# Patient Record
Sex: Female | Born: 1945
Health system: Southern US, Community
[De-identification: ages and names within clinical notes are randomized; demographics above are authoritative.]

## PROBLEM LIST (undated history)

## (undated) DIAGNOSIS — E7800 Pure hypercholesterolemia, unspecified: Secondary | ICD-10-CM

## (undated) DIAGNOSIS — R12 Heartburn: Secondary | ICD-10-CM

## (undated) DIAGNOSIS — K5901 Slow transit constipation: Secondary | ICD-10-CM

## (undated) DIAGNOSIS — F329 Major depressive disorder, single episode, unspecified: Secondary | ICD-10-CM

## (undated) DIAGNOSIS — L719 Rosacea, unspecified: Secondary | ICD-10-CM

## (undated) DIAGNOSIS — J301 Allergic rhinitis due to pollen: Secondary | ICD-10-CM

## (undated) DIAGNOSIS — E785 Hyperlipidemia, unspecified: Secondary | ICD-10-CM

## (undated) DIAGNOSIS — N3281 Overactive bladder: Secondary | ICD-10-CM

## (undated) DIAGNOSIS — R739 Hyperglycemia, unspecified: Secondary | ICD-10-CM

## (undated) DIAGNOSIS — R7303 Prediabetes: Secondary | ICD-10-CM

## (undated) DIAGNOSIS — L570 Actinic keratosis: Secondary | ICD-10-CM

## (undated) DIAGNOSIS — N390 Urinary tract infection, site not specified: Secondary | ICD-10-CM

## (undated) DIAGNOSIS — Z8616 Personal history of COVID-19: Secondary | ICD-10-CM

## (undated) DIAGNOSIS — K7581 Nonalcoholic steatohepatitis (NASH): Secondary | ICD-10-CM

## (undated) DIAGNOSIS — E538 Deficiency of other specified B group vitamins: Secondary | ICD-10-CM

## (undated) DIAGNOSIS — Z860100 Personal history of colon polyps, unspecified: Secondary | ICD-10-CM

## (undated) DIAGNOSIS — E559 Vitamin D deficiency, unspecified: Secondary | ICD-10-CM

## (undated) DIAGNOSIS — I4510 Unspecified right bundle-branch block: Secondary | ICD-10-CM

## (undated) DIAGNOSIS — K921 Melena: Secondary | ICD-10-CM

## (undated) DIAGNOSIS — F32A Depression, unspecified: Secondary | ICD-10-CM

## (undated) DIAGNOSIS — F411 Generalized anxiety disorder: Secondary | ICD-10-CM

## (undated) DIAGNOSIS — I679 Cerebrovascular disease, unspecified: Secondary | ICD-10-CM

## (undated) DIAGNOSIS — K219 Gastro-esophageal reflux disease without esophagitis: Secondary | ICD-10-CM

## (undated) DIAGNOSIS — M5431 Sciatica, right side: Secondary | ICD-10-CM

## (undated) DIAGNOSIS — F419 Anxiety disorder, unspecified: Secondary | ICD-10-CM

## (undated) HISTORY — DX: Personal history of colonic polyps: Z86.010

## (undated) HISTORY — DX: Personal history of COVID-19: Z86.16

## (undated) HISTORY — DX: Pure hypercholesterolemia, unspecified: E78.00

## (undated) HISTORY — DX: Personal history of colon polyps, unspecified: Z86.0100

## (undated) HISTORY — DX: Slow transit constipation: K59.01

## (undated) HISTORY — DX: Actinic keratosis: L57.0

## (undated) HISTORY — DX: Prediabetes: R73.03

## (undated) HISTORY — DX: Vitamin D deficiency, unspecified: E55.9

## (undated) HISTORY — DX: Gastro-esophageal reflux disease without esophagitis: K21.9

## (undated) HISTORY — DX: Rosacea, unspecified: L71.9

## (undated) HISTORY — PX: HYSTEROTOMY: SHX1776

## (undated) HISTORY — DX: Major depressive disorder, single episode, unspecified: F32.9

## (undated) HISTORY — DX: Nonalcoholic steatohepatitis (NASH): K75.81

## (undated) HISTORY — DX: Deficiency of other specified B group vitamins: E53.8

## (undated) HISTORY — DX: Overactive bladder: N32.81

## (undated) HISTORY — DX: Cerebrovascular disease, unspecified: I67.9

## (undated) HISTORY — DX: Hyperglycemia, unspecified: R73.9

## (undated) HISTORY — DX: Generalized anxiety disorder: F41.1

## (undated) HISTORY — DX: Allergic rhinitis due to pollen: J30.1

## (undated) HISTORY — DX: Heartburn: R12

## (undated) HISTORY — DX: Melena: K92.1

## (undated) HISTORY — DX: Sciatica, right side: M54.31

## (undated) HISTORY — DX: Hyperlipidemia, unspecified: E78.5

## (undated) HISTORY — DX: Unspecified right bundle-branch block: I45.10

---

## 2004-09-02 ENCOUNTER — Ambulatory Visit (HOSPITAL_COMMUNITY): Admission: RE | Admit: 2004-09-02 | Discharge: 2004-09-02 | Payer: Self-pay | Admitting: Family Medicine

## 2005-05-20 ENCOUNTER — Emergency Department (HOSPITAL_COMMUNITY): Admission: EM | Admit: 2005-05-20 | Discharge: 2005-05-20 | Payer: Self-pay | Admitting: Emergency Medicine

## 2005-05-21 ENCOUNTER — Encounter: Admission: RE | Admit: 2005-05-21 | Discharge: 2005-05-21 | Payer: Self-pay | Admitting: Emergency Medicine

## 2005-05-24 ENCOUNTER — Encounter: Admission: RE | Admit: 2005-05-24 | Discharge: 2005-05-24 | Payer: Self-pay | Admitting: Emergency Medicine

## 2005-05-27 ENCOUNTER — Ambulatory Visit (HOSPITAL_COMMUNITY): Admission: RE | Admit: 2005-05-27 | Discharge: 2005-05-27 | Payer: Self-pay | Admitting: Gastroenterology

## 2005-09-01 ENCOUNTER — Encounter: Admission: RE | Admit: 2005-09-01 | Discharge: 2005-09-01 | Payer: Self-pay | Admitting: Orthopedic Surgery

## 2005-12-20 ENCOUNTER — Ambulatory Visit (HOSPITAL_COMMUNITY): Admission: RE | Admit: 2005-12-20 | Discharge: 2005-12-20 | Payer: Self-pay | Admitting: Family Medicine

## 2006-01-05 ENCOUNTER — Encounter: Admission: RE | Admit: 2006-01-05 | Discharge: 2006-01-05 | Payer: Self-pay | Admitting: Family Medicine

## 2006-12-23 ENCOUNTER — Ambulatory Visit (HOSPITAL_COMMUNITY): Admission: RE | Admit: 2006-12-23 | Discharge: 2006-12-23 | Payer: Self-pay | Admitting: Family Medicine

## 2007-03-07 ENCOUNTER — Encounter: Admission: RE | Admit: 2007-03-07 | Discharge: 2007-03-07 | Payer: Self-pay | Admitting: Family Medicine

## 2008-01-12 ENCOUNTER — Ambulatory Visit (HOSPITAL_COMMUNITY): Admission: RE | Admit: 2008-01-12 | Discharge: 2008-01-12 | Payer: Self-pay

## 2009-01-15 ENCOUNTER — Ambulatory Visit (HOSPITAL_COMMUNITY): Admission: RE | Admit: 2009-01-15 | Discharge: 2009-01-15 | Payer: Self-pay | Admitting: Family Medicine

## 2010-01-19 ENCOUNTER — Ambulatory Visit (HOSPITAL_COMMUNITY): Admission: RE | Admit: 2010-01-19 | Discharge: 2010-01-19 | Payer: Self-pay | Admitting: Family Medicine

## 2010-09-30 ENCOUNTER — Other Ambulatory Visit: Payer: Self-pay | Admitting: Family Medicine

## 2010-09-30 DIAGNOSIS — Z1231 Encounter for screening mammogram for malignant neoplasm of breast: Secondary | ICD-10-CM

## 2010-11-04 ENCOUNTER — Other Ambulatory Visit: Payer: Self-pay | Admitting: Family Medicine

## 2010-11-06 ENCOUNTER — Ambulatory Visit
Admission: RE | Admit: 2010-11-06 | Discharge: 2010-11-06 | Disposition: A | Discharge status: Home / Self Care | Payer: PRIVATE HEALTH INSURANCE | Source: Ambulatory Visit | Attending: Family Medicine | Admitting: Family Medicine

## 2010-11-13 NOTE — Op Note (Signed)
[name], Diana Smith              ACCOUNT NO.:  [id number]   MEDICAL RECORD NO.:  [id number]          PATIENT TYPE:  AMB   LOCATION:  ENDO                         FACILITY:  [facility]   PHYSICIAN:  John C. Amedeo Plenty, M.D.    DATE OF BIRTH:  Jan 01, 1946   DATE OF PROCEDURE:  05/27/2005  DATE OF DISCHARGE:                                 OPERATIVE REPORT   PROCEDURE:  Endoscopic retrograde cholangiopancreatography with  sphincterotomy.   INDICATIONS FOR PROCEDURE:  A several day history of epigastric or abdominal  pain with CT scan showing a distal common bile duct stone near the ampulla.  She did have elevated liver function tests on presentation to the emergency  room on November 22.   DESCRIPTION OF PROCEDURE:  The patient was placed in the prone position then  placed on the pulse monitor with continuous low-flow oxygen delivered by  nasal cannula. She was sedated with 100 mcg IV fentanyl and 7 mg IV Versed.  The Olympus video side-viewing endoscope was advanced blindly into the  oropharynx, esophagus and stomach. The stomach looked grossly unremarkable,  the pylorus was traversed and Papilla of Vater located on the medial  duodenal wall. It had a normal appearance and was cannulated with the Alan Ripper sphincterotome with the aid of a guidewire. No pancreatogram was  obtained. A cholangiogram was obtained which showed a smooth modestly  dilated common bile duct with initial suggestion of a floating filling  defect that could not readily be kept in view while the dye was injected.  The dye did flow into the gallbladder and there were no obvious stones seen.  A generous sphincterotomy was made and a 12 mm balloon catheter was advanced  up into the common hepatic duct, inflated and dragged down. There appeared  to be a filling defect in front of the advancing balloon but no stone was  seen to be delivered and there was some air seen to come through the ampulla  endoscopically before the  balloon came through. Once this was done,  significant air was introduced into the biliary tree at that point. It could  not be readily cleared for the remainder of the procedure. More dye was  injected and two occlusion cholangiograms were obtained which showed no  obvious further filling defects. The balloon was swept through the duct 2 or  3 more times with a lot of air bubbles delivered but no stones seen. The  scope was then withdrawn and the patient returned to the recovery room in  stable condition. She tolerated the procedure well and there were no  immediate complications.   IMPRESSION:  1.  Mildly dilated common bile duct with initial suggestion of a filling      defect which could not be confirmed to be a stone status post empiric      sphincterotomy and balloon sweep.   PLAN:  Will recheck liver function test drawn prior to this procedure and  follow over time as well as follow the patient clinically for improvement.           ______________________________  John C. Amedeo Plenty, M.D.    JCH/MEDQ  D:  05/27/2005  T:  05/27/2005  Job:  [id number]   cc:   Zella Richer. Burnett Harry, M.D.  Fax: [phone]

## 2011-01-25 ENCOUNTER — Ambulatory Visit (HOSPITAL_COMMUNITY)
Admission: RE | Admit: 2011-01-25 | Discharge: 2011-01-25 | Disposition: A | Discharge status: Home / Self Care | Payer: PRIVATE HEALTH INSURANCE | Source: Ambulatory Visit | Attending: Family Medicine | Admitting: Family Medicine

## 2011-01-25 ENCOUNTER — Other Ambulatory Visit (HOSPITAL_COMMUNITY): Payer: Self-pay | Admitting: Family Medicine

## 2011-01-25 ENCOUNTER — Ambulatory Visit: Payer: Self-pay

## 2011-01-25 DIAGNOSIS — Z1231 Encounter for screening mammogram for malignant neoplasm of breast: Secondary | ICD-10-CM | POA: Insufficient documentation

## 2011-01-28 ENCOUNTER — Other Ambulatory Visit (HOSPITAL_COMMUNITY): Payer: Self-pay | Admitting: Gastroenterology

## 2011-01-28 DIAGNOSIS — R6881 Early satiety: Secondary | ICD-10-CM

## 2011-02-12 ENCOUNTER — Encounter (HOSPITAL_COMMUNITY)
Admission: RE | Admit: 2011-02-12 | Discharge: 2011-02-12 | Disposition: A | Discharge status: Home / Self Care | Payer: PRIVATE HEALTH INSURANCE | Source: Ambulatory Visit | Attending: Gastroenterology | Admitting: Gastroenterology

## 2011-02-12 DIAGNOSIS — R110 Nausea: Secondary | ICD-10-CM | POA: Insufficient documentation

## 2011-02-12 DIAGNOSIS — R109 Unspecified abdominal pain: Secondary | ICD-10-CM | POA: Insufficient documentation

## 2011-02-12 DIAGNOSIS — R6881 Early satiety: Secondary | ICD-10-CM | POA: Insufficient documentation

## 2011-02-12 MED ORDER — TECHNETIUM TC 99M SULFUR COLLOID
2.0000 | Freq: Once | INTRAVENOUS | Status: AC | PRN
  Administered 2011-02-12: 2 via INTRAVENOUS

## 2011-08-02 ENCOUNTER — Other Ambulatory Visit: Payer: Self-pay | Admitting: Family Medicine

## 2011-08-02 ENCOUNTER — Ambulatory Visit
Admission: RE | Admit: 2011-08-02 | Discharge: 2011-08-02 | Disposition: A | Discharge status: Home / Self Care | Payer: Medicare Other | Source: Ambulatory Visit | Attending: Family Medicine | Admitting: Family Medicine

## 2011-08-02 DIAGNOSIS — S0510XA Contusion of eyeball and orbital tissues, unspecified eye, initial encounter: Secondary | ICD-10-CM

## 2011-10-07 ENCOUNTER — Other Ambulatory Visit: Payer: Self-pay | Admitting: Orthopedic Surgery

## 2011-10-07 NOTE — Discharge Instructions (Signed)

## 2011-10-08 ENCOUNTER — Ambulatory Visit (HOSPITAL_BASED_OUTPATIENT_CLINIC_OR_DEPARTMENT_OTHER)
Admission: RE | Admit: 2011-10-08 | Discharge: 2011-10-08 | Disposition: A | Discharge status: Home / Self Care | Payer: Medicare Other | Source: Ambulatory Visit | Attending: Orthopedic Surgery | Admitting: Orthopedic Surgery

## 2011-10-08 ENCOUNTER — Encounter (HOSPITAL_BASED_OUTPATIENT_CLINIC_OR_DEPARTMENT_OTHER)
Admission: RE | Disposition: A | Discharge status: Home / Self Care | Payer: Self-pay | Source: Ambulatory Visit | Attending: Orthopedic Surgery

## 2011-10-08 DIAGNOSIS — B079 Viral wart, unspecified: Secondary | ICD-10-CM | POA: Insufficient documentation

## 2011-10-08 HISTORY — PX: MASS EXCISION: SHX2000

## 2011-10-08 SURGERY — MINOR EXCISION OF MASS
Anesthesia: LOCAL | Site: Hand | Laterality: Left | Wound class: Clean

## 2011-10-08 MED ORDER — CHLORHEXIDINE GLUCONATE 4 % EX LIQD: 60.0000 mL | Freq: Once | CUTANEOUS | Status: DC

## 2011-10-08 MED ORDER — LIDOCAINE HCL 2 % IJ SOLN
INTRAMUSCULAR | Status: DC | PRN
  Administered 2011-10-08: 2 mL

## 2011-10-08 SURGICAL SUPPLY — 36 items
BANDAGE ADHESIVE 1X3 (GAUZE/BANDAGES/DRESSINGS) IMPLANT
BLADE SURG 15 STRL LF DISP TIS (BLADE) ×1 IMPLANT
BLADE SURG 15 STRL SS (BLADE) ×2
BNDG CMPR 9X4 STRL LF SNTH (GAUZE/BANDAGES/DRESSINGS) ×1
BNDG CMPR MD 5X2 ELC HKLP STRL (GAUZE/BANDAGES/DRESSINGS)
BNDG COHESIVE 1X5 TAN STRL LF (GAUZE/BANDAGES/DRESSINGS) IMPLANT
BNDG ELASTIC 2 VLCR STRL LF (GAUZE/BANDAGES/DRESSINGS) IMPLANT
BNDG ESMARK 4X9 LF (GAUZE/BANDAGES/DRESSINGS) ×1 IMPLANT
BRUSH SCRUB EZ PLAIN DRY (MISCELLANEOUS) ×2 IMPLANT
CLOTH BEACON ORANGE TIMEOUT ST (SAFETY) ×2 IMPLANT
CORDS BIPOLAR (ELECTRODE) ×1 IMPLANT
COVER MAYO STAND STRL (DRAPES) ×2 IMPLANT
CUFF TOURNIQUET SINGLE 18IN (TOURNIQUET CUFF) IMPLANT
DECANTER SPIKE VIAL GLASS SM (MISCELLANEOUS) ×1 IMPLANT
DRAIN PENROSE 1/2X12 LTX STRL (WOUND CARE) IMPLANT
DRAPE SURG 17X23 STRL (DRAPES) ×2 IMPLANT
GAUZE SPONGE 4X4 12PLY STRL LF (GAUZE/BANDAGES/DRESSINGS) ×4 IMPLANT
GAUZE XEROFORM 1X8 LF (GAUZE/BANDAGES/DRESSINGS) ×1 IMPLANT
GLOVE BIO SURGEON STRL SZ 6.5 (GLOVE) ×2 IMPLANT
GLOVE BIOGEL M STRL SZ7.5 (GLOVE) ×2 IMPLANT
GLOVE ORTHO TXT STRL SZ7.5 (GLOVE) ×2 IMPLANT
GOWN PREVENTION PLUS XLARGE (GOWN DISPOSABLE) ×4 IMPLANT
NEEDLE 27GAX1X1/2 (NEEDLE) ×1 IMPLANT
PACK BASIN DAY SURGERY FS (CUSTOM PROCEDURE TRAY) ×2 IMPLANT
PADDING CAST ABS 4INX4YD NS (CAST SUPPLIES) ×1
PADDING CAST ABS COTTON 4X4 ST (CAST SUPPLIES) ×1 IMPLANT
SPONGE GAUZE 4X4 12PLY (GAUZE/BANDAGES/DRESSINGS) ×2 IMPLANT
STOCKINETTE 4X48 STRL (DRAPES) ×2 IMPLANT
SUT ETHILON 5 0 P 3 18 (SUTURE) ×1
SUT NYLON ETHILON 5-0 P-3 1X18 (SUTURE) ×1 IMPLANT
SYR 3ML 23GX1 SAFETY (SYRINGE) IMPLANT
SYR CONTROL 10ML LL (SYRINGE) ×1 IMPLANT
TOWEL OR 17X24 6PK STRL BLUE (TOWEL DISPOSABLE) ×4 IMPLANT
TRAY DSU PREP LF (CUSTOM PROCEDURE TRAY) ×2 IMPLANT
UNDERPAD 30X30 INCONTINENT (UNDERPADS AND DIAPERS) ×2 IMPLANT
WATER STERILE IRR 1000ML POUR (IV SOLUTION) ×2 IMPLANT

## 2011-10-08 NOTE — Brief Op Note (Signed)
10/08/2011  12:02 PM  PATIENT:  Diana Smith  66 y.o. female  PRE-OPERATIVE DIAGNOSIS:  mass dorsal left long metacarpal phalangeal joint  POST-OPERATIVE DIAGNOSIS:  rule out keratoacanthoma  PROCEDURE: WIDE MARGINAL EXCISION OF CUTANEOUS MASS RIGHT DORSAL HAND  SURGEON: Cammie Sickle., MD   PHYSICIAN ASSISTANT:   ASSISTANTS: Kathyrn Sheriff.A-C    ANESTHESIA:   local  EBL:     BLOOD ADMINISTERED:none  DRAINS: none   LOCAL MEDICATIONS USED:  XYLOCAINE   SPECIMEN:  Excision  DISPOSITION OF SPECIMEN:  PATHOLOGY  COUNTS:  YES  TOURNIQUET:   Total Tourniquet Time Documented: Forearm (Left) - 4 minutes  DICTATION: .Other Dictation: Dictation Number [id number]  PLAN OF CARE: Discharge to home after PACU  PATIENT DISPOSITION:  PACU - hemodynamically stable.

## 2011-10-08 NOTE — H&P (Signed)
  Patient is a 66 year old right-hand-dominant female who presented to our office on 10/06/2011 for evaluation and treatment of an enlarging mass on the dorsal aspect of her left long finger MCP joint. She stated that she first noted this in early January of 2013. It started as what appeared to be a small pimple and then eventually open. She states that she had occasional of bleeding from it. If she hit it against something at her home. She was seen by her primary care physician and referred to our office for further evaluation and treatment. Review of her past medical history reveals allergies to Dynacin and Lorabid. Review of systems reveals that she wears contact lenses. Current medications include Paxil 30 mg per day and BuSpar 35 mg tablets per day. Past surgical history reveals that surgery, E. September 1995, hysterectomy in April of 1999. He's had no adverse reactions to anesthesia. Family history reveals no history of heart disease. There is history of hypertension, and arthritis. Social history reveals patient does not smoke or consume alcohol. Based beverages. She is married and lives with her husband in [place], [place]. Physical examination reveals a 1 cm mass, dorsum of her right long finger MCP joint. Does not appear to be infected. She has full range of motion of her fingers and wrists.Neurovascular exam is intact.  Impression: Possible filiform verrucous vulgaris vs. Possible early skin cancer.  Plan:  To OR for excisional biopsy of mass dorsum left long finger MCP joint. The procedure, risks and post-op course were discussed with the patient at length and she was in agreement with the plan.  Ronnae Kaser Dasnoit PA-C  H&P documentation: 10/08/2011  -History and Physical Reviewed  -Patient has been re-examined  -No change in the plan of care  Cammie Sickle, MD

## 2011-10-08 NOTE — Op Note (Signed)
OP NOTE DICTATED [id number]

## 2011-10-11 ENCOUNTER — Encounter (HOSPITAL_BASED_OUTPATIENT_CLINIC_OR_DEPARTMENT_OTHER): Payer: Self-pay | Admitting: Orthopedic Surgery

## 2011-10-11 NOTE — Op Note (Signed)
NAME:  Diana Smith, Diana Smith                    ACCOUNT NO.:  MEDICAL RECORD NO.:  [id number]  LOCATION:                                 FACILITY:  PHYSICIAN:  Youlanda Mighty. Arilla Hice, M.D.      DATE OF BIRTH:  DATE OF PROCEDURE:  10/08/2011 DATE OF DISCHARGE:                              OPERATIVE REPORT   PREOPERATIVE DIAGNOSIS:  Enlarging cutaneous mass, left hand over the dorsal aspect of long finger metacarpophalangeal joint.  POSTOPERATIVE DIAGNOSIS:  Enlarging cutaneous mass, left hand over the dorsal aspect of long finger metacarpophalangeal joint.  OPERATION:  Wide marginal resection of cutaneous mass with primary skin closure.  OPERATING SURGEON:  Youlanda Mighty. Aspasia Rude, MD  ASSISTANT:  Marily Lente Dasnoit, PA-C  ANESTHESIA:  2% lidocaine field block, this was performed as a minor operating room procedure.  INDICATIONS:  Diana Smith is a 66 year old woman self referred for evaluation of a cutaneous mass that has been growing rapidly during the past 2 months, just 1 cm in diameter, hyperkeratotic, exophytic mass that had been bleeding.  She presented for evaluation.  This had the appearance of a possible keratoacanthoma or squamous cell skin cancer. Therefore, we recommended wide marginal resection for diagnosis and hopeful resolution. After informed consent, she was brought to the operating room at this time.  PROCEDURE:  Diana Smith was brought to room #1 of the [facility] and placed in supine position on the operating table.  Following informed consent and Betadine prep, 2% lidocaine was infiltrated in the path of the intended incision and margins.  After 5 minutes, excellent anesthesia was achieved.  The left hand and arm were then prepped with Betadine soap solution and sterilely draped.  A pneumatic tourniquet was applied to the proximal forearm.  Following routine surgical time-out, the hand was exsanguinated as was the forearm followed by inflation of the  arterial tourniquet to 220 mmHg.  A wide marginal resection was planned with 5-mm margins.  The skin margins were taken sharply and the mass and margins were excised en bloc.  This was placed in formalin for pathologic evaluation.  Skin margins were undermined followed by layered closure with subcutaneous 4-0 Vicryl and intradermal 3-0 Prolene with Steri-Strips.  The voluminous gauze dressing was applied with Ace wrap.  For aftercare, Ms. Kass was provided prescription for Vicodin 5 mg 1 p.o. q.4-6 hours p.r.n. pain, 20 tabs without refill.  We will see her back in followup in our office in a week for follow up or sooner p.r.n. problems.     Youlanda Mighty Jonus Coble, M.D.     RVS/MEDQ  D:  10/08/2011  T:  10/08/2011  Job:  [id number]

## 2012-01-07 ENCOUNTER — Other Ambulatory Visit (HOSPITAL_COMMUNITY): Payer: Self-pay | Admitting: Family Medicine

## 2012-01-07 DIAGNOSIS — Z1231 Encounter for screening mammogram for malignant neoplasm of breast: Secondary | ICD-10-CM

## 2012-02-01 ENCOUNTER — Ambulatory Visit (HOSPITAL_COMMUNITY)
Admission: RE | Admit: 2012-02-01 | Discharge: 2012-02-01 | Disposition: A | Discharge status: Home / Self Care | Payer: Medicare Other | Source: Ambulatory Visit | Attending: Family Medicine | Admitting: Family Medicine

## 2012-02-01 DIAGNOSIS — Z1231 Encounter for screening mammogram for malignant neoplasm of breast: Secondary | ICD-10-CM | POA: Insufficient documentation

## 2013-01-10 ENCOUNTER — Other Ambulatory Visit: Payer: Self-pay | Admitting: Gastroenterology

## 2013-01-10 DIAGNOSIS — R109 Unspecified abdominal pain: Secondary | ICD-10-CM

## 2013-01-11 ENCOUNTER — Ambulatory Visit
Admission: RE | Admit: 2013-01-11 | Discharge: 2013-01-11 | Disposition: A | Discharge status: Home / Self Care | Payer: Medicare Other | Source: Ambulatory Visit | Attending: Gastroenterology | Admitting: Gastroenterology

## 2013-01-11 DIAGNOSIS — R109 Unspecified abdominal pain: Secondary | ICD-10-CM

## 2013-02-06 ENCOUNTER — Other Ambulatory Visit (HOSPITAL_COMMUNITY): Payer: Self-pay | Admitting: Family Medicine

## 2013-02-06 DIAGNOSIS — Z1231 Encounter for screening mammogram for malignant neoplasm of breast: Secondary | ICD-10-CM

## 2013-02-09 ENCOUNTER — Ambulatory Visit (HOSPITAL_COMMUNITY)
Admission: RE | Admit: 2013-02-09 | Discharge: 2013-02-09 | Disposition: A | Discharge status: Home / Self Care | Payer: Medicare Other | Source: Ambulatory Visit | Attending: Family Medicine | Admitting: Family Medicine

## 2013-02-09 DIAGNOSIS — Z1231 Encounter for screening mammogram for malignant neoplasm of breast: Secondary | ICD-10-CM | POA: Insufficient documentation

## 2014-03-28 ENCOUNTER — Other Ambulatory Visit: Payer: Self-pay | Admitting: Dermatology

## 2014-04-15 ENCOUNTER — Other Ambulatory Visit (HOSPITAL_COMMUNITY): Payer: Self-pay | Admitting: Family Medicine

## 2014-04-15 DIAGNOSIS — Z1231 Encounter for screening mammogram for malignant neoplasm of breast: Secondary | ICD-10-CM

## 2014-05-09 ENCOUNTER — Ambulatory Visit (HOSPITAL_COMMUNITY)
Admission: RE | Admit: 2014-05-09 | Discharge: 2014-05-09 | Disposition: A | Discharge status: Home / Self Care | Payer: Medicare HMO | Source: Ambulatory Visit | Attending: Family Medicine | Admitting: Family Medicine

## 2014-05-09 DIAGNOSIS — Z1231 Encounter for screening mammogram for malignant neoplasm of breast: Secondary | ICD-10-CM | POA: Insufficient documentation

## 2014-10-24 ENCOUNTER — Ambulatory Visit
Admission: RE | Admit: 2014-10-24 | Discharge: 2014-10-24 | Disposition: A | Discharge status: Home / Self Care | Payer: Commercial Managed Care - HMO | Source: Ambulatory Visit | Attending: Family Medicine | Admitting: Family Medicine

## 2014-10-24 ENCOUNTER — Other Ambulatory Visit: Payer: Self-pay | Admitting: Family Medicine

## 2014-10-24 DIAGNOSIS — S6990XA Unspecified injury of unspecified wrist, hand and finger(s), initial encounter: Secondary | ICD-10-CM

## 2014-10-24 DIAGNOSIS — R109 Unspecified abdominal pain: Secondary | ICD-10-CM | POA: Diagnosis not present

## 2014-10-24 DIAGNOSIS — Z87828 Personal history of other (healed) physical injury and trauma: Secondary | ICD-10-CM | POA: Diagnosis not present

## 2014-10-24 DIAGNOSIS — R197 Diarrhea, unspecified: Secondary | ICD-10-CM | POA: Diagnosis not present

## 2014-10-24 DIAGNOSIS — M19031 Primary osteoarthritis, right wrist: Secondary | ICD-10-CM | POA: Diagnosis not present

## 2014-10-24 DIAGNOSIS — M25531 Pain in right wrist: Secondary | ICD-10-CM | POA: Diagnosis not present

## 2014-12-23 DIAGNOSIS — M5489 Other dorsalgia: Secondary | ICD-10-CM | POA: Diagnosis not present

## 2014-12-23 DIAGNOSIS — R3915 Urgency of urination: Secondary | ICD-10-CM | POA: Diagnosis not present

## 2014-12-23 DIAGNOSIS — R350 Frequency of micturition: Secondary | ICD-10-CM | POA: Diagnosis not present

## 2014-12-31 DIAGNOSIS — W06XXXA Fall from bed, initial encounter: Secondary | ICD-10-CM | POA: Diagnosis not present

## 2014-12-31 DIAGNOSIS — S0083XA Contusion of other part of head, initial encounter: Secondary | ICD-10-CM | POA: Diagnosis not present

## 2015-01-01 ENCOUNTER — Ambulatory Visit
Admission: RE | Admit: 2015-01-01 | Discharge: 2015-01-01 | Disposition: A | Discharge status: Home / Self Care | Payer: Commercial Managed Care - HMO | Source: Ambulatory Visit | Attending: Family Medicine | Admitting: Family Medicine

## 2015-01-01 ENCOUNTER — Other Ambulatory Visit: Payer: Self-pay | Admitting: Family Medicine

## 2015-01-01 DIAGNOSIS — S0083XA Contusion of other part of head, initial encounter: Secondary | ICD-10-CM

## 2015-01-01 DIAGNOSIS — M1711 Unilateral primary osteoarthritis, right knee: Secondary | ICD-10-CM | POA: Diagnosis not present

## 2015-01-01 DIAGNOSIS — M79644 Pain in right finger(s): Secondary | ICD-10-CM | POA: Diagnosis not present

## 2015-07-23 ENCOUNTER — Other Ambulatory Visit: Payer: Self-pay

## 2015-07-23 DIAGNOSIS — Z1231 Encounter for screening mammogram for malignant neoplasm of breast: Secondary | ICD-10-CM

## 2015-08-08 ENCOUNTER — Ambulatory Visit: Payer: Commercial Managed Care - HMO

## 2015-08-20 ENCOUNTER — Ambulatory Visit
Admission: RE | Admit: 2015-08-20 | Discharge: 2015-08-20 | Disposition: A | Discharge status: Home / Self Care | Payer: PPO | Source: Ambulatory Visit

## 2015-08-20 DIAGNOSIS — Z1231 Encounter for screening mammogram for malignant neoplasm of breast: Secondary | ICD-10-CM

## 2015-11-20 DIAGNOSIS — F0390 Unspecified dementia without behavioral disturbance: Secondary | ICD-10-CM | POA: Diagnosis not present

## 2015-11-20 DIAGNOSIS — Z79899 Other long term (current) drug therapy: Secondary | ICD-10-CM | POA: Diagnosis not present

## 2015-11-20 DIAGNOSIS — E7800 Pure hypercholesterolemia, unspecified: Secondary | ICD-10-CM | POA: Diagnosis not present

## 2015-11-20 DIAGNOSIS — E559 Vitamin D deficiency, unspecified: Secondary | ICD-10-CM | POA: Diagnosis not present

## 2015-11-20 DIAGNOSIS — K219 Gastro-esophageal reflux disease without esophagitis: Secondary | ICD-10-CM | POA: Diagnosis not present

## 2015-11-20 DIAGNOSIS — F324 Major depressive disorder, single episode, in partial remission: Secondary | ICD-10-CM | POA: Diagnosis not present

## 2015-12-03 DIAGNOSIS — E538 Deficiency of other specified B group vitamins: Secondary | ICD-10-CM | POA: Diagnosis not present

## 2015-12-16 DIAGNOSIS — E538 Deficiency of other specified B group vitamins: Secondary | ICD-10-CM | POA: Diagnosis not present

## 2016-01-20 DIAGNOSIS — E538 Deficiency of other specified B group vitamins: Secondary | ICD-10-CM | POA: Diagnosis not present

## 2016-01-23 DIAGNOSIS — M25571 Pain in right ankle and joints of right foot: Secondary | ICD-10-CM | POA: Diagnosis not present

## 2016-03-02 DIAGNOSIS — E559 Vitamin D deficiency, unspecified: Secondary | ICD-10-CM | POA: Diagnosis not present

## 2016-05-24 DIAGNOSIS — L821 Other seborrheic keratosis: Secondary | ICD-10-CM | POA: Diagnosis not present

## 2016-05-24 DIAGNOSIS — L814 Other melanin hyperpigmentation: Secondary | ICD-10-CM | POA: Diagnosis not present

## 2016-05-24 DIAGNOSIS — D225 Melanocytic nevi of trunk: Secondary | ICD-10-CM | POA: Diagnosis not present

## 2016-05-24 DIAGNOSIS — Z85828 Personal history of other malignant neoplasm of skin: Secondary | ICD-10-CM | POA: Diagnosis not present

## 2016-05-27 DIAGNOSIS — D126 Benign neoplasm of colon, unspecified: Secondary | ICD-10-CM | POA: Diagnosis not present

## 2016-05-27 DIAGNOSIS — Z86010 Personal history of colonic polyps: Secondary | ICD-10-CM | POA: Diagnosis not present

## 2016-05-27 DIAGNOSIS — D122 Benign neoplasm of ascending colon: Secondary | ICD-10-CM | POA: Diagnosis not present

## 2016-06-01 DIAGNOSIS — D126 Benign neoplasm of colon, unspecified: Secondary | ICD-10-CM | POA: Diagnosis not present

## 2016-07-20 DIAGNOSIS — M545 Low back pain: Secondary | ICD-10-CM | POA: Diagnosis not present

## 2016-07-20 DIAGNOSIS — R6889 Other general symptoms and signs: Secondary | ICD-10-CM | POA: Diagnosis not present

## 2016-12-09 DIAGNOSIS — J301 Allergic rhinitis due to pollen: Secondary | ICD-10-CM | POA: Diagnosis not present

## 2016-12-09 DIAGNOSIS — L659 Nonscarring hair loss, unspecified: Secondary | ICD-10-CM | POA: Diagnosis not present

## 2016-12-09 DIAGNOSIS — F419 Anxiety disorder, unspecified: Secondary | ICD-10-CM | POA: Diagnosis not present

## 2017-03-03 DIAGNOSIS — B850 Pediculosis due to Pediculus humanus capitis: Secondary | ICD-10-CM | POA: Diagnosis not present

## 2017-03-03 DIAGNOSIS — Z23 Encounter for immunization: Secondary | ICD-10-CM | POA: Diagnosis not present

## 2017-04-04 ENCOUNTER — Other Ambulatory Visit: Payer: Self-pay | Admitting: Family Medicine

## 2017-04-04 DIAGNOSIS — Z1239 Encounter for other screening for malignant neoplasm of breast: Secondary | ICD-10-CM

## 2017-04-21 ENCOUNTER — Ambulatory Visit
Admission: RE | Admit: 2017-04-21 | Discharge: 2017-04-21 | Disposition: A | Discharge status: Home / Self Care | Payer: PPO | Source: Ambulatory Visit | Attending: Family Medicine | Admitting: Family Medicine

## 2017-04-21 DIAGNOSIS — Z1239 Encounter for other screening for malignant neoplasm of breast: Secondary | ICD-10-CM

## 2017-04-21 DIAGNOSIS — Z1231 Encounter for screening mammogram for malignant neoplasm of breast: Secondary | ICD-10-CM | POA: Diagnosis not present

## 2017-05-24 DIAGNOSIS — D692 Other nonthrombocytopenic purpura: Secondary | ICD-10-CM | POA: Diagnosis not present

## 2017-05-24 DIAGNOSIS — D229 Melanocytic nevi, unspecified: Secondary | ICD-10-CM | POA: Diagnosis not present

## 2017-05-24 DIAGNOSIS — L821 Other seborrheic keratosis: Secondary | ICD-10-CM | POA: Diagnosis not present

## 2017-05-24 DIAGNOSIS — L814 Other melanin hyperpigmentation: Secondary | ICD-10-CM | POA: Diagnosis not present

## 2017-07-04 DIAGNOSIS — F324 Major depressive disorder, single episode, in partial remission: Secondary | ICD-10-CM | POA: Diagnosis not present

## 2017-07-04 DIAGNOSIS — K219 Gastro-esophageal reflux disease without esophagitis: Secondary | ICD-10-CM | POA: Diagnosis not present

## 2017-09-01 DIAGNOSIS — H524 Presbyopia: Secondary | ICD-10-CM | POA: Diagnosis not present

## 2017-09-01 DIAGNOSIS — Z0100 Encounter for examination of eyes and vision without abnormal findings: Secondary | ICD-10-CM | POA: Diagnosis not present

## 2017-10-07 DIAGNOSIS — W06XXXA Fall from bed, initial encounter: Secondary | ICD-10-CM | POA: Diagnosis not present

## 2017-10-07 DIAGNOSIS — S0011XA Contusion of right eyelid and periocular area, initial encounter: Secondary | ICD-10-CM | POA: Diagnosis not present

## 2017-10-07 DIAGNOSIS — J309 Allergic rhinitis, unspecified: Secondary | ICD-10-CM | POA: Diagnosis not present

## 2017-10-07 DIAGNOSIS — S0081XA Abrasion of other part of head, initial encounter: Secondary | ICD-10-CM | POA: Diagnosis not present

## 2017-11-08 DIAGNOSIS — G479 Sleep disorder, unspecified: Secondary | ICD-10-CM | POA: Diagnosis not present

## 2017-11-08 DIAGNOSIS — I4510 Unspecified right bundle-branch block: Secondary | ICD-10-CM | POA: Diagnosis not present

## 2017-11-08 DIAGNOSIS — E559 Vitamin D deficiency, unspecified: Secondary | ICD-10-CM | POA: Diagnosis not present

## 2017-11-08 DIAGNOSIS — Z0000 Encounter for general adult medical examination without abnormal findings: Secondary | ICD-10-CM | POA: Diagnosis not present

## 2017-11-08 DIAGNOSIS — Z23 Encounter for immunization: Secondary | ICD-10-CM | POA: Diagnosis not present

## 2017-11-08 DIAGNOSIS — E7800 Pure hypercholesterolemia, unspecified: Secondary | ICD-10-CM | POA: Diagnosis not present

## 2017-11-08 DIAGNOSIS — Z1159 Encounter for screening for other viral diseases: Secondary | ICD-10-CM | POA: Diagnosis not present

## 2017-11-08 DIAGNOSIS — E538 Deficiency of other specified B group vitamins: Secondary | ICD-10-CM | POA: Diagnosis not present

## 2017-11-08 DIAGNOSIS — Z79899 Other long term (current) drug therapy: Secondary | ICD-10-CM | POA: Diagnosis not present

## 2017-11-15 DIAGNOSIS — S93504A Unspecified sprain of right lesser toe(s), initial encounter: Secondary | ICD-10-CM | POA: Diagnosis not present

## 2017-11-15 DIAGNOSIS — J069 Acute upper respiratory infection, unspecified: Secondary | ICD-10-CM | POA: Diagnosis not present

## 2017-11-25 ENCOUNTER — Encounter: Payer: Self-pay | Admitting: Family Medicine

## 2017-11-25 DIAGNOSIS — R7303 Prediabetes: Secondary | ICD-10-CM | POA: Diagnosis not present

## 2017-11-25 DIAGNOSIS — E538 Deficiency of other specified B group vitamins: Secondary | ICD-10-CM | POA: Diagnosis not present

## 2018-01-09 DIAGNOSIS — E2839 Other primary ovarian failure: Secondary | ICD-10-CM | POA: Diagnosis not present

## 2018-01-09 DIAGNOSIS — M8588 Other specified disorders of bone density and structure, other site: Secondary | ICD-10-CM | POA: Diagnosis not present

## 2018-01-31 ENCOUNTER — Encounter: Payer: PPO | Attending: Family Medicine | Admitting: Nutrition

## 2018-01-31 VITALS — Ht 65.5 in | Wt 174.0 lb

## 2018-01-31 DIAGNOSIS — R739 Hyperglycemia, unspecified: Secondary | ICD-10-CM

## 2018-01-31 NOTE — Progress Notes (Signed)
  Medical Nutrition Therapy:  Appt start time: 0930 end time:  1030.   Assessment:  Primary concerns today: .Prediabetes.  PMH: HTN,Depression, GERD and low VIt D and possible low B12?  She notes her A1C was elevated but I don't have her labs. BMI  Is 28.5. She would like to weigh around 150 lbs. LIves with her husband. Wants to lose weight. Eats 2-3 meals per day. Usually skips breakfast and eats lunch and larger dinner and then snacks in late afternoon and at night. Not physically active. Cooks some meals at home and eats out for convenience. Current diet exceeds her needs and contributes to weight gain and elevated blood sugars.  Willing to make changes with her diet and exercise to improve her health.   Preferred Learning Style:   No preference indicated   Learning Readiness:  Ready  Change in progress   MEDICATIONS:   DIETARY INTAKE:  24-hr recall:  B ( AM):  Sausage and egg biscuit 1;  Or skips Snk ( AM):  L ( PM): misc.  skips Snk ( PM):  D ( PM): spaghetti,  Salad, Water or unsweet tea Snk ( PM):  Cereal or fruit Beverages: crystal light,   Usual physical activity: ADL  Estimated energy needs: 1200  calories 135 g carbohydrates 90 g protein 33 g fat  Progress Towards Goal(s):  In progress.   Nutritional Diagnosis:  NB-1.1 Food and nutrition-related knowledge deficit As related to Prediabetes and overweight.  As evidenced by Elevated blood sugar and BMI of 28.    Intervention:  Nutrition and PreDiabetes education provided on My Plate, CHO counting, meal planning, portion sizes, timing of meals, avoiding snacks between meals unless having a low blood sugar, target ranges for A1C and blood sugars, signs/symptoms and treatment of hyper/hypoglycemia, monitoring blood sugars, taking medications as prescribed, benefits of exercising 30 minutes per day and prevention of complications of DM.  Goals 1. Follow MY Plate 2. Eat three meals per day 3. Don't skip meals. 4.  Walk 30 minutes a day 5. Cut down on fast foods Drink only water and cut out crystal light. Teaching Method Utilized:  Visual Auditory Hands on  Handouts given during visit include:  The Plate Method  Meal Plan Card   Barriers to learning/adherence to lifestyle change: none  Demonstrated degree of understanding via:  Teach Back   Monitoring/Evaluation:  Dietary intake, exercise, meal planning , and body weight in 1 month(s).

## 2018-01-31 NOTE — Patient Instructions (Signed)
Goals 1. Follow MY Plate 2. Eat three meals per day 3. Don't skip meals. 4. Walk 30 minutes a day 5. Cut down on fast foods Drink only water and cut out crystal light.  Marland Kitchen

## 2018-02-01 ENCOUNTER — Encounter: Payer: Self-pay | Admitting: Nutrition

## 2018-02-22 ENCOUNTER — Encounter: Payer: Self-pay | Admitting: Nutrition

## 2018-02-22 DIAGNOSIS — E559 Vitamin D deficiency, unspecified: Secondary | ICD-10-CM | POA: Diagnosis not present

## 2018-02-22 DIAGNOSIS — E538 Deficiency of other specified B group vitamins: Secondary | ICD-10-CM | POA: Diagnosis not present

## 2018-04-28 DIAGNOSIS — Z23 Encounter for immunization: Secondary | ICD-10-CM | POA: Diagnosis not present

## 2018-05-03 ENCOUNTER — Ambulatory Visit: Payer: PPO | Admitting: Nutrition

## 2018-06-18 DIAGNOSIS — J0100 Acute maxillary sinusitis, unspecified: Secondary | ICD-10-CM | POA: Diagnosis not present

## 2018-06-18 DIAGNOSIS — J069 Acute upper respiratory infection, unspecified: Secondary | ICD-10-CM | POA: Diagnosis not present

## 2018-07-01 DIAGNOSIS — J0100 Acute maxillary sinusitis, unspecified: Secondary | ICD-10-CM | POA: Diagnosis not present

## 2018-07-01 DIAGNOSIS — B349 Viral infection, unspecified: Secondary | ICD-10-CM | POA: Diagnosis not present

## 2018-07-06 DIAGNOSIS — L821 Other seborrheic keratosis: Secondary | ICD-10-CM | POA: Diagnosis not present

## 2018-07-06 DIAGNOSIS — L578 Other skin changes due to chronic exposure to nonionizing radiation: Secondary | ICD-10-CM | POA: Diagnosis not present

## 2018-07-06 DIAGNOSIS — D225 Melanocytic nevi of trunk: Secondary | ICD-10-CM | POA: Diagnosis not present

## 2018-07-06 DIAGNOSIS — L218 Other seborrheic dermatitis: Secondary | ICD-10-CM | POA: Diagnosis not present

## 2018-07-06 DIAGNOSIS — Z85828 Personal history of other malignant neoplasm of skin: Secondary | ICD-10-CM | POA: Diagnosis not present

## 2018-07-06 DIAGNOSIS — L84 Corns and callosities: Secondary | ICD-10-CM | POA: Diagnosis not present

## 2018-08-08 DIAGNOSIS — H6121 Impacted cerumen, right ear: Secondary | ICD-10-CM | POA: Diagnosis not present

## 2018-08-08 DIAGNOSIS — H938X9 Other specified disorders of ear, unspecified ear: Secondary | ICD-10-CM | POA: Diagnosis not present

## 2018-08-08 DIAGNOSIS — R0981 Nasal congestion: Secondary | ICD-10-CM | POA: Diagnosis not present

## 2018-08-10 DIAGNOSIS — F324 Major depressive disorder, single episode, in partial remission: Secondary | ICD-10-CM | POA: Diagnosis not present

## 2018-08-10 DIAGNOSIS — K219 Gastro-esophageal reflux disease without esophagitis: Secondary | ICD-10-CM | POA: Diagnosis not present

## 2018-08-10 DIAGNOSIS — Z23 Encounter for immunization: Secondary | ICD-10-CM | POA: Diagnosis not present

## 2018-11-22 DIAGNOSIS — H5210 Myopia, unspecified eye: Secondary | ICD-10-CM | POA: Diagnosis not present

## 2018-12-19 DIAGNOSIS — Z0000 Encounter for general adult medical examination without abnormal findings: Secondary | ICD-10-CM | POA: Diagnosis not present

## 2018-12-19 DIAGNOSIS — Z23 Encounter for immunization: Secondary | ICD-10-CM | POA: Diagnosis not present

## 2018-12-19 DIAGNOSIS — E7800 Pure hypercholesterolemia, unspecified: Secondary | ICD-10-CM | POA: Diagnosis not present

## 2018-12-19 DIAGNOSIS — R7303 Prediabetes: Secondary | ICD-10-CM | POA: Diagnosis not present

## 2018-12-19 DIAGNOSIS — E538 Deficiency of other specified B group vitamins: Secondary | ICD-10-CM | POA: Diagnosis not present

## 2018-12-19 DIAGNOSIS — Z79899 Other long term (current) drug therapy: Secondary | ICD-10-CM | POA: Diagnosis not present

## 2018-12-19 DIAGNOSIS — E559 Vitamin D deficiency, unspecified: Secondary | ICD-10-CM | POA: Diagnosis not present

## 2018-12-19 DIAGNOSIS — F411 Generalized anxiety disorder: Secondary | ICD-10-CM | POA: Diagnosis not present

## 2019-01-01 DIAGNOSIS — R55 Syncope and collapse: Secondary | ICD-10-CM | POA: Diagnosis not present

## 2019-01-01 DIAGNOSIS — F324 Major depressive disorder, single episode, in partial remission: Secondary | ICD-10-CM | POA: Diagnosis not present

## 2019-01-01 DIAGNOSIS — R7303 Prediabetes: Secondary | ICD-10-CM | POA: Diagnosis not present

## 2019-01-23 ENCOUNTER — Other Ambulatory Visit: Payer: Self-pay | Admitting: Family Medicine

## 2019-01-23 DIAGNOSIS — Z1231 Encounter for screening mammogram for malignant neoplasm of breast: Secondary | ICD-10-CM

## 2019-02-07 DIAGNOSIS — R319 Hematuria, unspecified: Secondary | ICD-10-CM | POA: Diagnosis not present

## 2019-02-07 DIAGNOSIS — N390 Urinary tract infection, site not specified: Secondary | ICD-10-CM | POA: Diagnosis not present

## 2019-02-20 DIAGNOSIS — R300 Dysuria: Secondary | ICD-10-CM | POA: Diagnosis not present

## 2019-02-20 DIAGNOSIS — N390 Urinary tract infection, site not specified: Secondary | ICD-10-CM | POA: Diagnosis not present

## 2019-02-26 DIAGNOSIS — R682 Dry mouth, unspecified: Secondary | ICD-10-CM | POA: Diagnosis not present

## 2019-02-26 DIAGNOSIS — N3000 Acute cystitis without hematuria: Secondary | ICD-10-CM | POA: Diagnosis not present

## 2019-02-26 DIAGNOSIS — Z23 Encounter for immunization: Secondary | ICD-10-CM | POA: Diagnosis not present

## 2019-03-09 ENCOUNTER — Ambulatory Visit: Payer: PPO

## 2019-03-23 DIAGNOSIS — E538 Deficiency of other specified B group vitamins: Secondary | ICD-10-CM | POA: Diagnosis not present

## 2019-03-23 DIAGNOSIS — E7800 Pure hypercholesterolemia, unspecified: Secondary | ICD-10-CM | POA: Diagnosis not present

## 2019-03-23 DIAGNOSIS — N390 Urinary tract infection, site not specified: Secondary | ICD-10-CM | POA: Diagnosis not present

## 2019-04-23 ENCOUNTER — Ambulatory Visit
Admission: RE | Admit: 2019-04-23 | Discharge: 2019-04-23 | Disposition: A | Discharge status: Home / Self Care | Payer: Medicare HMO | Source: Ambulatory Visit | Attending: Family Medicine | Admitting: Family Medicine

## 2019-04-23 ENCOUNTER — Other Ambulatory Visit: Payer: Self-pay

## 2019-04-23 DIAGNOSIS — Z1231 Encounter for screening mammogram for malignant neoplasm of breast: Secondary | ICD-10-CM | POA: Diagnosis not present

## 2019-04-24 ENCOUNTER — Other Ambulatory Visit: Payer: Self-pay | Admitting: Family Medicine

## 2019-04-24 DIAGNOSIS — R928 Other abnormal and inconclusive findings on diagnostic imaging of breast: Secondary | ICD-10-CM

## 2019-04-27 ENCOUNTER — Ambulatory Visit
Admission: RE | Admit: 2019-04-27 | Discharge: 2019-04-27 | Disposition: A | Discharge status: Home / Self Care | Payer: Medicare HMO | Source: Ambulatory Visit | Attending: Family Medicine | Admitting: Family Medicine

## 2019-04-27 ENCOUNTER — Other Ambulatory Visit: Payer: Self-pay

## 2019-04-27 ENCOUNTER — Ambulatory Visit
Admission: RE | Admit: 2019-04-27 | Discharge: 2019-04-27 | Disposition: A | Discharge status: Home / Self Care | Payer: PPO | Source: Ambulatory Visit | Attending: Family Medicine | Admitting: Family Medicine

## 2019-04-27 DIAGNOSIS — R928 Other abnormal and inconclusive findings on diagnostic imaging of breast: Secondary | ICD-10-CM

## 2019-04-27 DIAGNOSIS — N6489 Other specified disorders of breast: Secondary | ICD-10-CM | POA: Diagnosis not present

## 2019-06-14 DIAGNOSIS — R350 Frequency of micturition: Secondary | ICD-10-CM | POA: Diagnosis not present

## 2019-06-14 DIAGNOSIS — N390 Urinary tract infection, site not specified: Secondary | ICD-10-CM | POA: Diagnosis not present

## 2019-06-28 ENCOUNTER — Other Ambulatory Visit: Payer: Self-pay

## 2019-06-28 ENCOUNTER — Ambulatory Visit
Admission: EM | Admit: 2019-06-28 | Discharge: 2019-06-28 | Disposition: A | Discharge status: Home / Self Care | Payer: Medicare HMO | Attending: Emergency Medicine | Admitting: Emergency Medicine

## 2019-06-28 DIAGNOSIS — Z20828 Contact with and (suspected) exposure to other viral communicable diseases: Secondary | ICD-10-CM

## 2019-06-28 DIAGNOSIS — Z20822 Contact with and (suspected) exposure to covid-19: Secondary | ICD-10-CM

## 2019-06-28 DIAGNOSIS — R05 Cough: Secondary | ICD-10-CM | POA: Diagnosis not present

## 2019-06-28 MED ORDER — BENZONATATE 100 MG PO CAPS: 100.0000 mg | ORAL_CAPSULE | Freq: Three times a day (TID) | ORAL | 0 refills | Status: DC

## 2019-06-28 NOTE — Discharge Instructions (Signed)

## 2019-06-28 NOTE — ED Triage Notes (Signed)
Pt had positive exposure on the 24th , pat has had cough with fatigue with pain in back from coughing

## 2019-06-28 NOTE — ED Provider Notes (Signed)
Diana Smith   [id number] 06/28/19 Arrival Time: 1007   CC: COVID symptoms  SUBJECTIVE: History from: patient.  Diana Smith is a 73 y.o. female who presents with body aches, dry cough, and fatigue x 3 days.  Admits to positive COVID exposure on 06/21/2019.  Denies recent travel.  Has tried OTC cough drops with minimal relief.   Denies fever, sinus pain, rhinorrhea, sore throat, SOB, wheezing, chest pain, chest pressure, nausea, changes in bowel or bladder habits.    ROS: As per HPI.  All other pertinent ROS negative.     History reviewed. No pertinent past medical history. Past Surgical History:  Procedure Laterality Date  . MASS EXCISION  10/08/2011   Procedure: MINOR EXCISION OF MASS;  Surgeon: Cammie Sickle., MD;  Location: [facility];  Service: Orthopedics;  Laterality: Left;  excisional biopsy dorsum left long finger MCP joint   Allergies  Allergen Reactions  . Dynacin [Minocycline Hcl]   . Lorabid [Loracarbef]    No current facility-administered medications on file prior to encounter.   Current Outpatient Medications on File Prior to Encounter  Medication Sig Dispense Refill  . Biotin 1000 MCG CHEW Chew by mouth.    . busPIRone (BUSPAR) 5 MG tablet Take 5 mg by mouth 3 (three) times daily.    . calcium carbonate (OSCAL) 1500 (600 Ca) MG TABS tablet Take by mouth 2 (two) times daily with a meal.    . folic acid (FOLVITE) 414 MCG tablet Take 400 mcg by mouth daily.    . Multiple Vitamins-Minerals (PRESERVISION AREDS PO) Take by mouth.    Marland Kitchen omeprazole (PRILOSEC) 20 MG capsule Take 20 mg by mouth daily.    Marland Kitchen PARoxetine (PAXIL) 30 MG tablet Take 30 mg by mouth every morning.    . vitamin B-12 (CYANOCOBALAMIN) 500 MCG tablet Take 500 mcg by mouth daily.     Social History   Socioeconomic History  . Marital status: Married    Spouse name: Not on file  . Number of children: Not on file  . Years of education: Not on file  . Highest  education level: Not on file  Occupational History  . Not on file  Tobacco Use  . Smoking status: Never Smoker  . Smokeless tobacco: Never Used  Substance and Sexual Activity  . Alcohol use: Not on file  . Drug use: Not on file  . Sexual activity: Not on file  Other Topics Concern  . Not on file  Social History Narrative  . Not on file   Social Determinants of Health   Financial Resource Strain:   . Difficulty of Paying Living Expenses: Not on file  Food Insecurity:   . Worried About Charity fundraiser in the Last Year: Not on file  . Ran Out of Food in the Last Year: Not on file  Transportation Needs:   . Lack of Transportation (Medical): Not on file  . Lack of Transportation (Non-Medical): Not on file  Physical Activity:   . Days of Exercise per Week: Not on file  . Minutes of Exercise per Session: Not on file  Stress:   . Feeling of Stress : Not on file  Social Connections:   . Frequency of Communication with Friends and Family: Not on file  . Frequency of Social Gatherings with Friends and Family: Not on file  . Attends Religious Services: Not on file  . Active Member of Clubs or Organizations: Not on file  .  Attends Archivist Meetings: Not on file  . Marital Status: Not on file  Intimate Partner Violence:   . Fear of Current or Ex-Partner: Not on file  . Emotionally Abused: Not on file  . Physically Abused: Not on file  . Sexually Abused: Not on file   History reviewed. No pertinent family history.  OBJECTIVE:  Vitals:   06/28/19 1032  BP: 122/76  Pulse: 65  Resp: 18  Temp: 98.5 F (36.9 C)  SpO2: 96%     General appearance: alert; appears mildly fatigued, but nontoxic; speaking in full sentences and tolerating own secretions HEENT: NCAT; Ears: EACs clear, TMs pearly gray; Eyes: PERRL.  EOM grossly intact. Nose: nares patent without rhinorrhea, Throat: oropharynx clear, tonsils non erythematous or enlarged, uvula midline  Neck: supple  without LAD Lungs: unlabored respirations, symmetrical air entry; cough: mild; no respiratory distress; CTAB Heart: regular rate and rhythm.   Skin: warm and dry Psychological: alert and cooperative; normal mood and affect  ASSESSMENT & PLAN:  1. Suspected COVID-19 virus infection   2. Exposure to COVID-19 virus     Meds ordered this encounter  Medications  . benzonatate (TESSALON) 100 MG capsule    Sig: Take 1 capsule (100 mg total) by mouth every 8 (eight) hours.    Dispense:  21 capsule    Refill:  0    Order Specific Question:   Supervising Provider    Answer:   Raylene Everts [[id number]]   COVID testing ordered.  It will take between 5-7 days for test results.  Someone will contact you regarding abnormal results.    In the meantime: You should remain isolated in your home for 10 days from symptom onset AND greater than 72 hours after symptoms resolution (absence of fever without the use of fever-reducing medication and improvement in respiratory symptoms), whichever is longer Get plenty of rest and push fluids Tessalon Perles prescribed for cough Use OTC zyrtec for nasal congestion, runny nose, and/or sore throat Use OTC flonase for nasal congestion and runny nose Use medications daily for symptom relief Use OTC medications like ibuprofen or tylenol as needed fever or pain Call or go to the ED if you have any new or worsening symptoms such as fever, worsening cough, shortness of breath, chest tightness, chest pain, turning blue, changes in mental status, etc...   Reviewed expectations re: course of current medical issues. Questions answered. Outlined signs and symptoms indicating need for more acute intervention. Patient verbalized understanding. After Visit Summary given.         Lestine Box, PA-C 06/28/19 1112

## 2019-06-29 LAB — NOVEL CORONAVIRUS, NAA: SARS-CoV-2, NAA: DETECTED — AB

## 2019-07-02 ENCOUNTER — Telehealth (HOSPITAL_COMMUNITY): Payer: Self-pay | Admitting: Emergency Medicine

## 2019-07-02 NOTE — Telephone Encounter (Signed)
Your test for COVID-19 was positive, meaning that you were infected with the novel coronavirus and could give the germ to others.  Please continue isolation at home for at least 10 days since the start of your symptoms. If you do not have symptoms, please isolate at home for 10 days from the day you were tested. Once you complete your 10 day quarantine, you may return to normal activities as long as you've not had a fever for over 24 hours(without taking fever reducing medicine) and your symptoms are improving. Please continue good preventive care measures, including:  frequent hand-washing, avoid touching your face, cover coughs/sneezes, stay out of crowds and keep a 6 foot distance from others.  Go to the nearest hospital emergency room if fever/cough/breathlessness are severe or illness seems like a threat to life.  Patient contacted by phone and made aware of    results. Pt verbalized understanding and had all questions answered.  Quarantine ends Jan 8th.

## 2019-07-04 DIAGNOSIS — F411 Generalized anxiety disorder: Secondary | ICD-10-CM | POA: Diagnosis not present

## 2019-07-04 DIAGNOSIS — U071 COVID-19: Secondary | ICD-10-CM | POA: Diagnosis not present

## 2019-07-04 DIAGNOSIS — F331 Major depressive disorder, recurrent, moderate: Secondary | ICD-10-CM | POA: Diagnosis not present

## 2019-10-23 DIAGNOSIS — L820 Inflamed seborrheic keratosis: Secondary | ICD-10-CM | POA: Diagnosis not present

## 2019-10-23 DIAGNOSIS — D229 Melanocytic nevi, unspecified: Secondary | ICD-10-CM | POA: Diagnosis not present

## 2019-10-23 DIAGNOSIS — L814 Other melanin hyperpigmentation: Secondary | ICD-10-CM | POA: Diagnosis not present

## 2019-10-23 DIAGNOSIS — L218 Other seborrheic dermatitis: Secondary | ICD-10-CM | POA: Diagnosis not present

## 2019-10-23 DIAGNOSIS — L821 Other seborrheic keratosis: Secondary | ICD-10-CM | POA: Diagnosis not present

## 2019-10-23 DIAGNOSIS — L905 Scar conditions and fibrosis of skin: Secondary | ICD-10-CM | POA: Diagnosis not present

## 2019-10-23 DIAGNOSIS — Z85828 Personal history of other malignant neoplasm of skin: Secondary | ICD-10-CM | POA: Diagnosis not present

## 2019-12-24 DIAGNOSIS — R69 Illness, unspecified: Secondary | ICD-10-CM | POA: Diagnosis not present

## 2020-01-16 DIAGNOSIS — I4510 Unspecified right bundle-branch block: Secondary | ICD-10-CM | POA: Diagnosis not present

## 2020-01-16 DIAGNOSIS — E785 Hyperlipidemia, unspecified: Secondary | ICD-10-CM | POA: Diagnosis not present

## 2020-01-16 DIAGNOSIS — K7581 Nonalcoholic steatohepatitis (NASH): Secondary | ICD-10-CM | POA: Diagnosis not present

## 2020-01-16 DIAGNOSIS — N3281 Overactive bladder: Secondary | ICD-10-CM | POA: Diagnosis not present

## 2020-01-16 DIAGNOSIS — Z79899 Other long term (current) drug therapy: Secondary | ICD-10-CM | POA: Diagnosis not present

## 2020-01-16 DIAGNOSIS — E538 Deficiency of other specified B group vitamins: Secondary | ICD-10-CM | POA: Diagnosis not present

## 2020-01-16 DIAGNOSIS — Z0000 Encounter for general adult medical examination without abnormal findings: Secondary | ICD-10-CM | POA: Diagnosis not present

## 2020-01-16 DIAGNOSIS — R7303 Prediabetes: Secondary | ICD-10-CM | POA: Diagnosis not present

## 2020-01-16 DIAGNOSIS — K219 Gastro-esophageal reflux disease without esophagitis: Secondary | ICD-10-CM | POA: Diagnosis not present

## 2020-01-16 DIAGNOSIS — Z86010 Personal history of colonic polyps: Secondary | ICD-10-CM | POA: Diagnosis not present

## 2020-01-16 DIAGNOSIS — R739 Hyperglycemia, unspecified: Secondary | ICD-10-CM | POA: Diagnosis not present

## 2020-01-16 DIAGNOSIS — E559 Vitamin D deficiency, unspecified: Secondary | ICD-10-CM | POA: Diagnosis not present

## 2020-04-24 DIAGNOSIS — Z86010 Personal history of colonic polyps: Secondary | ICD-10-CM | POA: Diagnosis not present

## 2020-04-24 DIAGNOSIS — K921 Melena: Secondary | ICD-10-CM | POA: Diagnosis not present

## 2020-04-24 DIAGNOSIS — K5901 Slow transit constipation: Secondary | ICD-10-CM | POA: Diagnosis not present

## 2020-04-24 DIAGNOSIS — K649 Unspecified hemorrhoids: Secondary | ICD-10-CM | POA: Diagnosis not present

## 2020-06-09 DIAGNOSIS — H5213 Myopia, bilateral: Secondary | ICD-10-CM | POA: Diagnosis not present

## 2020-06-12 ENCOUNTER — Other Ambulatory Visit: Payer: Self-pay

## 2020-06-12 ENCOUNTER — Ambulatory Visit
Admission: EM | Admit: 2020-06-12 | Discharge: 2020-06-12 | Disposition: A | Discharge status: Home / Self Care | Payer: Medicare HMO | Attending: Internal Medicine | Admitting: Internal Medicine

## 2020-06-12 DIAGNOSIS — R3915 Urgency of urination: Secondary | ICD-10-CM

## 2020-06-12 LAB — POCT URINALYSIS DIP (MANUAL ENTRY)
Bilirubin, UA: NEGATIVE
Glucose, UA: NEGATIVE mg/dL
Ketones, POC UA: NEGATIVE mg/dL
Nitrite, UA: NEGATIVE
Protein Ur, POC: NEGATIVE mg/dL
Spec Grav, UA: 1.015 (ref 1.010–1.025)
Urobilinogen, UA: 0.2 E.U./dL
pH, UA: 5.5 (ref 5.0–8.0)

## 2020-06-12 MED ORDER — PHENAZOPYRIDINE HCL 200 MG PO TABS: 200.0000 mg | ORAL_TABLET | Freq: Three times a day (TID) | ORAL | 0 refills | Status: DC

## 2020-06-12 NOTE — Discharge Instructions (Signed)
Increase oral fluid intake We will call you with recommendations if the urine culture is abnormal If you develop fever and/or chills -please return to the urgent care for further evaluation.

## 2020-06-12 NOTE — ED Provider Notes (Signed)
[facility]    CSN: [id number] Arrival date & time: 06/12/20  1046      History   Chief Complaint Chief Complaint  Patient presents with  . Back Pain    HPI Diana Smith is a 74 y.o. female comes to urgent care with complaints of lower back discomfort.  This has been ongoing for the past several days.  She has been doing a lot of activities preparing for Christmas.  Yesterday pain got somewhat worse.  No fever or chills.  No trauma to the back.  Patient has urgency of micturition but denies any frequency.  She is not clear about dysuria.  No nausea or vomiting.   HPI  No past medical history on file.  There are no problems to display for this patient.   Past Surgical History:  Procedure Laterality Date  . MASS EXCISION  10/08/2011   Procedure: MINOR EXCISION OF MASS;  Surgeon: Cammie Sickle., MD;  Location: [facility];  Service: Orthopedics;  Laterality: Left;  excisional biopsy dorsum left long finger MCP joint    OB History   No obstetric history on file.      Home Medications    Prior to Admission medications   Medication Sig Start Date End Date Taking? Authorizing Provider  benzonatate (TESSALON) 100 MG capsule Take 1 capsule (100 mg total) by mouth every 8 (eight) hours. 06/28/19   Wurst, [place], PA-C  Biotin 1000 MCG CHEW Chew by mouth.    [provider]  busPIRone (BUSPAR) 5 MG tablet Take 5 mg by mouth 3 (three) times daily.    [provider]  calcium carbonate (OSCAL) 1500 (600 Ca) MG TABS tablet Take by mouth 2 (two) times daily with a meal.    [provider]  folic acid (FOLVITE) 147 MCG tablet Take 400 mcg by mouth daily.    [provider]  Multiple Vitamins-Minerals (PRESERVISION AREDS PO) Take by mouth.    [provider]  omeprazole (PRILOSEC) 20 MG capsule Take 20 mg by mouth daily.    [provider]  PARoxetine (PAXIL) 30 MG tablet Take 30 mg by mouth  every morning.    [provider]  phenazopyridine (PYRIDIUM) 200 MG tablet Take 1 tablet (200 mg total) by mouth 3 (three) times daily. 06/12/20   LampteyMyrene Galas, MD  vitamin B-12 (CYANOCOBALAMIN) 500 MCG tablet Take 500 mcg by mouth daily.    [provider]    Family History No family history on file.  Social History Social History   Tobacco Use  . Smoking status: Never Smoker  . Smokeless tobacco: Never Used     Allergies   Dynacin [minocycline hcl] and Lorabid [loracarbef]   Review of Systems Review of Systems  Gastrointestinal: Negative for nausea and vomiting.  Genitourinary: Positive for frequency and urgency. Negative for dysuria, flank pain and vaginal discharge.     Physical Exam Triage Vital Signs ED Triage Vitals  Enc Vitals Group     BP 06/12/20 1051 123/80     Pulse Rate 06/12/20 1051 83     Resp 06/12/20 1051 16     Temp 06/12/20 1051 97.8 F (36.6 C)     Temp Source 06/12/20 1051 Tympanic     SpO2 06/12/20 1051 90 %     Weight --      Height --      Head Circumference --      Peak Flow --  Pain Score 06/12/20 1056 3     Pain Loc --      Pain Edu? --      Excl. in [place]? --    No data found.  Updated Vital Signs BP 123/80 (BP Location: Right Arm)   Pulse 83   Temp 97.8 F (36.6 C) (Tympanic)   Resp 16   SpO2 90%   Visual Acuity Right Eye Distance:   Left Eye Distance:   Bilateral Distance:    Right Eye Near:   Left Eye Near:    Bilateral Near:     Physical Exam Vitals and nursing note reviewed.  Constitutional:      General: She is not in acute distress.    Appearance: She is not ill-appearing.  Cardiovascular:     Rate and Rhythm: Normal rate and regular rhythm.  Pulmonary:     Effort: Pulmonary effort is normal.     Breath sounds: Normal breath sounds.  Abdominal:     General: Bowel sounds are normal.     Palpations: Abdomen is soft.  Neurological:     Mental Status: She is alert.      UC  Treatments / Results  Labs (all labs ordered are listed, but only abnormal results are displayed) Labs Reviewed  POCT URINALYSIS DIP (MANUAL ENTRY) - Abnormal; Notable for the following components:      Result Value   Clarity, UA cloudy (*)    Blood, UA trace-intact (*)    Leukocytes, UA Moderate (2+) (*)    All other components within normal limits  URINE CULTURE    EKG   Radiology No results found.  Procedures Procedures (including critical care time)  Medications Ordered in UC Medications - No data to display  Initial Impression / Assessment and Plan / UC Course  I have reviewed the triage vital signs and the nursing notes.  Pertinent labs & imaging results that were available during my care of the patient were reviewed by me and considered in my medical decision making (see chart for details).     1: Urgency of micturition: Point-of-care urinalysis is significant for leukocyte esterase and hemoglobin. Urine culture sent Pyridium as needed for bladder pain We will call patient with culture results if abnormal. Return precautions given. Final Clinical Impressions(s) / UC Diagnoses   Final diagnoses:  Urgency of urination     Discharge Instructions     Increase oral fluid intake We will call you with recommendations if the urine culture is abnormal If you develop fever and/or chills -please return to the urgent care for further evaluation.   ED Prescriptions    Medication Sig Dispense Auth. Provider   phenazopyridine (PYRIDIUM) 200 MG tablet Take 1 tablet (200 mg total) by mouth 3 (three) times daily. 6 tablet Deantae Shackleton, Myrene Galas, MD     PDMP not reviewed this encounter.   Chase Picket, MD 06/12/20 1152

## 2020-06-12 NOTE — ED Triage Notes (Signed)
Pt presents with lower back discomfort that began yesterday, states  This is usually how she feels with uti

## 2020-06-14 ENCOUNTER — Telehealth: Payer: Self-pay | Admitting: Emergency Medicine

## 2020-06-14 LAB — URINE CULTURE: Culture: >=100000 — AB

## 2020-06-14 MED ORDER — NITROFURANTOIN MONOHYD MACRO 100 MG PO CAPS: 100.0000 mg | ORAL_CAPSULE | Freq: Two times a day (BID) | ORAL | 0 refills | Status: DC

## 2020-06-23 DIAGNOSIS — Z0100 Encounter for examination of eyes and vision without abnormal findings: Secondary | ICD-10-CM | POA: Diagnosis not present

## 2020-07-04 ENCOUNTER — Encounter: Payer: Self-pay | Admitting: Emergency Medicine

## 2020-07-04 ENCOUNTER — Ambulatory Visit
Admission: EM | Admit: 2020-07-04 | Discharge: 2020-07-04 | Disposition: A | Discharge status: Home / Self Care | Payer: Medicare HMO | Attending: Family Medicine | Admitting: Family Medicine

## 2020-07-04 ENCOUNTER — Other Ambulatory Visit: Payer: Self-pay

## 2020-07-04 DIAGNOSIS — R109 Unspecified abdominal pain: Secondary | ICD-10-CM | POA: Diagnosis not present

## 2020-07-04 DIAGNOSIS — R350 Frequency of micturition: Secondary | ICD-10-CM | POA: Insufficient documentation

## 2020-07-04 DIAGNOSIS — N760 Acute vaginitis: Secondary | ICD-10-CM | POA: Diagnosis not present

## 2020-07-04 HISTORY — DX: Depression, unspecified: F32.A

## 2020-07-04 HISTORY — DX: Anxiety disorder, unspecified: F41.9

## 2020-07-04 LAB — POCT URINALYSIS DIP (MANUAL ENTRY)
Bilirubin, UA: NEGATIVE
Blood, UA: NEGATIVE
Glucose, UA: NEGATIVE mg/dL
Ketones, POC UA: NEGATIVE mg/dL
Nitrite, UA: NEGATIVE
Protein Ur, POC: NEGATIVE mg/dL
Spec Grav, UA: 1.015 (ref 1.010–1.025)
Urobilinogen, UA: 0.2 E.U./dL
pH, UA: 7 (ref 5.0–8.0)

## 2020-07-04 MED ORDER — FLUCONAZOLE 150 MG PO TABS: ORAL_TABLET | ORAL | 0 refills | Status: DC

## 2020-07-04 NOTE — ED Provider Notes (Signed)
[facility]   CC: UTI  SUBJECTIVE:  Diana Smith is a 75 y.o. female who complains of urinary frequency, urgency and dysuria for the past 2 days. Patient denies a precipitating event, recent sexual encounter, excessive caffeine intake. Localizes the pain to the right flank.  Pain is intermittent and describes it as sharp. Has not tried OTC medications without relief. Treated with antibiotic for a UTI recently and now having vaginal discomfort. Symptoms are made worse with urination. Admits to similar symptoms in the past. Denies fever, chills, nausea, vomiting, abdominal pain, abnormal vaginal discharge or bleeding, hematuria.    LMP: No LMP recorded. Patient has had a hysterectomy.  ROS: As in HPI.  All other pertinent ROS negative.     Past Medical History:  Diagnosis Date  . Anxiety   . Depression    Past Surgical History:  Procedure Laterality Date  . MASS EXCISION  10/08/2011   Procedure: MINOR EXCISION OF MASS;  Surgeon: Cammie Sickle., MD;  Location: [place];  Service: Orthopedics;  Laterality: Left;  excisional biopsy dorsum left long finger MCP joint   Allergies  Allergen Reactions  . Dynacin [Minocycline Hcl]   . Lorabid [Loracarbef]    No current facility-administered medications on file prior to encounter.   Current Outpatient Medications on File Prior to Encounter  Medication Sig Dispense Refill  . benzonatate (TESSALON) 100 MG capsule Take 1 capsule (100 mg total) by mouth every 8 (eight) hours. 21 capsule 0  . Biotin 1000 MCG CHEW Chew by mouth.    . busPIRone (BUSPAR) 5 MG tablet Take 5 mg by mouth 3 (three) times daily.    . calcium carbonate (OSCAL) 1500 (600 Ca) MG TABS tablet Take by mouth 2 (two) times daily with a meal.    . folic acid (FOLVITE) 431 MCG tablet Take 400 mcg by mouth daily.    . Multiple Vitamins-Minerals (PRESERVISION AREDS PO) Take by mouth.    . nitrofurantoin, macrocrystal-monohydrate, (MACROBID) 100  MG capsule Take 1 capsule (100 mg total) by mouth 2 (two) times daily. 10 capsule 0  . omeprazole (PRILOSEC) 20 MG capsule Take 20 mg by mouth daily.    Marland Kitchen PARoxetine (PAXIL) 30 MG tablet Take 30 mg by mouth every morning.    . phenazopyridine (PYRIDIUM) 200 MG tablet Take 1 tablet (200 mg total) by mouth 3 (three) times daily. 6 tablet 0  . vitamin B-12 (CYANOCOBALAMIN) 500 MCG tablet Take 500 mcg by mouth daily.     Social History   Socioeconomic History  . Marital status: Married    Spouse name: Not on file  . Number of children: Not on file  . Years of education: Not on file  . Highest education level: Not on file  Occupational History  . Not on file  Tobacco Use  . Smoking status: Never Smoker  . Smokeless tobacco: Never Used  Substance and Sexual Activity  . Alcohol use: Not on file  . Drug use: Not on file  . Sexual activity: Not on file  Other Topics Concern  . Not on file  Social History Narrative  . Not on file   Social Determinants of Health   Financial Resource Strain: Not on file  Food Insecurity: Not on file  Transportation Needs: Not on file  Physical Activity: Not on file  Stress: Not on file  Social Connections: Not on file  Intimate Partner Violence: Not on file   No family history on  file.  OBJECTIVE:  Vitals:   07/04/20 1514  BP: 129/79  Pulse: 82  Resp: 16  Temp: 98.8 F (37.1 C)  TempSrc: Oral  SpO2: 96%   General appearance: AOx3 in no acute distress HEENT: NCAT. Oropharynx clear.  Lungs: clear to auscultation bilaterally without adventitious breath sounds Heart: regular rate and rhythm. Radial pulses 2+ symmetrical bilaterally Abdomen: soft; non-distended; no tenderness; bowel sounds present; no guarding or rebound tenderness Back: no CVA tenderness Extremities: no edema; symmetrical with no gross deformities Skin: warm and dry Neurologic: Ambulates from chair to exam table without difficulty Psychological: alert and cooperative;  normal mood and affect  Labs Reviewed  POCT URINALYSIS DIP (MANUAL ENTRY) - Abnormal; Notable for the following components:      Result Value   Leukocytes, UA Trace (*)    All other components within normal limits  URINE CULTURE    ASSESSMENT & PLAN:  1. Urinary frequency   2. Right flank pain   3. Vaginitis and vulvovaginitis     Meds ordered this encounter  Medications  . fluconazole (DIFLUCAN) 150 MG tablet    Sig: Take one tablet at the onset of symptoms. If symptoms are still present 3 days later, take the second tablet.    Dispense:  2 tablet    Refill:  0    Order Specific Question:   Supervising Provider    Answer:   Chase Picket [id number]   I have sent in fluconazole in case of yeast. Take one tablet at the onset of symptoms. If symptoms are still present in 3 days, take the second tablet.  Urine culture sent  We will call you with abnormal results that need further treatment Push fluids and get plenty of rest Follow up with PCP if symptoms persists Return here or go to ER if you have any new or worsening symptoms such as fever, worsening abdominal pain, nausea/vomiting, flank pain  Outlined signs and symptoms indicating need for more acute intervention Patient verbalized understanding After Visit Summary given     Faustino Congress, NP 07/04/20 1546

## 2020-07-04 NOTE — ED Triage Notes (Signed)
Pain to RT back area.  Denies any burning with urination or frequency.  Only passes a small amount of urine when attempting to urinate.

## 2020-07-04 NOTE — Discharge Instructions (Signed)
I have sent in fluconazole in case of yeast. Take one tablet at the onset of symptoms. If symptoms are still present in 3 days, take the second tablet.   You may have a urinary tract infection.   We are going to culture your urine and will call you as soon as we have the results.   Drink plenty of water, 8-10 glasses per day.   You may take AZO over the counter for painful urination.  Follow up with your primary care provider as needed.   Go to the Emergency Department if you experience severe pain, shortness of breath, high fever, or other concerns.

## 2020-07-06 LAB — URINE CULTURE

## 2020-07-09 ENCOUNTER — Other Ambulatory Visit: Payer: Self-pay

## 2020-07-09 ENCOUNTER — Encounter: Payer: Self-pay | Admitting: Emergency Medicine

## 2020-07-09 ENCOUNTER — Ambulatory Visit
Admission: EM | Admit: 2020-07-09 | Discharge: 2020-07-09 | Disposition: A | Discharge status: Home / Self Care | Payer: Medicare HMO | Attending: Family Medicine | Admitting: Family Medicine

## 2020-07-09 DIAGNOSIS — R350 Frequency of micturition: Secondary | ICD-10-CM | POA: Insufficient documentation

## 2020-07-09 DIAGNOSIS — R300 Dysuria: Secondary | ICD-10-CM | POA: Insufficient documentation

## 2020-07-09 DIAGNOSIS — N3001 Acute cystitis with hematuria: Secondary | ICD-10-CM | POA: Insufficient documentation

## 2020-07-09 DIAGNOSIS — R3915 Urgency of urination: Secondary | ICD-10-CM | POA: Diagnosis not present

## 2020-07-09 HISTORY — DX: Urinary tract infection, site not specified: N39.0

## 2020-07-09 LAB — POCT URINALYSIS DIP (MANUAL ENTRY)
Bilirubin, UA: NEGATIVE
Glucose, UA: NEGATIVE mg/dL
Ketones, POC UA: NEGATIVE mg/dL
Nitrite, UA: NEGATIVE
Protein Ur, POC: NEGATIVE mg/dL
Spec Grav, UA: <=1.005 — AB (ref 1.010–1.025)
Urobilinogen, UA: 0.2 E.U./dL
pH, UA: 6.5 (ref 5.0–8.0)

## 2020-07-09 MED ORDER — CEPHALEXIN 500 MG PO CAPS: 500.0000 mg | ORAL_CAPSULE | Freq: Two times a day (BID) | ORAL | 0 refills | Status: AC

## 2020-07-09 NOTE — Discharge Instructions (Addendum)
You may have a urinary tract infection.   I have sent in Keflex for you to take twice a day for 7 days  We are going to culture your urine and will call you as soon as we have the results.   Drink plenty of water, 8-10 glasses per day.   You may take AZO over the counter for painful urination.  Follow up with your primary care provider as needed.   Go to the Emergency Department if you experience severe pain, shortness of breath, high fever, or other concerns.

## 2020-07-09 NOTE — ED Triage Notes (Signed)
Pt presents today with continued dysuria. Reports pressure in bladder with urination. She has completed last course of antibiotic. Dana

## 2020-07-09 NOTE — ED Provider Notes (Signed)
[facility]   CC: UTI  SUBJECTIVE:  Diana Smith is a 75 y.o. female who complains of continued urinary frequency, urinary urgency and dysuria for about the last week. Was seen in this office last week and treated for possible yeast. Reports that her symptoms have persisted. Patient denies a precipitating event, recent sexual encounter, excessive caffeine intake. Localizes the pain to the lower abdomen. Pain is intermittent and describes it as sharp. Symptoms are made worse with urination. Admits to similar symptoms in the past.  Denies fever, chills, nausea, vomiting, flank pain, abnormal vaginal discharge or bleeding, hematuria.    LMP: No LMP recorded. Patient has had a hysterectomy.  ROS: As in HPI.  All other pertinent ROS negative.     Past Medical History:  Diagnosis Date  . Anxiety   . Depression   . Frequent urinary tract infections    Past Surgical History:  Procedure Laterality Date  . MASS EXCISION  10/08/2011   Procedure: MINOR EXCISION OF MASS;  Surgeon: Wyn Forster., MD;  Location: [facility];  Service: Orthopedics;  Laterality: Left;  excisional biopsy dorsum left long finger MCP joint   Allergies  Allergen Reactions  . Dynacin [Minocycline Hcl]   . Lorabid [Loracarbef]    No current facility-administered medications on file prior to encounter.   Current Outpatient Medications on File Prior to Encounter  Medication Sig Dispense Refill  . benzonatate (TESSALON) 100 MG capsule Take 1 capsule (100 mg total) by mouth every 8 (eight) hours. 21 capsule 0  . Biotin 1000 MCG CHEW Chew by mouth.    . busPIRone (BUSPAR) 5 MG tablet Take 5 mg by mouth 3 (three) times daily.    . calcium carbonate (OSCAL) 1500 (600 Ca) MG TABS tablet Take by mouth 2 (two) times daily with a meal.    . fluconazole (DIFLUCAN) 150 MG tablet Take one tablet at the onset of symptoms. If symptoms are still present 3 days later, take the second tablet. 2 tablet 0   . folic acid (FOLVITE) 400 MCG tablet Take 400 mcg by mouth daily.    . Multiple Vitamins-Minerals (PRESERVISION AREDS PO) Take by mouth.    Marland Kitchen omeprazole (PRILOSEC) 20 MG capsule Take 20 mg by mouth daily.    Marland Kitchen PARoxetine (PAXIL) 30 MG tablet Take 30 mg by mouth every morning.    . vitamin B-12 (CYANOCOBALAMIN) 500 MCG tablet Take 500 mcg by mouth daily.     Social History   Socioeconomic History  . Marital status: Married    Spouse name: Not on file  . Number of children: Not on file  . Years of education: Not on file  . Highest education level: Not on file  Occupational History  . Not on file  Tobacco Use  . Smoking status: Never Smoker  . Smokeless tobacco: Never Used  Substance and Sexual Activity  . Alcohol use: Not on file  . Drug use: Not on file  . Sexual activity: Not on file  Other Topics Concern  . Not on file  Social History Narrative  . Not on file   Social Determinants of Health   Financial Resource Strain: Not on file  Food Insecurity: Not on file  Transportation Needs: Not on file  Physical Activity: Not on file  Stress: Not on file  Social Connections: Not on file  Intimate Partner Violence: Not on file   History reviewed. No pertinent family history.  OBJECTIVE:  Vitals:  07/09/20 1444  BP: 129/86  Pulse: 79  Resp: 20  Temp: 97.9 F (36.6 C)  TempSrc: Oral  SpO2: 98%   General appearance: AOx3 in no acute distress HEENT: NCAT. Oropharynx clear.  Lungs: clear to auscultation bilaterally without adventitious breath sounds Heart: regular rate and rhythm. Radial pulses 2+ symmetrical bilaterally Abdomen: soft; non-distended; suprapubic tenderness; bowel sounds present; no guarding or rebound tenderness Back: no CVA tenderness Extremities: no edema; symmetrical with no gross deformities Skin: warm and dry Neurologic: Ambulates from chair to exam table without difficulty Psychological: alert and cooperative; normal mood and affect  Labs  Reviewed  URINE CULTURE - Abnormal; Notable for the following components:      Result Value   Culture   (*)    Value: 10,000 COLONIES/mL MULTIPLE SPECIES PRESENT, SUGGEST RECOLLECTION   All other components within normal limits  POCT URINALYSIS DIP (MANUAL ENTRY) - Abnormal; Notable for the following components:   Spec Grav, UA <=1.005 (*)    Blood, UA large (*)    Leukocytes, UA Large (3+) (*)    All other components within normal limits    ASSESSMENT & PLAN:  1. Acute cystitis with hematuria   2. Dysuria   3. Urinary frequency   4. Urinary urgency     Meds ordered this encounter  Medications  . cephALEXin (KEFLEX) 500 MG capsule    Sig: Take 1 capsule (500 mg total) by mouth 2 (two) times daily for 7 days.    Dispense:  14 capsule    Refill:  0    Order Specific Question:   Supervising Provider    Answer:   Merrilee Jansky [[id number]]   UA concerning for infection today Prescribed Keflex BID x 7 days Urine culture sent  We will call you with abnormal results that need further treatment Push fluids and get plenty of rest Take antibiotic as directed and to completion Take pyridium as prescribed and as needed for symptomatic relief Follow up with PCP if symptoms persists Return here or go to ER if you have any new or worsening symptoms such as fever, worsening abdominal pain, nausea/vomiting, flank pain  Outlined signs and symptoms indicating need for more acute intervention Patient verbalized understanding After Visit Summary given     Moshe Cipro, NP 07/11/20 1320

## 2020-07-11 LAB — URINE CULTURE: Culture: 10000 — AB

## 2020-07-15 DIAGNOSIS — M25561 Pain in right knee: Secondary | ICD-10-CM | POA: Diagnosis not present

## 2020-10-30 DIAGNOSIS — R7303 Prediabetes: Secondary | ICD-10-CM | POA: Diagnosis not present

## 2020-10-30 DIAGNOSIS — R413 Other amnesia: Secondary | ICD-10-CM | POA: Diagnosis not present

## 2020-10-30 DIAGNOSIS — E538 Deficiency of other specified B group vitamins: Secondary | ICD-10-CM | POA: Diagnosis not present

## 2020-10-30 DIAGNOSIS — Z79899 Other long term (current) drug therapy: Secondary | ICD-10-CM | POA: Diagnosis not present

## 2020-11-11 ENCOUNTER — Encounter: Payer: Self-pay | Admitting: Neurology

## 2020-11-13 ENCOUNTER — Other Ambulatory Visit: Payer: Self-pay

## 2020-11-13 ENCOUNTER — Encounter: Payer: Self-pay | Admitting: Emergency Medicine

## 2020-11-13 ENCOUNTER — Ambulatory Visit
Admission: EM | Admit: 2020-11-13 | Discharge: 2020-11-13 | Disposition: A | Discharge status: Home / Self Care | Payer: Medicare HMO | Attending: Emergency Medicine | Admitting: Emergency Medicine

## 2020-11-13 DIAGNOSIS — R109 Unspecified abdominal pain: Secondary | ICD-10-CM | POA: Insufficient documentation

## 2020-11-13 LAB — POCT URINALYSIS DIP (MANUAL ENTRY)
Bilirubin, UA: NEGATIVE
Glucose, UA: NEGATIVE mg/dL
Ketones, POC UA: NEGATIVE mg/dL
Nitrite, UA: NEGATIVE
Protein Ur, POC: NEGATIVE mg/dL
Spec Grav, UA: 1.02 (ref 1.010–1.025)
Urobilinogen, UA: 0.2 E.U./dL
pH, UA: 6 (ref 5.0–8.0)

## 2020-11-13 MED ORDER — SULFAMETHOXAZOLE-TRIMETHOPRIM 800-160 MG PO TABS: 1.0000 | ORAL_TABLET | Freq: Two times a day (BID) | ORAL | 0 refills | Status: AC

## 2020-11-13 NOTE — ED Triage Notes (Signed)
Right flank pain x 2 days.  Hurts with movement.

## 2020-11-13 NOTE — Discharge Instructions (Signed)
Urine concerning for UTI Urine culture sent.  We will call you with the results.   Push fluids and get plenty of rest.   Take antibiotic as directed and to completion Follow up with PCP if symptoms persists Return here or go to ER if you have any new or worsening symptoms such as fever, worsening abdominal pain, nausea/vomiting, flank pain, etc..Marland Kitchen

## 2020-11-13 NOTE — ED Provider Notes (Signed)
[facility]   CC: RT flank pain  SUBJECTIVE:  Diana Smith is a 75 y.o. female who complains of RT flank pain x few days that is intermittent and dull.  Patient denies a precipitating event, recent sexual encounter, excessive caffeine intake.  Has tried OTC medications without relief.  Symptoms are made worse with movement.  Admits to similar symptoms in the past.  Denies fever, chills, nausea, vomiting, abdominal pain, dysuria, urinary frequency/ urgency, hematuria.    LMP: No LMP recorded. Patient has had a hysterectomy.  ROS: As in HPI.  All other pertinent ROS negative.     Past Medical History:  Diagnosis Date  . Anxiety   . Depression   . Frequent urinary tract infections    Past Surgical History:  Procedure Laterality Date  . MASS EXCISION  10/08/2011   Procedure: MINOR EXCISION OF MASS;  Surgeon: Cammie Sickle., MD;  Location: [facility];  Service: Orthopedics;  Laterality: Left;  excisional biopsy dorsum left long finger MCP joint   Allergies  Allergen Reactions  . Dynacin [Minocycline Hcl]   . Lorabid [Loracarbef]    No current facility-administered medications on file prior to encounter.   Current Outpatient Medications on File Prior to Encounter  Medication Sig Dispense Refill  . Biotin 1000 MCG CHEW Chew by mouth.    . busPIRone (BUSPAR) 5 MG tablet Take 5 mg by mouth 3 (three) times daily.    . calcium carbonate (OSCAL) 1500 (600 Ca) MG TABS tablet Take by mouth 2 (two) times daily with a meal.    . folic acid (FOLVITE) 017 MCG tablet Take 400 mcg by mouth daily.    . Multiple Vitamins-Minerals (PRESERVISION AREDS PO) Take by mouth.    Marland Kitchen omeprazole (PRILOSEC) 20 MG capsule Take 20 mg by mouth daily.    Marland Kitchen PARoxetine (PAXIL) 30 MG tablet Take 30 mg by mouth every morning.    . vitamin B-12 (CYANOCOBALAMIN) 500 MCG tablet Take 500 mcg by mouth daily.     Social History   Socioeconomic History  . Marital status: Married     Spouse name: Not on file  . Number of children: Not on file  . Years of education: Not on file  . Highest education level: Not on file  Occupational History  . Not on file  Tobacco Use  . Smoking status: Never Smoker  . Smokeless tobacco: Never Used  Substance and Sexual Activity  . Alcohol use: Not on file  . Drug use: Never  . Sexual activity: Not on file  Other Topics Concern  . Not on file  Social History Narrative  . Not on file   Social Determinants of Health   Financial Resource Strain: Not on file  Food Insecurity: Not on file  Transportation Needs: Not on file  Physical Activity: Not on file  Stress: Not on file  Social Connections: Not on file  Intimate Partner Violence: Not on file   History reviewed. No pertinent family history.  OBJECTIVE:  Vitals:   11/13/20 1422  BP: (!) 109/58  Pulse: 73  Resp: 18  Temp: 98.1 F (36.7 C)  TempSrc: Oral  SpO2: 95%   General appearance: AOx3 in no acute distress HEENT: NCAT.  Oropharynx clear.  Lungs: clear to auscultation bilaterally without adventitious breath sounds Heart: regular rate and rhythm.   Abdomen: soft; non-distended; no tenderness; bowel sounds present; no guarding Back: no CVA tenderness Extremities: no edema; symmetrical with no gross  deformities Skin: warm and dry Neurologic: Ambulates from chair to exam table without difficulty Psychological: alert and cooperative; normal mood and affect  Labs Reviewed  POCT URINALYSIS DIP (MANUAL ENTRY) - Abnormal; Notable for the following components:      Result Value   Blood, UA trace-intact (*)    Leukocytes, UA Small (1+) (*)    All other components within normal limits  URINE CULTURE    ASSESSMENT & PLAN:  1. Flank pain     Meds ordered this encounter  Medications  . sulfamethoxazole-trimethoprim (BACTRIM DS) 800-160 MG tablet    Sig: Take 1 tablet by mouth 2 (two) times daily for 7 days.    Dispense:  14 tablet    Refill:  0    Order  Specific Question:   Supervising Provider    Answer:   Raylene Everts [[id number]]   Urine concerning for UTI Urine culture sent.  We will call you with the results.   Push fluids and get plenty of rest.   Take antibiotic as directed and to completion Follow up with PCP if symptoms persists Return here or go to ER if you have any new or worsening symptoms such as fever, worsening abdominal pain, nausea/vomiting, flank pain, etc...  Outlined signs and symptoms indicating need for more acute intervention. Patient verbalized understanding. After Visit Summary given.     Lestine Box, PA-C 11/13/20 1549

## 2020-11-15 LAB — URINE CULTURE

## 2020-12-20 ENCOUNTER — Other Ambulatory Visit: Payer: Self-pay

## 2020-12-20 ENCOUNTER — Ambulatory Visit
Admission: EM | Admit: 2020-12-20 | Discharge: 2020-12-20 | Disposition: A | Discharge status: Home / Self Care | Payer: Medicare HMO | Attending: Family Medicine | Admitting: Family Medicine

## 2020-12-20 ENCOUNTER — Ambulatory Visit (INDEPENDENT_AMBULATORY_CARE_PROVIDER_SITE_OTHER): Payer: Medicare HMO

## 2020-12-20 DIAGNOSIS — M25511 Pain in right shoulder: Secondary | ICD-10-CM

## 2020-12-20 DIAGNOSIS — M546 Pain in thoracic spine: Secondary | ICD-10-CM | POA: Diagnosis present

## 2020-12-20 DIAGNOSIS — Z87440 Personal history of urinary (tract) infections: Secondary | ICD-10-CM | POA: Insufficient documentation

## 2020-12-20 LAB — POCT URINALYSIS DIP (MANUAL ENTRY)
Bilirubin, UA: NEGATIVE
Glucose, UA: NEGATIVE mg/dL
Ketones, POC UA: NEGATIVE mg/dL
Nitrite, UA: NEGATIVE
Protein Ur, POC: NEGATIVE mg/dL
Spec Grav, UA: 1.015 (ref 1.010–1.025)
Urobilinogen, UA: 0.2 E.U./dL
pH, UA: 6.5 (ref 5.0–8.0)

## 2020-12-20 MED ORDER — TIZANIDINE HCL 4 MG PO TABS: 4.0000 mg | ORAL_TABLET | Freq: Four times a day (QID) | ORAL | 0 refills | Status: DC | PRN

## 2020-12-20 NOTE — Discharge Instructions (Addendum)
Your xray is negative for fracture, pneumonia, and misalignment  I am thinking that this is muscular.  I have sent in tizanidine for you to take one tablet every 6 hours as needed for muscle spasms  May take tylenol/ibuprofen as needed for pain  Follow up with this office or with primary care if symptoms are persisting.  Follow up in the ER for high fever, trouble swallowing, trouble breathing, other concerning symptoms.

## 2020-12-20 NOTE — ED Triage Notes (Signed)
Pt presents with c/o back pain and is concerned for kidney infection

## 2020-12-20 NOTE — ED Triage Notes (Signed)
Pt states that she has had a cough and upper back pain

## 2020-12-20 NOTE — ED Provider Notes (Signed)
[facility]   [id number] 12/20/20 Arrival Time: 1042  [id number] PAIN  SUBJECTIVE: History from: patient. Diana Smith is a 75 y.o. female complains of thoracic pain that began about a week ago. Denies a precipitating event or specific injury.  Localizes the pain to the right thoracic back. Also has hx multiple kidney infections. Describes the pain as constant and achy in character. Has tried OTC medications without relief. Symptoms are made worse with activity.  Denies similar symptoms in the past. Denies fever, chills, erythema, ecchymosis, effusion, weakness, numbness and tingling, saddle paresthesias, loss of bowel or bladder function.      ROS: As per HPI.  All other pertinent ROS negative.     Past Medical History:  Diagnosis Date   Anxiety    Depression    Frequent urinary tract infections    Past Surgical History:  Procedure Laterality Date   MASS EXCISION  10/08/2011   Procedure: MINOR EXCISION OF MASS;  Surgeon: Cammie Sickle., MD;  Location: [place] [facility];  Service: Orthopedics;  Laterality: Left;  excisional biopsy dorsum left long finger MCP joint   Allergies  Allergen Reactions   Dynacin [Minocycline Hcl]    Lorabid [Loracarbef]    No current facility-administered medications on file prior to encounter.   Current Outpatient Medications on File Prior to Encounter  Medication Sig Dispense Refill   Biotin 1000 MCG CHEW Chew by mouth.     busPIRone (BUSPAR) 5 MG tablet Take 5 mg by mouth 3 (three) times daily.     calcium carbonate (OSCAL) 1500 (600 Ca) MG TABS tablet Take by mouth 2 (two) times daily with a meal.     folic acid (FOLVITE) 417 MCG tablet Take 400 mcg by mouth daily.     Multiple Vitamins-Minerals (PRESERVISION AREDS PO) Take by mouth.     omeprazole (PRILOSEC) 20 MG capsule Take 20 mg by mouth daily.     PARoxetine (PAXIL) 30 MG tablet Take 30 mg by mouth every morning.     vitamin B-12 (CYANOCOBALAMIN) 500 MCG tablet Take  500 mcg by mouth daily.     Social History   Socioeconomic History   Marital status: Married    Spouse name: Not on file   Number of children: Not on file   Years of education: Not on file   Highest education level: Not on file  Occupational History   Not on file  Tobacco Use   Smoking status: Never   Smokeless tobacco: Never  Substance and Sexual Activity   Alcohol use: Not on file   Drug use: Never   Sexual activity: Not on file  Other Topics Concern   Not on file  Social History Narrative   Not on file   Social Determinants of Health   Financial Resource Strain: Not on file  Food Insecurity: Not on file  Transportation Needs: Not on file  Physical Activity: Not on file  Stress: Not on file  Social Connections: Not on file  Intimate Partner Violence: Not on file   History reviewed. No pertinent family history.  OBJECTIVE:  Vitals:   12/20/20 1152  BP: 133/72  Pulse: 63  Resp: 20  Temp: 98.1 F (36.7 C)  TempSrc: Oral  SpO2: 93%    General appearance: ALERT; in no acute distress.  Head: NCAT Lungs: Normal respiratory effort CV: pulses 2+ bilaterally. Cap refill < 2 seconds Musculoskeletal:  Inspection: Skin warm, dry, clear and intact No erythema, effusion noted Palpation:  R thoracic back non tender to palpation ROM: FROM active and passive Skin: warm and dry Neurologic: Ambulates without difficulty; Sensation intact about the upper/ lower extremities Psychological: alert and cooperative; normal mood and affect  DIAGNOSTIC STUDIES:  No results found.   ASSESSMENT & PLAN:  1. Acute right-sided thoracic back pain      Meds ordered this encounter  Medications   tiZANidine (ZANAFLEX) 4 MG tablet    Sig: Take 1 tablet (4 mg total) by mouth every 6 (six) hours as needed for muscle spasms.    Dispense:  30 tablet    Refill:  0    Order Specific Question:   Supervising Provider    Answer:   Chase Picket [id number]   UA with trace blood and  leuks Will culture and inform of positive results that require treatment Xray today negative Continue conservative management of rest, ice, and gentle stretches Take ibuprofen/tylenol as needed for pain relief (may cause abdominal discomfort, ulcers, and GI bleeds avoid taking with other NSAIDs) Take tizanidine at nighttime for symptomatic relief. Avoid driving or operating heavy machinery while using medication. Follow up with PCP if symptoms persist Return or go to the ER if you have any new or worsening symptoms (fever, chills, chest pain, abdominal pain, changes in bowel or bladder habits, pain radiating into lower legs)  Reviewed expectations re: course of current medical issues. Questions answered. Outlined signs and symptoms indicating need for more acute intervention. Patient verbalized understanding. After Visit Summary given.        Faustino Congress, NP 12/20/20 1309

## 2020-12-22 LAB — URINE CULTURE

## 2021-01-08 ENCOUNTER — Other Ambulatory Visit: Payer: Self-pay

## 2021-01-08 ENCOUNTER — Encounter: Payer: Self-pay | Admitting: Neurology

## 2021-01-08 ENCOUNTER — Ambulatory Visit: Payer: Medicare HMO | Admitting: Neurology

## 2021-01-08 VITALS — BP 135/77 | HR 65 | Ht 65.0 in | Wt 167.2 lb

## 2021-01-08 DIAGNOSIS — F419 Anxiety disorder, unspecified: Secondary | ICD-10-CM

## 2021-01-08 DIAGNOSIS — R413 Other amnesia: Secondary | ICD-10-CM

## 2021-01-08 DIAGNOSIS — F32A Depression, unspecified: Secondary | ICD-10-CM | POA: Diagnosis not present

## 2021-01-08 DIAGNOSIS — R69 Illness, unspecified: Secondary | ICD-10-CM | POA: Diagnosis not present

## 2021-01-08 NOTE — Progress Notes (Signed)
NEUROLOGY CONSULTATION NOTE  Diana Smith MRN: [id number] DOB: [birth year]  Referring provider: Dr. Jonathon Jordan Primary care provider: Dr. Jonathon Jordan  Reason for consult:  memory loss  Dear Dr Stephanie Acre:  Thank you for your kind referral of Diana Smith for consultation of the above symptoms. Although her history is well known to you, please allow me to reiterate it for the purpose of our medical record. The patient was accompanied to the clinic by her husband who also provides collateral information. Records and images were personally reviewed where available.   HISTORY OF PRESENT ILLNESS: This is a 75 year old left-handed woman with a history of anxiety,depression, presenting for evaluation of memory loss. She is not sure of her age and states she is 24. She gets emotional speaking about her memory. Her husband started noticing changes around 2 years ago, "she was just so upset I thought she was bipolar." She would get very upset then calm down. She was started on Paxil which helped, if she does not take it she gets really upset. He notices she repeats the same question 4-5 times. She denies getting lost driving but has to concentrate where she is going, realizing she had to turn previously. She used to manage finances without difficulties, her husband took over 4-5 years ago because she could not do Anim[employer]. She rarely misses her medications. She is tearful and notes waking up feeling depressed. She takes her medication and starts doing things around the house and starts feeling better. There are several family issues, they have taken care of their 2 grandchildren since 2014. Her son does not have a good relationship with them which bothers her. She gets choked up when he relates that her 29 year old mother calls her daily that the mother feels depressed, "she worries herself to death about her mother," who only wants [name] to stay with her. This has been ongoing for the past 2  years. She gets nervous worrying about her mother and grandchildren. They are on a weird schedule with their grandchildren, she gets 8 hours of sleep but does not feel rested. Her husband notes she talks in her sleep, sometimes screams out, opens her eyes and looks at him, then a few minutes later talks again "like in a zone." These only occur at night, around three times a week. She acts out her dreams. No hallucinations. She states she worries about everything. She always had "a taste of anxiety when younger," stating it just seems so complicated and she is so sorry it is going on.   She denies any headaches, dizziness, vision changes, focal numbness/tingling/weakness, bowel/bladder dysfunction, anosmia, or tremors. She has right shoulder pain. She denies any falls but is more careful walking up steps. Her father had memory issues, her maternal grandmother "was a little crazy." No history of significant head injuries or alcohol use.    PAST MEDICAL HISTORY: Past Medical History:  Diagnosis Date   Anxiety    Depression    Frequent urinary tract infections    Heartburn     PAST SURGICAL HISTORY: Past Surgical History:  Procedure Laterality Date   CHOLECYSTECTOMY     HYSTEROTOMY     MASS EXCISION  10/08/2011   Procedure: MINOR EXCISION OF MASS;  Surgeon: Cammie Sickle., MD;  Location: [facility];  Service: Orthopedics;  Laterality: Left;  excisional biopsy dorsum left long finger MCP joint    MEDICATIONS: Current Outpatient Medications on File Prior  to Visit  Medication Sig Dispense Refill   ALPRAZolam (XANAX) 0.25 MG tablet Take 0.25 mg by mouth 2 (two) times daily as needed.     Apoaequorin (PREVAGEN) 10 MG CAPS Take 50 mcg by mouth.     Biotin 1000 MCG CHEW Chew by mouth.     busPIRone (BUSPAR) 5 MG tablet Take 5 mg by mouth 3 (three) times daily.     calcium carbonate (OSCAL) 1500 (600 Ca) MG TABS tablet Take by mouth 2 (two) times daily with a meal.     folic  acid (FOLVITE) 161 MCG tablet Take 400 mcg by mouth daily.     Multiple Vitamins-Minerals (PRESERVISION AREDS PO) Take by mouth.     omeprazole (PRILOSEC) 20 MG capsule Take 20 mg by mouth daily.     PARoxetine (PAXIL) 30 MG tablet Take 30 mg by mouth every morning.     tiZANidine (ZANAFLEX) 4 MG tablet Take 1 tablet (4 mg total) by mouth every 6 (six) hours as needed for muscle spasms. (Patient not taking: Reported on 01/08/2021) 30 tablet 0   vitamin B-12 (CYANOCOBALAMIN) 500 MCG tablet Take 500 mcg by mouth daily.     No current facility-administered medications on file prior to visit.    ALLERGIES: Allergies  Allergen Reactions   Dynacin [Minocycline Hcl]    Lorabid [Loracarbef]     FAMILY HISTORY: Family History  Problem Relation Age of Onset   Dementia Father     SOCIAL HISTORY: Social History   Socioeconomic History   Marital status: Married    Spouse name: Not on file   Number of children: Not on file   Years of education: Not on file   Highest education level: Not on file  Occupational History   Not on file  Tobacco Use   Smoking status: Never   Smokeless tobacco: Never  Substance and Sexual Activity   Alcohol use: Never   Drug use: Never   Sexual activity: Not on file  Other Topics Concern   Not on file  Social History Narrative   Not on file   Social Determinants of Health   Financial Resource Strain: Not on file  Food Insecurity: Not on file  Transportation Needs: Not on file  Physical Activity: Not on file  Stress: Not on file  Social Connections: Not on file  Intimate Partner Violence: Not on file     PHYSICAL EXAM: Vitals:   01/08/21 0848  BP: (!) 164/101  Pulse: 65  SpO2: 95%   General: No acute distress, tearful and anxious Head:  Normocephalic/atraumatic Skin/Extremities: No rash, no edema Neurological Exam: Mental status: alert and oriented to person, place, and time, no dysarthria or aphasia, Fund of knowledge is appropriate.   Remote memory intact.  Attention and concentration are reduced.  Able to name objects and repeat phrases.  Rondo score 17/30 Montreal Cognitive Assessment  01/08/2021  Visuospatial/ Executive (0/5) 4  Naming (0/3) 2  Attention: Read list of digits (0/2) 1  Attention: Read list of letters (0/1) 0  Attention: Serial 7 subtraction starting at 100 (0/3) 1  Language: Repeat phrase (0/2) 0  Language : Fluency (0/1) 0  Abstraction (0/2) 2  Delayed Recall (0/5) 0  Orientation (0/6) 6  Total 16  Adjusted Score (based on education) 17    Cranial nerves: CN I: not tested CN II: pupils equal, round and reactive to light, visual fields intact CN III, IV, VI:  full range of motion, no nystagmus, no ptosis  CN V: facial sensation intact CN VII: upper and lower face symmetric CN VIII: hearing intact to conversation CN XI: sternocleidomastoid and trapezius muscles intact CN XII: tongue midline Bulk & Tone: normal, no fasciculations. Motor: 5/5 throughout with no pronator drift. Sensation: intact to light touch, cold, pin, vibration sense.  No extinction to double simultaneous stimulation.  Romberg test negative Deep Tendon Reflexes: +2 throughout, no ankle clonus Plantar responses: downgoing bilaterally Cerebellar: no incoordination on finger to nose testing Gait: narrow-based and steady, able to tandem walk adequately. Tremor: none   IMPRESSION: This is a 75 year old left-handed woman with a history of anxiety,depression, presenting for evaluation of memory loss. Her neurological exam is normal, MOCA score today 17/30 however she denies any significant difficulties with complex tasks. She is tearful today, reporting a lot of anxiety and depression with her family stressors. We discussed different causes of memory loss, including how anxiety and depression can affect cognition, which is the likely cause in her case. MRI brain without contrast will be ordered to assess for underlying structural  abnormality. She will be scheduled for Neurocognitive testing. Strongly encouraged to see a psychiatrist and therapist. Follow-up after tests, call for any changes.    Thank you for allowing me to participate in the care of this patient. Please do not hesitate to call for any questions or concerns.   Ellouise Newer, M.D.  CC: Dr. Stephanie Acre

## 2021-01-08 NOTE — Patient Instructions (Addendum)
Good to meet you.  Schedule MRI brain without contrast  2. It is very important to treat the depression and anxiety, these conditions can significantly affect our health, including memory  3. Schedule Neurocognitive testing  4. Follow-up after tests, call for any changes   RECOMMENDATIONS FOR ALL PATIENTS WITH MEMORY PROBLEMS: 1. Continue to exercise (Recommend 30 minutes of walking everyday, or 3 hours every week) 2. Increase social interactions - continue going to [place] and enjoy social gatherings with friends and family 3. Eat healthy, avoid fried foods and eat more fruits and vegetables 4. Maintain adequate blood pressure, blood sugar, and blood cholesterol level. Reducing the risk of stroke and cardiovascular disease also helps promoting better memory. 5. Avoid stressful situations. Live a simple life and avoid aggravations. Organize your time and prepare for the next day in anticipation. 6. Sleep well, avoid any interruptions of sleep and avoid any distractions in the bedroom that may interfere with adequate sleep quality 7. Avoid sugar, avoid sweets as there is a strong link between excessive sugar intake, diabetes, and cognitive impairment The Mediterranean diet has been shown to help patients reduce the risk of progressive memory disorders and reduces cardiovascular risk. This includes eating fish, eat fruits and green leafy vegetables, nuts like almonds and hazelnuts, walnuts, and also use olive oil. Avoid fast foods and fried foods as much as possible. Avoid sweets and sugar as sugar use has been linked to worsening of memory function.  There is always a concern of gradual progression of memory problems. If this is the case, then we may need to adjust level of care according to patient needs. Support, both to the patient and caregiver, should then be put into place.   We have sent a referral to [facility] for your MRI and they will call you directly to schedule your  appointment. They are located at [place]. If you need to contact them directly please call [phone].

## 2021-01-18 ENCOUNTER — Ambulatory Visit
Admission: RE | Admit: 2021-01-18 | Discharge: 2021-01-18 | Disposition: A | Discharge status: Home / Self Care | Payer: Medicare HMO | Source: Ambulatory Visit | Attending: Neurology | Admitting: Neurology

## 2021-01-18 DIAGNOSIS — G930 Cerebral cysts: Secondary | ICD-10-CM | POA: Diagnosis not present

## 2021-01-18 DIAGNOSIS — G319 Degenerative disease of nervous system, unspecified: Secondary | ICD-10-CM | POA: Diagnosis not present

## 2021-01-18 DIAGNOSIS — I6782 Cerebral ischemia: Secondary | ICD-10-CM | POA: Diagnosis not present

## 2021-01-18 DIAGNOSIS — R413 Other amnesia: Secondary | ICD-10-CM | POA: Diagnosis not present

## 2021-01-23 DIAGNOSIS — M1711 Unilateral primary osteoarthritis, right knee: Secondary | ICD-10-CM | POA: Diagnosis not present

## 2021-01-23 DIAGNOSIS — M25561 Pain in right knee: Secondary | ICD-10-CM | POA: Diagnosis not present

## 2021-01-25 DIAGNOSIS — M1711 Unilateral primary osteoarthritis, right knee: Secondary | ICD-10-CM | POA: Insufficient documentation

## 2021-01-25 HISTORY — DX: Unilateral primary osteoarthritis, right knee: M17.11

## 2021-01-27 DIAGNOSIS — K7581 Nonalcoholic steatohepatitis (NASH): Secondary | ICD-10-CM | POA: Diagnosis not present

## 2021-01-27 DIAGNOSIS — L719 Rosacea, unspecified: Secondary | ICD-10-CM | POA: Diagnosis not present

## 2021-01-27 DIAGNOSIS — K219 Gastro-esophageal reflux disease without esophagitis: Secondary | ICD-10-CM | POA: Diagnosis not present

## 2021-01-27 DIAGNOSIS — E538 Deficiency of other specified B group vitamins: Secondary | ICD-10-CM | POA: Diagnosis not present

## 2021-01-27 DIAGNOSIS — Z0000 Encounter for general adult medical examination without abnormal findings: Secondary | ICD-10-CM | POA: Diagnosis not present

## 2021-01-27 DIAGNOSIS — E559 Vitamin D deficiency, unspecified: Secondary | ICD-10-CM | POA: Diagnosis not present

## 2021-01-27 DIAGNOSIS — R69 Illness, unspecified: Secondary | ICD-10-CM | POA: Diagnosis not present

## 2021-01-27 DIAGNOSIS — E7800 Pure hypercholesterolemia, unspecified: Secondary | ICD-10-CM | POA: Diagnosis not present

## 2021-01-27 DIAGNOSIS — I4510 Unspecified right bundle-branch block: Secondary | ICD-10-CM | POA: Diagnosis not present

## 2021-01-27 DIAGNOSIS — L570 Actinic keratosis: Secondary | ICD-10-CM | POA: Diagnosis not present

## 2021-02-03 NOTE — Progress Notes (Signed)
Pt advised of Mri, voiced understanding of results.

## 2021-02-19 ENCOUNTER — Emergency Department (HOSPITAL_COMMUNITY)
Admission: EM | Admit: 2021-02-19 | Discharge: 2021-02-19 | Disposition: A | Discharge status: Home / Self Care | Payer: Medicare HMO | Attending: Emergency Medicine | Admitting: Emergency Medicine

## 2021-02-19 ENCOUNTER — Encounter (HOSPITAL_COMMUNITY): Payer: Self-pay | Admitting: *Deleted

## 2021-02-19 ENCOUNTER — Ambulatory Visit: Admission: EM | Admit: 2021-02-19 | Discharge: 2021-02-19 | Payer: Medicare HMO

## 2021-02-19 ENCOUNTER — Encounter: Payer: Self-pay | Admitting: Emergency Medicine

## 2021-02-19 ENCOUNTER — Other Ambulatory Visit: Payer: Self-pay

## 2021-02-19 DIAGNOSIS — I959 Hypotension, unspecified: Secondary | ICD-10-CM | POA: Diagnosis not present

## 2021-02-19 DIAGNOSIS — R42 Dizziness and giddiness: Secondary | ICD-10-CM | POA: Diagnosis not present

## 2021-02-19 LAB — URINALYSIS, ROUTINE W REFLEX MICROSCOPIC
Bilirubin Urine: NEGATIVE
Glucose, UA: NEGATIVE mg/dL
Hgb urine dipstick: NEGATIVE
Ketones, ur: NEGATIVE mg/dL
Nitrite: NEGATIVE
Protein, ur: NEGATIVE mg/dL
Specific Gravity, Urine: 1.004 — ABNORMAL LOW (ref 1.005–1.030)
pH: 7 (ref 5.0–8.0)

## 2021-02-19 LAB — CBC WITH DIFFERENTIAL/PLATELET
Abs Immature Granulocytes: 0.02 10*3/uL (ref 0.00–0.07)
Basophils Absolute: 0 10*3/uL (ref 0.0–0.1)
Basophils Relative: 0 %
Eosinophils Absolute: 0.1 10*3/uL (ref 0.0–0.5)
Eosinophils Relative: 2 %
HCT: 38 % (ref 36.0–46.0)
Hemoglobin: 12.5 g/dL (ref 12.0–15.0)
Immature Granulocytes: 0 %
Lymphocytes Relative: 38 %
Lymphs Abs: 2.4 10*3/uL (ref 0.7–4.0)
MCH: 32.8 pg (ref 26.0–34.0)
MCHC: 32.9 g/dL (ref 30.0–36.0)
MCV: 99.7 fL (ref 80.0–100.0)
Monocytes Absolute: 0.5 10*3/uL (ref 0.1–1.0)
Monocytes Relative: 8 %
Neutro Abs: 3.3 10*3/uL (ref 1.7–7.7)
Neutrophils Relative %: 52 %
Platelets: 247 10*3/uL (ref 150–400)
RBC: 3.81 MIL/uL — ABNORMAL LOW (ref 3.87–5.11)
RDW: 13.1 % (ref 11.5–15.5)
WBC: 6.4 10*3/uL (ref 4.0–10.5)
nRBC: 0 % (ref 0.0–0.2)

## 2021-02-19 LAB — BASIC METABOLIC PANEL
Anion gap: <3 — ABNORMAL LOW (ref 5–15)
BUN: 11 mg/dL (ref 8–23)
CO2: 23 mmol/L (ref 22–32)
Calcium: 7.4 mg/dL — ABNORMAL LOW (ref 8.9–10.3)
Chloride: 113 mmol/L — ABNORMAL HIGH (ref 98–111)
Creatinine, Ser: 0.67 mg/dL (ref 0.44–1.00)
GFR, Estimated: >60 mL/min (ref >60)
Glucose, Bld: 69 mg/dL — ABNORMAL LOW (ref 70–99)
Potassium: 3.2 mmol/L — ABNORMAL LOW (ref 3.5–5.1)
Sodium: 137 mmol/L (ref 135–145)

## 2021-02-19 MED ORDER — LACTATED RINGERS IV BOLUS
1000.0000 mL | Freq: Once | INTRAVENOUS | Status: AC
  Administered 2021-02-19: 1000 mL via INTRAVENOUS

## 2021-02-19 NOTE — ED Notes (Signed)
Patient is being discharged from the Urgent Care and sent to the Emergency Department via private vehicle.  Patient refused EMS transport . Per PA, patient is in need of higher level of care due to lower blood pressure and elevated sugar. Patient is aware and verbalizes understanding of plan of care.  Vitals:   02/19/21 1919  BP: (!) 87/57  Pulse: 70  Resp: 16  Temp: 98.4 F (36.9 C)  SpO2: 97%

## 2021-02-19 NOTE — ED Notes (Signed)
Report given to Forestine Na ED RN.

## 2021-02-19 NOTE — ED Notes (Signed)
Pt to tx room

## 2021-02-19 NOTE — ED Notes (Signed)
Pt ambulated to restroom with stand-by assist. Pt tolerated well. No complaints of dizziness

## 2021-02-19 NOTE — ED Triage Notes (Signed)
Pt with first episode of dizziness after lunch, pt not able to state time.
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
## 2021-02-19 NOTE — ED Triage Notes (Signed)
Pt sent here due to BP low.  Elevated blood sugar. No hx of diabetes. Pt with c/o dizziness. Took a nap that pt usually does not do.  Pt states her head "feels weird"

## 2021-02-19 NOTE — ED Triage Notes (Signed)
C/o 2 dizzy spells and not be able to collect her thoughts.  States she has not been eating well.  States she felt like she was going to pass out today and she took a long nap today and states this is not how she usually is.  Blood sugar 215.

## 2021-02-25 DIAGNOSIS — R413 Other amnesia: Secondary | ICD-10-CM | POA: Diagnosis not present

## 2021-02-25 DIAGNOSIS — E876 Hypokalemia: Secondary | ICD-10-CM | POA: Diagnosis not present

## 2021-02-25 DIAGNOSIS — I679 Cerebrovascular disease, unspecified: Secondary | ICD-10-CM | POA: Diagnosis not present

## 2021-05-25 ENCOUNTER — Encounter: Payer: Self-pay | Admitting: Psychology

## 2021-05-25 ENCOUNTER — Ambulatory Visit (INDEPENDENT_AMBULATORY_CARE_PROVIDER_SITE_OTHER): Payer: Medicare HMO | Admitting: Psychology

## 2021-05-25 ENCOUNTER — Other Ambulatory Visit: Payer: Self-pay

## 2021-05-25 ENCOUNTER — Ambulatory Visit: Payer: Medicare HMO | Admitting: Psychology

## 2021-05-25 DIAGNOSIS — I679 Cerebrovascular disease, unspecified: Secondary | ICD-10-CM | POA: Insufficient documentation

## 2021-05-25 DIAGNOSIS — L719 Rosacea, unspecified: Secondary | ICD-10-CM | POA: Insufficient documentation

## 2021-05-25 DIAGNOSIS — M5431 Sciatica, right side: Secondary | ICD-10-CM | POA: Insufficient documentation

## 2021-05-25 DIAGNOSIS — E785 Hyperlipidemia, unspecified: Secondary | ICD-10-CM | POA: Insufficient documentation

## 2021-05-25 DIAGNOSIS — E538 Deficiency of other specified B group vitamins: Secondary | ICD-10-CM | POA: Insufficient documentation

## 2021-05-25 DIAGNOSIS — G3184 Mild cognitive impairment, so stated: Secondary | ICD-10-CM

## 2021-05-25 DIAGNOSIS — L570 Actinic keratosis: Secondary | ICD-10-CM | POA: Insufficient documentation

## 2021-05-25 DIAGNOSIS — R4189 Other symptoms and signs involving cognitive functions and awareness: Secondary | ICD-10-CM

## 2021-05-25 DIAGNOSIS — Z860100 Personal history of colon polyps, unspecified: Secondary | ICD-10-CM | POA: Insufficient documentation

## 2021-05-25 DIAGNOSIS — E7800 Pure hypercholesterolemia, unspecified: Secondary | ICD-10-CM | POA: Insufficient documentation

## 2021-05-25 DIAGNOSIS — K7581 Nonalcoholic steatohepatitis (NASH): Secondary | ICD-10-CM | POA: Insufficient documentation

## 2021-05-25 DIAGNOSIS — I4510 Unspecified right bundle-branch block: Secondary | ICD-10-CM | POA: Insufficient documentation

## 2021-05-25 DIAGNOSIS — J301 Allergic rhinitis due to pollen: Secondary | ICD-10-CM | POA: Insufficient documentation

## 2021-05-25 DIAGNOSIS — K219 Gastro-esophageal reflux disease without esophagitis: Secondary | ICD-10-CM | POA: Insufficient documentation

## 2021-05-25 DIAGNOSIS — F411 Generalized anxiety disorder: Secondary | ICD-10-CM | POA: Insufficient documentation

## 2021-05-25 DIAGNOSIS — K921 Melena: Secondary | ICD-10-CM | POA: Insufficient documentation

## 2021-05-25 DIAGNOSIS — E559 Vitamin D deficiency, unspecified: Secondary | ICD-10-CM | POA: Insufficient documentation

## 2021-05-25 DIAGNOSIS — Z8616 Personal history of COVID-19: Secondary | ICD-10-CM | POA: Insufficient documentation

## 2021-05-25 DIAGNOSIS — N3281 Overactive bladder: Secondary | ICD-10-CM | POA: Insufficient documentation

## 2021-05-25 DIAGNOSIS — K5901 Slow transit constipation: Secondary | ICD-10-CM | POA: Insufficient documentation

## 2021-05-25 DIAGNOSIS — R7303 Prediabetes: Secondary | ICD-10-CM | POA: Insufficient documentation

## 2021-05-25 DIAGNOSIS — R739 Hyperglycemia, unspecified: Secondary | ICD-10-CM | POA: Insufficient documentation

## 2021-05-25 DIAGNOSIS — F329 Major depressive disorder, single episode, unspecified: Secondary | ICD-10-CM | POA: Insufficient documentation

## 2021-05-25 HISTORY — DX: Mild cognitive impairment of uncertain or unknown etiology: G31.84

## 2021-05-25 NOTE — Progress Notes (Signed)
NEUROPSYCHOLOGICAL EVALUATION [place]. [place] Department of Neurology  Date of Evaluation: May 25, 2021  Reason for Referral:   Diana Smith is a 75 y.o. left-handed Caucasian female referred by Ellouise Newer, M.D., to characterize her current cognitive functioning and assist with diagnostic clarity and treatment planning in the context of subjective cognitive decline and potential psychiatric comorbidities.   Assessment and Plan:   Clinical Impression(s): Diana Smith' pattern of performance is suggestive of primary impairments surrounding executive functioning, as well as both encoding (i.e., learning) and retrieval aspects of verbal memory. Further performance variability was exhibited across verbal fluency, with semantic fluency exhibiting greater impairment relative to phonemic fluency. Performance was generally consistent with premorbid intellectual estimations across domains of processing speed, attention/concentration, receptive language, confrontation naming, visuospatial abilities, and visual memory. Diana Smith reported that her husband had taken over financial management and bill paying responsibilities. However, she continues to be independent with medication management and her lack of driving seems more acute and related to an unspecified recent confusional "spell" rather than consistent and longstanding cognitive impairment. As such, I believe that she best meets criteria for a Mild Neurocognitive Disorder ("mild cognitive impairment") at the present time. However, she is likely towards the severe end of this spectrum and certainly at risk to transition to a dementia designation over the next several years.   The etiology for cognitive decline is unclear. Despite verbal memory impairment, she responded reasonably well when provided with cues. This does not suggest strong evidence for a memory storage deficit at the present time. Rather, there is  significant difficulty learning and later accessing learned information upon demand. The lack of a clearly evident storage deficit, coupled with appropriate performances across confrontation naming and visuospatial abilities is inconsistent with typically presenting Alzheimer's disease. However, I certainly cannot rule out this disease process. If Alzheimer's disease is indeed present, it would seem to be in early stages. Memory and language dysfunction can be seen in a primary progressive aphasia (PPA) presentation, particularly the logopenic subtype. While this could remain on her differential, it seems unlikely at the present time given that receptive language and confrontation naming scores were largely appropriate. Her age would also be advanced relative to when this disease typically presents.   Her patterns across testing are not inconsistent with a primary vascular etiology. However, she has no known stroke history and recent neuroimaging revealed "mild for age" small vessel ischemia. While cerebrovascular changes can contribute to cognitive dysfunction, impairment seems beyond what would be expected from this cause alone given current imaging. I do not see compelling evidence for Lewy body dementia or the behavioral variant of frontotemporal dementia.  While she reported acute symptoms of mild depression and anxiety across questionnaires, I do not believe that these factors are solely responsible for cognitive changes. In all likelihood, they are exacerbating an underlying neurologic cause for dysfunction which has not fully revealed itself yet. Continued medical monitoring will be important moving forward.  Recommendations: A repeat neuropsychological evaluation in 18-24 months (or sooner if functional decline is noted) is recommended to assess the trajectory of future cognitive decline should it occur. This will also aid in future efforts towards improved diagnostic clarity.  Diana Smith could  talk with Dr. Delice Lesch the potential benefits of an acetylcholinesterase inhibitor medication such as donepezil/Aricept given significant verbal memory impairment. It is important to highlight that this medication has been shown to slow functional decline in some individuals. There is no treatment which  can stop or reverse cognitive decline. While her medication list current states that she is taking Prevagen, there is no evidence to suggest that this over-the-counter medication is beneficial in treating memory loss.   Diana Smith is also prescribed Xanax. This medication has well-known and established cognitive side effects. I would encourage her to discuss alternative medications with her PCP for treating mood/sleep concerns.  Should there be a progression of current deficits over time, Diana Smith is unlikely to regain any independent living skills lost. Therefore, it is recommended that she remain as involved as possible in all aspects of household chores, finances, and medication management, with supervision to ensure adequate performance. She will likely benefit from the establishment and maintenance of a routine in order to maximize her functional abilities over time.  It will be important for Diana Smith to have another person with her when in situations where she may need to process information, weigh the pros and cons of different options, and make decisions, in order to ensure that she fully understands and recalls all information to be considered.  Diana Smith is encouraged to attend to lifestyle factors for brain health (e.g., regular physical exercise, good nutrition habits, regular participation in cognitively-stimulating activities, and general stress management techniques), which are likely to have benefits for both emotional adjustment and cognition. In fact, in addition to promoting good general health, regular exercise incorporating aerobic activities (e.g., brisk walking, jogging, cycling,  etc.) has been demonstrated to be a very effective treatment for depression and stress, with similar efficacy rates to both antidepressant medication and psychotherapy. Optimal control of vascular risk factors (including safe cardiovascular exercise and adherence to dietary recommendations) is encouraged. Continued participation in activities which provide mental stimulation and social interaction is also recommended.   Performance across neurocognitive testing is not a strong predictor of an individual's safety operating a motor vehicle. Should her family wish to pursue a formalized driving evaluation, they could reach out to the following agencies: [employer] in [place]: [phone] Driver [facility]habil[facility]: [facility]: [place]: [phone] or [phone]  When learning new information, she would benefit from information being broken up into small, manageable pieces. She may also find it helpful to articulate the material in her own words and in a context to promote encoding at the onset of a new task. This material may need to be repeated multiple times to promote encoding.  Memory can be improved using internal strategies such as rehearsal, repetition, chunking, mnemonics, association, and imagery. External strategies such as written notes in a consistently used memory journal, visual and nonverbal auditory cues such as a calendar on the refrigerator or appointments with alarm, such as on a cell phone, can also help maximize recall.    To address problems with executive dysfunction, she may wish to consider:   -Avoiding external distractions when needing to concentrate   -Limiting exposure to fast paced environments with multiple sensory demands   -Writing down complicated information and using checklists   -Attempting and completing one task at a time (i.e., no multi-tasking)   -Verbalizing aloud each step of a task to  maintain focus   -Reducing the amount of information considered at one time  Review of Records:   Diana Smith was seen by [facility] Marland KitchenEllouise Newer, M.D.) on 01/08/2021 for an evaluation of memory loss. Her husband started noticing changes around two years ago. He stated that she will repeat the same question 4-5 times. She denied getting  lost driving but has to concentrate where she is going and will miss turns occasionally. She used to manage finances without difficulties. 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 She rarely misses her medications. Her husband also reported some personality changes in that Diana Smith "was just so upset I thought she was bipolar" (i.e., she would get very upset then calm down). She was started on Paxil which yielded a noticeable improvement. Despite this, she is commonly tearful and reported waking up feeling depressed. There are several family issues which add to her overall degree of stress and anxiety. Much of this surrounds her caring for her 18 year old mother, as well as her two grandchildren. Diana Smith gets about eight hours of sleep per night but does not feel rested. Her husband noted that she will talk and sometimes scream out in her sleep. There was mention that she acts out her dreams. Hallucinations were denied.   Diana Smith denied ongoing headaches, dizziness, vision changes, focal numbness/tingling/weakness, bowel/bladder dysfunction, anosmia, tremors, prior head injuries, or prior substance abuse. She has right shoulder pain. Performance on a brief cognitive screening instrument (MOCA) was 17/30. Ultimately, Diana Smith was referred for a comprehensive neuropsychological evaluation to characterize her cognitive abilities and to assist with diagnostic clarity and treatment planning.   Brain MRI on 01/19/2021 revealed mild-to-moderate generalized atrophy and mild chronic small vessel ischemia.   Past  Medical History:  Diagnosis Date   Actinic keratosis    Allergic rhinitis due to pollen    Cerebrovascular disease    Frequent urinary tract infections    Gastro-esophageal reflux disease without esophagitis    Generalized anxiety disorder    Heartburn    Hematochezia    History of COVID-19    Hyperglycemia    Hyperlipidemia    Major depressive disorder    Nonalcoholic steatohepatitis (NASH)    Osteoarthritis of right knee 01/25/2021   Overactive bladder    Personal history of colonic polyps    Prediabetes    Pure hypercholesterolemia    Right bundle branch block    Rosacea    Sciatica, right side    Slow transit constipation    Vitamin B12 deficiency (non anemic)    Vitamin D deficiency     Past Surgical History:  Procedure Laterality Date   HYSTEROTOMY     MASS EXCISION  10/08/2011   Procedure: MINOR EXCISION OF MASS;  Surgeon: Cammie Sickle., MD;  Location: [facility];  Service: Orthopedics;  Laterality: Left;  excisional biopsy dorsum left long finger MCP joint    Current Outpatient Medications:    ALPRAZolam (XANAX) 0.25 MG tablet, Take 0.25 mg by mouth See admin instructions. Take 1/2 tablet by mouth daily if needed take 1 tablet as needed for anxiety, Disp: , Rfl:    Apoaequorin (PREVAGEN) 10 MG CAPS, Take 50 mcg by mouth. (Patient not taking: Reported on 02/19/2021), Disp: , Rfl:    Biotin 1000 MCG CHEW, Chew by mouth., Disp: , Rfl:    busPIRone (BUSPAR) 5 MG tablet, Take 5 mg by mouth 3 (three) times daily., Disp: , Rfl:    calcium carbonate (OSCAL) 1500 (600 Ca) MG TABS tablet, Take by mouth 2 (two) times daily with a meal., Disp: , Rfl:    folic acid (FOLVITE) 195 MCG tablet, Take 400 mcg by mouth daily., Disp: , Rfl:    Multiple Vitamins-Minerals (PRESERVISION AREDS PO), Take by mouth., Disp: , Rfl:  omeprazole (PRILOSEC) 20 MG capsule, Take 20 mg by mouth daily., Disp: , Rfl:    PARoxetine (PAXIL) 30 MG tablet, Take 30 mg by mouth every  morning., Disp: , Rfl:    tiZANidine (ZANAFLEX) 4 MG tablet, Take 1 tablet (4 mg total) by mouth every 6 (six) hours as needed for muscle spasms. (Patient not taking: No sig reported), Disp: 30 tablet, Rfl: 0   vitamin B-12 (CYANOCOBALAMIN) 500 MCG tablet, Take 500 mcg by mouth daily., Disp: , Rfl:   Clinical Interview:   The following information was obtained during a clinical interview with Diana Smith and her husband prior to cognitive testing.  Cognitive Symptoms: Decreased short-term memory: Endorsed. Diana Smith reported increased difficulties losing her train of thought and finding her words. Her husband reported that she commonly repeats the same questions or statements numerous times over a short period.  Decreased long-term memory: Denied. Decreased attention/concentration: Endorsed. She reported trouble with sustained attention and increased distractibility. She noted being forced to stop and focus her attention, otherwise she will be unable to focus effectively.  Reduced processing speed: Endorsed. Difficulties with executive functions: Endorsed. She reported trouble with organization and indecisiveness. She also reported trouble with impulsivity but was unable to provide any examples or further explanation. Her husband noted mild personality changes surrounding Diana Smith seeming less confident, along with having episodes of increased agitation/frustration.  Difficulties with emotion regulation: Denied. Difficulties with receptive language: Denied. Difficulties with word finding: Endorsed. Decreased visuoperceptual ability: Denied.  Trajectory of deficits: Her husband reported that memory dysfunction has been gradually worsening at least over the past 1.5 years. Diana Smith reported being unsure if memory changes were age-appropriate, due to ongoing depression and familial stressors, or a combination of both. Per notes from Dr. Delice Lesch, ADL dysfunction surrounding financial management  seems to be present for the past 4-5 years.   Difficulties completing ADLs: Endorsed. Diana Smith is generally able to manage her medications independently without significant difficulty. As stated above, her husband took over financial management and bill paying responsibilities about 4-5 years ago due to Diana Smith. Olesen exhibiting difficulties navigating online bill paying services. She had previously managed these independently. She has recently stopped driving at the advice of her medical team. A few months ago, she reported experiencing a "spell" where she "felt funny" and exhibited lower extremity weakness. The cause for this was unknown and she did not report other seizure-like symptoms. Prior to this, she was driving independently. However, there were concerns surrounding increasing navigational difficulties.   Additional Medical History: History of traumatic brain injury/concussion: Denied. History of stroke: Denied. History of seizure activity: Denied. History of known exposure to toxins: Denied. Symptoms of chronic pain: Endorsed. Specifically, she noted bilateral knee pain and that she was awaiting knee replacement surgeries. She stated that she could not start on this until she had an unspecified tooth issue corrected.  Experience of frequent headaches/migraines: Denied. Frequent instances of dizziness/vertigo: Denied.  Sensory changes: She wears glasses with benefit. Mild hearing loss was also reported. Other sensory changes/difficulties (e.g., taste or smell) were denied.  Balance/coordination difficulties: Endorsed. She reported falling several times in the fairly recent past. Much of this was attributed to ongoing knee discomfort. Her husband added his belief that her shoes are contributory to falling behaviors as some of her shoes offer no support. One side of the body was not said to be worse than the other.  Other motor difficulties: Denied.  Sleep History: Estimated hours obtained  each  night: 8 hours.  Difficulties falling asleep: Denied with the assistance of medication.  Difficulties staying asleep: Denied. Feels rested and refreshed upon awakening: Denied. She reported waking still feeling tired. However, she did later attribute this to feeling depressed rather than poor quality sleep.   History of snoring: Denied. History of waking up gasping for air: Denied. Witnessed breath cessation while asleep: Denied.  History of vivid dreaming: Endorsed. Her husband noted that she will often (i.e., several times per week) talk and scream in her sleep.  Excessive movement while asleep: Denied outside of symptoms which were attributed to a history of restless leg syndrome.  Instances of acting out her dreams: Denied.  Psychiatric/Behavioral Health History: Depression: Endorsed. She reported a somewhat longstanding history of depressive symptoms. She noted a significant improvement since starting Paxil. Both she and her husband reported a notable personality change whenever she may forget or is late in taking this medication. She described her current mood as "depressed" and that she regularly experiences "sadness." They noted several recent deaths in the family, as well as numerous other significant familial and psychosocial stressors which negatively influence her mood. Current or remote suicidal ideation, intent, or plan was denied.  Anxiety: Endorsed. Symptoms were largely generalized and somewhat longstanding in nature. Memory concerns have acutely exacerbated anxiety, particularly as her brother-in-law (not blood related) passed away with early-onset Alzheimer's disease, which has led this to be on the forefront of her mind.  Mania: Denied. Trauma History: Denied. Visual/auditory hallucinations: Denied. Delusional thoughts: Denied.  Tobacco: Denied. Alcohol: She denied current alcohol consumption as well as a history of problematic alcohol abuse or dependence.    Recreational drugs: Denied.  Family History: Problem Relation Age of Onset   Dementia Father    Memory loss Father    This information was confirmed by Diana Smith. Whitson.  Academic/Vocational History: Highest level of educational attainment: 12 years. She graduated from high school and described herself as an average (B/C) student in academic settings. Reading comprehension perhaps represented a mild relative weakness.  History of developmental delay: Denied. History of grade repetition: Denied. Enrollment in special education courses: Denied. History of LD/ADHD: Denied.  Employment: Retired. 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   Evaluation Results:   Behavioral Observations: Diana Smith. Reaney was accompanied by her husband, arrived to her appointment on time, and was appropriately dressed and groomed. She appeared alert and oriented. Observed gait and station were within normal limits. Gross motor functioning appeared intact upon informal observation and no abnormal movements (e.g., tremors) were noted. Her affect was generally positive, but did range appropriately given the subject being discussed during the clinical interview. She did become tearful at one point when discussing various stressors and concerns surrounding her memory. Spontaneous speech was fluent. Word finding difficulties were observed during interview and worsened with increased stress. Thought processes were coherent, organized, and normal in content. Insight into her cognitive difficulties appeared adequate.   During testing, she was tangential and somewhat scattered with her thoughts. She commonly forgot task instructions and was unable to comprehend a few, more complex tasks (TMT B, D-KEFS Color Word). Sustained attention was generally appropriate. Task engagement was adequate and she persisted when challenged. Overall, Diana Smith. Dieckman was cooperative with the clinical interview and  subsequent testing procedures.   Adequacy of Effort: The validity of neuropsychological testing is limited by the extent to which the individual being tested may be assumed to have exerted adequate effort during  testing. Diana Smith. Hernan expressed her intention to perform to the best of her abilities and exhibited adequate task engagement and persistence. Scores across stand-alone and embedded performance validity measures were within expectation. As such, the results of the current evaluation are believed to be a valid representation of Diana Smith. Abruzzese' current cognitive functioning.  Test Results: Diana Smith. Ayad was mildly disoriented at the time of the current evaluation. She was unable to recall her phone number and could not accurately estimate the current time.   Intellectual abilities based upon educational and vocational attainment were estimated to be in the average range. Premorbid abilities were estimated to be within the below average range based upon a single-word reading test.   Processing speed was below average to average. Basic attention was mildly variable but overall appropriate, ranging from the below average to above average normative ranges. More complex attention (e.g., working memory) was below average to average. Executive functioning was variable but generally below expectation. Abstract reasoning was below average, while cognitive flexibility and response inhibition were exceptionally low to well below average. She performed in the below average range across a task assessing safety and judgment, with points being lost largely due to the provision of overly vague responses.  Assessed receptive language abilities were below average. Assessed expressive language was variable. Phonemic fluency was well below average to below average, semantic fluency was exceptionally low to below average, and confrontation naming was below average to average.      Assessed visuospatial/visuoconstructional  abilities were mildly variable but overall appropriate, ranging from the below average to well above average normative ranges.   Learning (i.e., encoding) of novel verbal information was exceptionally low. Spontaneous delayed recall (i.e., retrieval) of previously learned information was exceptionally low to well below average across verbal tasks and below average across a visual task. Retention rates were 0% across a story learning task, 33% (raw score of 1) across a list learning task, and 35% across a figure drawing task. Performance across recognition tasks was generally appropriate, ranging from the below average to average normative ranges, suggesting some evidence for information consolidation.   Results of emotional screening instruments suggested that recent symptoms of generalized anxiety were in the mild range, while symptoms of depression were also within the mild range. A screening instrument assessing recent sleep quality suggested the presence of minimal sleep dysfunction.  Tables of Scores:   Note: This summary of test scores accompanies the interpretive report and should not be considered in isolation without reference to the appropriate sections in the text. Descriptors are based on appropriate normative data and may be adjusted based on clinical judgment. Terms such as "Within Normal Limits" and "Outside Normal Limits" are used when a more specific description of the test score cannot be determined.       Percentile - Normative Descriptor > 98 - Exceptionally High 91-97 - Well Above Average 75-90 - Above Average 25-74 - Average 9-24 - Below Average 2-8 - Well Below Average < 2 - Exceptionally Low       Validity:   DESCRIPTOR       Dot Counting Test: --- --- Within Normal Limits  RBANS Effort Index: --- --- Within Normal Limits  WAIS-IV Reliable Digit Span: --- --- Within Normal Limits       Orientation:      Raw Score Percentile   NAB Orientation, Form 1 24/29 --- ---        Cognitive Screening:      Raw Score Percentile  SLUMS: 14/30 --- ---       RBANS, Form A: Standard Score/ Scaled Score Percentile   Total Score 70 2 Well Below Average  Immediate Memory 49 <1 Exceptionally Low    List Learning 1 <1 Exceptionally Low    Story Memory 3 1 Exceptionally Low  Visuospatial/Constructional 102 55 Average    Figure Copy 14 91 Well Above Average    Line Orientation 13/20 10-16 Below Average  Language 85 16 Below Average    Picture Naming 9/10 26-50 Average    Semantic Fluency 4 2 Well Below Average  Attention 88 21 Below Average    Digit Span 7 16 Below Average    Coding 9 37 Average  Delayed Memory 60 <1 Exceptionally Low    List Recall 1/10 3-9 Well Below Average    List Recognition 17/20 10-16 Below Average    Story Recall 1 <1 Exceptionally Low    Story Recognition 8/12 8-15 Below Average    Figure Recall 6 9 Below Average    Figure Recognition 6/8 30-52 Average       Intellectual Functioning:      Standard Score Percentile   Test of Premorbid Functioning: 87 19 Below Average       Attention/Executive Function:     Trail Making Test (TMT): Raw Score (T Score) Percentile     Part A 51 secs.,  1 error (43) 25 Average    Part B Discontinued --- Impaired         Scaled Score Percentile   WAIS-IV Digit Span: 9 37 Average    Forward 12 75 Above Average    Backward 8 25 Average    Sequencing 6 9 Below Average        Scaled Score Percentile   WAIS-IV Similarities: 6 9 Below Average       D-KEFS Color-Word Interference Test: Raw Score (Scaled Score) Percentile     Color Naming 41 secs. (7) 16 Below Average    Word Reading 24 secs. (11) 63 Average    Inhibition 110 secs. (4) 2 Well Below Average      Total Errors 18 errors (1) <1 Exceptionally Low    Inhibition/Switching Discontinued --- Impaired      Total Errors --- --- ---       D-KEFS Verbal Fluency Test: Raw Score (Scaled Score) Percentile     Letter Total Correct 21 (6) 9 Below  Average    Category Total Correct 26 (7) 16 Below Average    Category Switching Total Correct 7 (4) 2 Well Below Average    Category Switching Accuracy 6 (5) 5 Well Below Average      Total Set Loss Errors 5 (6) 9 Below Average      Total Repetition Errors 3 (10) 50 Average       NAB Executive Functions Module, Form 1: T Score Percentile     Judgment 41 18 Below Average       Language:     Verbal Fluency Test: Raw Score (T Score) Percentile     Phonemic Fluency (FAS) 21 (33) 5 Well Below Average    Animal Fluency 10 (29) 2 Exceptionally Low        NAB Language Module, Form 1: T Score Percentile     Auditory Comprehension 37 9 Below Average    Naming 26/31 (37) 9 Below Average       Visuospatial/Visuoconstruction:      Raw Score Percentile   Clock Drawing: 10/10 ---  Within Normal Limits        Scaled Score Percentile   WAIS-IV Block Design: 11 63 Average       Mood and Personality:      Raw Score Percentile   Geriatric Depression Scale: 14 --- Mild  Geriatric Anxiety Scale: 18 --- Mild    Somatic 5 --- Minimal    Cognitive 8 --- Moderate    Affective 5 --- Mild       Additional Questionnaires:      Raw Score Percentile   PROMIS Sleep Disturbance Questionnaire: 13 --- None to Slight   Informed Consent and Coding/Compliance:   The current evaluation represents a clinical evaluation for the purposes previously outlined by the referral source and is in no way reflective of a forensic evaluation.   Diana Smith. Leeth was provided with a verbal description of the nature and purpose of the present neuropsychological evaluation. Also reviewed were the foreseeable risks and/or discomforts and benefits of the procedure, limits of confidentiality, and mandatory reporting requirements of this provider. The patient was given the opportunity to ask questions and receive answers about the evaluation. Oral consent to participate was provided by the patient.   This evaluation was conducted by  Christia Reading, Ph.D., ABPP-CN, board certified clinical neuropsychologist. Diana Smith. Ocasio completed a clinical interview with Dr. Melvyn Novas, billed as one unit [phone], and 135 minutes of cognitive testing and scoring, billed as one unit [phone] and four additional units [id number]. Psychometrist Milana Kidney, B.S., assisted Dr. Melvyn Novas with test administration and scoring procedures. As a separate and discrete service, Dr. Melvyn Novas spent a total of 160 minutes in interpretation and report writing billed as one unit [phone] and two units [id number].

## 2021-05-25 NOTE — Progress Notes (Signed)
   Psychometrician Note   Cognitive testing was administered to Diana Smith by Milana Kidney, B.S. (psychometrist) under the supervision of Dr. Christia Reading, Ph.D., licensed psychologist on 05/25/21. Diana Smith did not appear overtly distressed by the testing session per behavioral observation or responses across self-report questionnaires. Rest breaks were offered.    The battery of tests administered was selected by Dr. Christia Reading, Ph.D. with consideration to Diana Smith's current level of functioning, the nature of her symptoms, emotional and behavioral responses during interview, level of literacy, observed level of motivation/effort, and the nature of the referral question. This battery was communicated to the psychometrist. Communication between Dr. Christia Reading, Ph.D. and the psychometrist was ongoing throughout the evaluation and Dr. Christia Reading, Ph.D. was immediately accessible at all times. Dr. Christia Reading, Ph.D. provided supervision to the psychometrist on the date of this service to the extent necessary to assure the quality of all services provided.    Diana Smith will return within approximately 1-2 weeks for an interactive feedback session with Dr. Melvyn Novas at which time her test performances, clinical impressions, and treatment recommendations will be reviewed in detail. Diana Smith understands she can contact our office should she require our assistance before this time.  A total of 135 minutes of billable time were spent face-to-face with Diana Smith by the psychometrist. This includes both test administration and scoring time. Billing for these services is reflected in the clinical report generated by Dr. Christia Reading, Ph.D.  This note reflects time spent with the psychometrician and does not include test scores or any clinical interpretations made by Dr. Melvyn Novas. The full report will follow in a separate note.

## 2021-06-01 ENCOUNTER — Other Ambulatory Visit: Payer: Self-pay | Admitting: Family Medicine

## 2021-06-01 ENCOUNTER — Ambulatory Visit (INDEPENDENT_AMBULATORY_CARE_PROVIDER_SITE_OTHER): Payer: Medicare HMO | Admitting: Psychology

## 2021-06-01 ENCOUNTER — Other Ambulatory Visit: Payer: Self-pay

## 2021-06-01 ENCOUNTER — Telehealth: Payer: Self-pay | Admitting: Neurology

## 2021-06-01 DIAGNOSIS — G3184 Mild cognitive impairment, so stated: Secondary | ICD-10-CM | POA: Diagnosis not present

## 2021-06-01 DIAGNOSIS — Z1231 Encounter for screening mammogram for malignant neoplasm of breast: Secondary | ICD-10-CM

## 2021-06-01 NOTE — Progress Notes (Signed)
   Neuropsychology Feedback Session Tillie Rung. [place] [facility]  Reason for Referral:   Diana Smith is a 75 y.o. left-handed Caucasian female referred by Ellouise Newer, M.D., to characterize her current cognitive functioning and assist with diagnostic clarity and treatment planning in the context of subjective cognitive decline and potential psychiatric comorbidities.   Feedback:   Diana Smith completed a comprehensive neuropsychological evaluation on 05/25/2021. Please refer to that encounter for the full report and recommendations. Briefly, results suggested primary impairments surrounding executive functioning, as well as both encoding (i.e., learning) and retrieval aspects of verbal memory. Further performance variability was exhibited across verbal fluency, with semantic fluency exhibiting greater impairment relative to phonemic fluency. The etiology for cognitive decline is unclear. Despite verbal memory impairment, she responded reasonably well when provided with cues. This does not suggest strong evidence for a memory storage deficit at the present time. Rather, there is significant difficulty learning and later accessing learned information upon demand. The lack of a clearly evident storage deficit, coupled with appropriate performances across confrontation naming and visuospatial abilities is inconsistent with typically presenting Alzheimer's disease. However, I certainly cannot rule out this disease process. If Alzheimer's disease is indeed present, it would seem to be in early stages.  Diana Smith was accompanied by her husband during the current feedback session. Content of the current session focused on the results of her neuropsychological evaluation. Diana Smith was given the opportunity to ask questions and her questions were answered. She was encouraged to reach out should additional questions arise. A copy of her report was provided at the  conclusion of the visit.      30 minutes were spent conducting the current feedback session with Diana Smith, billed as one unit [phone].

## 2021-06-01 NOTE — Telephone Encounter (Signed)
Pt saw Melvyn Novas today, when leaving they asked if Iona is able to go ahead and get medication. Melvyn Novas stated he was going to Reach out to [place] as well.

## 2021-06-05 ENCOUNTER — Encounter: Payer: Self-pay | Admitting: Physician Assistant

## 2021-06-05 ENCOUNTER — Other Ambulatory Visit: Payer: Self-pay

## 2021-06-05 ENCOUNTER — Ambulatory Visit: Payer: Medicare HMO | Admitting: Physician Assistant

## 2021-06-05 VITALS — BP 129/67 | HR 56 | Resp 18 | Ht 65.0 in | Wt 175.0 lb

## 2021-06-05 DIAGNOSIS — G3184 Mild cognitive impairment, so stated: Secondary | ICD-10-CM

## 2021-06-05 MED ORDER — DONEPEZIL HCL 10 MG PO TABS: ORAL_TABLET | ORAL | 11 refills | Status: DC

## 2021-06-05 NOTE — Progress Notes (Signed)
Assessment/Plan:   Mild neurocognitive disorder with behavioral disturbance   Recommendations:  Discussed safety both in and out of the home.  Discussed the importance of regular daily schedule with inclusion of crossword puzzles to maintain brain function.  Continue to monitor mood by PCP Stay active at least 30 minutes at least 3 times a week.  Naps should be scheduled and should be no longer than 60 minutes and should not occur after 2 PM.  Mediterranean diet is recommended  Start Donepezil 10 mg :Take half tablet (5 mg) daily for 2 weeks, then increase to the full tablet at 10 mg daily. Side effects discussed   Follow up in 6  months.   Case discussed with Dr. Tomi Likens who agrees with the plan     Subjective:    Diana Smith is a very pleasant 75 y.o. RH female with a history of anxiety, depression, seen today in follow up for memory loss. This patient is accompanied in the office by her who supplements the history.  Previous records as well as any outside records available were reviewed prior to todays visit.  Patient was last seen at our office on 01/08/2021 at which time her MoCA was 17/30.  She had a neuropsych evaluation deeming mild neurocognitive disorder, unclear the etiology.  She feels that since her last visit, the memory difficulties are about the same.  She continues to repeat some of her stories or asked the same questions.  She continues to have anxiety as well.  At night, she has vivid dreams, and she may have REM behavior, waking up i her husband as she is screaming, without significant changes from prior.  She may have had 1 episode of sleepwalking, found in the kitchen eating cookies, and she has no recall of it.  She states that sometimes she sees what it sounds like the silhouette in the corner of her right eye, which quickly goes away, but is not watering her at this time.  She is independent of bathing and dressing.  Appetite is good denies trouble swallowing.   She denies any headaches, dizziness, vision changes, focal numbness or tingling, weakness, bowel or bladder dysfunction, anosmia or tremors.  She denies paranoia.  She is to undergo left knee surgery in April of next year.    INITIAL HISTORY OF PRESENT ILLNESS 01/08/21 : This is a 75 year old left-handed woman with a history of anxiety,depression, presenting for evaluation of memory loss. She is not sure of her age and states she is 25. She gets emotional speaking about her memory. Her husband started noticing changes around 2 years ago, "she was just so upset I thought she was bipolar." She would get very upset then calm down. She was started on Paxil which helped, if she does not take it she gets really upset. He notices she repeats the same question 4-5 times.  She denies getting lost driving but has to concentrate where she is going, realizing she had to turn previously. She used to manage finances without difficulties, her husband took over 4-5 years ago because she could not do Anim[employer]. She rarely misses her medications. She is tearful and notes waking up feeling depressed. She takes her medication and starts doing things around the house and starts feeling better. There are several family issues, they have taken care of their 2 grandchildren since 2014. Her son does not have a good relationship with them which bothers her. She gets choked up when he relates  that her 53 year old mother calls her daily that the mother feels depressed, "she worries herself to death about her mother," who only wants [name] to stay with her. This has been ongoing for the past 2 years. She gets nervous worrying about her mother and grandchildren. They are on a weird schedule with their grandchildren, she gets 8 hours of sleep but does not feel rested. Her husband notes she talks in her sleep, sometimes screams out, opens her eyes and looks at him, then a few minutes later talks again "like in a zone." These only occur at  night, around three times a week. She acts out her dreams. No hallucinations. She states she worries about everything. She always had "a taste of anxiety when younger," stating it just seems so complicated and she is so sorry it is going on.    She denies any headaches, dizziness, vision changes, focal numbness/tingling/weakness, bowel/bladder dysfunction, anosmia, or tremors. She has right shoulder pain. She denies any falls but is more careful walking up steps. Her father had memory issues, her maternal grandmother "was a little crazy." No history of significant head injuries or alcohol use.      Neuropsych testing 06/01/21  Dr. Melvyn Novas Briefly, results suggested primary impairments surrounding executive functioning, as well as both encoding (i.e., learning) and retrieval aspects of verbal memory. Further performance variability was exhibited across verbal fluency, with semantic fluency exhibiting greater impairment relative to phonemic fluency. The etiology for cognitive decline is unclear. Despite verbal memory impairment, she responded reasonably well when provided with cues. This does not suggest strong evidence for a memory storage deficit at the present time. Rather, there is significant difficulty learning and later accessing learned information upon demand. The lack of a clearly evident storage deficit, coupled with appropriate performances across confrontation naming and visuospatial abilities is inconsistent with typically presenting Alzheimer's disease. However, I certainly cannot rule out this disease process. If Alzheimer's disease is indeed present, it would seem to be in early stages.   PREVIOUS MEDICATIONS:   CURRENT MEDICATIONS:  Outpatient Encounter Medications as of 06/05/2021  Medication Sig   ALPRAZolam (XANAX) 0.25 MG tablet Take 0.25 mg by mouth See admin instructions. Take 1/2 tablet by mouth daily if needed take 1 tablet as needed for anxiety   Apoaequorin (PREVAGEN) 10 MG CAPS  Take 50 mcg by mouth.   Biotin 1000 MCG CHEW Chew by mouth.   Biotin 1000 MCG tablet 1 tablet   busPIRone (BUSPAR) 15 MG tablet Take 1 tablet by mouth 3 (three) times daily.   busPIRone (BUSPAR) 5 MG tablet Take 5 mg by mouth 3 (three) times daily.   calcium carbonate (OSCAL) 1500 (600 Ca) MG TABS tablet Take by mouth 2 (two) times daily with a meal.   doxylamine, Sleep, (SLEEP AID) 25 MG tablet 1 tablet at bedtime as needed   folic acid (FOLVITE) 071 MCG tablet Take 400 mcg by mouth daily.   Multiple Vitamins-Minerals (PRESERVISION AREDS PO) Take by mouth.   omeprazole (PRILOSEC) 20 MG capsule Take 20 mg by mouth daily.   PARoxetine (PAXIL) 30 MG tablet Take 30 mg by mouth every morning.   tiZANidine (ZANAFLEX) 4 MG tablet Take 1 tablet (4 mg total) by mouth every 6 (six) hours as needed for muscle spasms.   vitamin B-12 (CYANOCOBALAMIN) 500 MCG tablet Take 500 mcg by mouth daily.   vitamin B-12 (CYANOCOBALAMIN) 500 MCG tablet 1 tablet   No facility-administered encounter medications on file as of 06/05/2021.  Objective:     PHYSICAL EXAMINATION:    VITALS:   Vitals:   06/05/21 1259  BP: 129/67  Pulse: (!) 56  Resp: 18  SpO2: 98%  Weight: 175 lb (79.4 kg)  Height: 5' 5"  (1.651 m)    GEN:  The patient appears stated age and is in NAD. HEENT:  Normocephalic, atraumatic.   Neurological examination:  General: NAD, well-groomed, appears stated age. Orientation: The patient is alert. Oriented to person, place and date Cranial nerves: There is good facial symmetry.The speech is fluent and clear. No aphasia or dysarthria. Fund of knowledge is appropriate. Recent and remote memory are impaired. Attention and concentration are reduced.  Able to name objects and repeat phrases.  Hearing is intact to conversational tone.    Sensation: Sensation is intact to light touch throughout Motor: Strength is at least antigravity x4. Tremors: none  DTR's 2/4 in [place]  Cognitive Assessment  01/08/2021  Visuospatial/ Executive (0/5) 4  Naming (0/3) 2  Attention: Read list of digits (0/2) 1  Attention: Read list of letters (0/1) 0  Attention: Serial 7 subtraction starting at 100 (0/3) 1  Language: Repeat phrase (0/2) 0  Language : Fluency (0/1) 0  Abstraction (0/2) 2  Delayed Recall (0/5) 0  Orientation (0/6) 6  Total 16  Adjusted Score (based on education) 17   No flowsheet data found.  No flowsheet data found.     Movement examination: Tone: There is normal tone in the UE/LE Abnormal movements:  no tremor.  No myoclonus.  No asterixis.   Coordination:  There is no decremation with RAM's. Normal finger to nose  Gait and Station: The patient has no difficulty arising out of a deep-seated chair without the use of the hands. The patient's stride length is good.  Gait is cautious and narrow.        Total time spent on today's visit was 50  minutes, including both face-to-face time and nonface-to-face time. Time included that spent on review of records (prior notes available to me/labs/imaging if pertinent), discussing treatment and goals, answering patient's questions and coordinating care.  Cc:  Jonathon Jordan, MD Sharene Butters, PA-C

## 2021-06-05 NOTE — Patient Instructions (Addendum)
It was a pleasure to see you today at our office.   Recommendations:  Follow up in  6 months We will start donepezil half tablet (62m) daily for 2  weeks.  If you are tolerating the medication, then after 2 weeks, we will increase the dose to a full tablet of 10 mg daily.  Side effects include nausea, vomiting, diarrhea, vivid dreams, and muscle cramps.  Please call the clinic if you experience any of these symptoms.    RECOMMENDATIONS FOR ALL PATIENTS WITH MEMORY PROBLEMS: 1. Continue to exercise (Recommend 30 minutes of walking everyday, or 3 hours every week) 2. Increase social interactions - continue going [place] enjoy social gatherings with friends and family 3. Eat healthy, avoid fried foods and eat more fruits and vegetables 4. Maintain adequate blood pressure, blood sugar, and blood cholesterol level. Reducing the risk of stroke and cardiovascular disease also helps promoting better memory. 5. Avoid stressful situations. Live a simple life and avoid aggravations. Organize your time and prepare for the next day in anticipation. 6. Sleep well, avoid any interruptions of sleep and avoid any distractions in the bedroom that may interfere with adequate sleep quality 7. Avoid sugar, avoid sweets as there is a strong link between excessive sugar intake, diabetes, and cognitive impairment We discussed the Mediterranean diet, which has been shown to help patients reduce the risk of progressive memory disorders and reduces cardiovascular risk. This includes eating fish, eat fruits and green leafy vegetables, nuts like almonds and hazelnuts, walnuts, and also use olive oil. Avoid fast foods and fried foods as much as possible. Avoid sweets and sugar as sugar use has been linked to worsening of memory function.  There is always a concern of gradual progression of memory problems. If this is the case, then we may need to adjust level of care according to patient needs. Support, both to the  patient and caregiver, should then be put into place.    FALL PRECAUTIONS: Be cautious when walking. Scan the area for obstacles that may increase the risk of trips and falls. When getting up in the mornings, sit up at the edge of the bed for a few minutes before getting out of bed. Consider elevating the bed at the head end to avoid drop of blood pressure when getting up. Walk always in a well-lit room (use night lights in the walls). Avoid area rugs or power cords from appliances in the middle of the walkways. Use a walker or a cane if necessary and consider physical therapy for balance exercise. Get your eyesight checked regularly.  FINANCIAL OVERSIGHT: Supervision, especially oversight when making financial decisions or transactions is also recommended.  HOME SAFETY: Consider the safety of the kitchen when operating appliances like stoves, microwave oven, and blender. Consider having supervision and share cooking responsibilities until no longer able to participate in those. Accidents with firearms and other hazards in the house should be identified and addressed as well.   ABILITY TO BE LEFT ALONE: If patient is unable to contact 911 operator, consider using LifeLine, or when the need is there, arrange for someone to stay with patients. Smoking is a fire hazard, consider supervision or cessation. Risk of wandering should be assessed by caregiver and if detected at any point, supervision and safe proof recommendations should be instituted.  MEDICATION SUPERVISION: Inability to self-administer medication needs to be constantly addressed. Implement a mechanism to ensure safe administration of the medications.

## 2021-06-09 DIAGNOSIS — R69 Illness, unspecified: Secondary | ICD-10-CM | POA: Diagnosis not present

## 2021-06-09 DIAGNOSIS — I679 Cerebrovascular disease, unspecified: Secondary | ICD-10-CM | POA: Diagnosis not present

## 2021-06-09 DIAGNOSIS — E2839 Other primary ovarian failure: Secondary | ICD-10-CM | POA: Diagnosis not present

## 2021-06-10 DIAGNOSIS — H5210 Myopia, unspecified eye: Secondary | ICD-10-CM | POA: Diagnosis not present

## 2021-06-11 ENCOUNTER — Other Ambulatory Visit: Payer: Self-pay | Admitting: Family Medicine

## 2021-06-11 DIAGNOSIS — E2839 Other primary ovarian failure: Secondary | ICD-10-CM

## 2021-07-01 DIAGNOSIS — R69 Illness, unspecified: Secondary | ICD-10-CM | POA: Diagnosis not present

## 2021-07-01 DIAGNOSIS — F0390 Unspecified dementia without behavioral disturbance: Secondary | ICD-10-CM | POA: Diagnosis not present

## 2021-07-07 ENCOUNTER — Ambulatory Visit
Admission: RE | Admit: 2021-07-07 | Discharge: 2021-07-07 | Disposition: A | Discharge status: Home / Self Care | Payer: Medicare HMO | Source: Ambulatory Visit | Attending: Family Medicine | Admitting: Family Medicine

## 2021-07-07 DIAGNOSIS — Z1231 Encounter for screening mammogram for malignant neoplasm of breast: Secondary | ICD-10-CM

## 2021-07-10 DIAGNOSIS — Z20822 Contact with and (suspected) exposure to covid-19: Secondary | ICD-10-CM | POA: Diagnosis not present

## 2021-08-11 DIAGNOSIS — F331 Major depressive disorder, recurrent, moderate: Secondary | ICD-10-CM | POA: Diagnosis not present

## 2021-08-11 DIAGNOSIS — I679 Cerebrovascular disease, unspecified: Secondary | ICD-10-CM | POA: Diagnosis not present

## 2021-08-11 DIAGNOSIS — R69 Illness, unspecified: Secondary | ICD-10-CM | POA: Diagnosis not present

## 2021-08-11 DIAGNOSIS — F0390 Unspecified dementia without behavioral disturbance: Secondary | ICD-10-CM | POA: Diagnosis not present

## 2021-08-11 DIAGNOSIS — F419 Anxiety disorder, unspecified: Secondary | ICD-10-CM | POA: Diagnosis not present

## 2021-08-12 DIAGNOSIS — Z86010 Personal history of colonic polyps: Secondary | ICD-10-CM | POA: Diagnosis not present

## 2021-08-24 ENCOUNTER — Other Ambulatory Visit: Payer: Self-pay

## 2021-08-24 ENCOUNTER — Encounter: Payer: Self-pay | Admitting: Neurology

## 2021-08-24 ENCOUNTER — Ambulatory Visit: Payer: Medicare HMO | Admitting: Neurology

## 2021-08-24 VITALS — BP 146/68 | HR 63 | Ht 65.0 in | Wt 178.4 lb

## 2021-08-24 DIAGNOSIS — G3184 Mild cognitive impairment, so stated: Secondary | ICD-10-CM

## 2021-08-24 MED ORDER — DONEPEZIL HCL 10 MG PO TABS: ORAL_TABLET | ORAL | 3 refills | Status: DC

## 2021-08-24 NOTE — Patient Instructions (Signed)
Good to see you.  Continue Donepezil 21m daily  2. Stop the Diphenhydramine (this is Benadryl). Start taking Melatonin 338mor 104m89mvery evening to help with sleep and movements in sleep  3. Follow-up with SarSharene Butters scheduled in June, call for any changes   FALL PRECAUTIONS: Be cautious when walking. Scan the area for obstacles that may increase the risk of trips and falls. When getting up in the mornings, sit up at the edge of the bed for a few minutes before getting out of bed. Consider elevating the bed at the head end to avoid drop of blood pressure when getting up. Walk always in a well-lit room (use night lights in the walls). Avoid area rugs or power cords from appliances in the middle of the walkways. Use a walker or a cane if necessary and consider physical therapy for balance exercise. Get your eyesight checked regularly.  FINANCIAL OVERSIGHT: Supervision, especially oversight when making financial decisions or transactions is also recommended.  HOME SAFETY: Consider the safety of the kitchen when operating appliances like stoves, microwave oven, and blender. Consider having supervision and share cooking responsibilities until no longer able to participate in those. Accidents with firearms and other hazards in the house should be identified and addressed as well.  DRIVING: Regarding driving, in patients with progressive memory problems, driving will be impaired. We advise to have someone else do the driving if trouble finding directions or if minor accidents are reported. Independent driving assessment is available to determine safety of driving.  ABILITY TO BE LEFT ALONE: If patient is unable to contact 911 operator, consider using LifeLine, or when the need is there, arrange for someone to stay with patients. Smoking is a fire hazard, consider supervision or cessation. Risk of wandering should be assessed by caregiver and if detected at any point, supervision and safe proof  recommendations should be instituted.  MEDICATION SUPERVISION: Inability to self-administer medication needs to be constantly addressed. Implement a mechanism to ensure safe administration of the medications.  RECOMMENDATIONS FOR ALL PATIENTS WITH MEMORY PROBLEMS: 1. Continue to exercise (Recommend 30 minutes of walking everyday, or 3 hours every week) 2. Increase social interactions - continue going to [place] enjoy social gatherings with friends and family 3. Eat healthy, avoid fried foods and eat more fruits and vegetables 4. Maintain adequate blood pressure, blood sugar, and blood cholesterol level. Reducing the risk of stroke and cardiovascular disease also helps promoting better memory. 5. Avoid stressful situations. Live a simple life and avoid aggravations. Organize your time and prepare for the next day in anticipation. 6. Sleep well, avoid any interruptions of sleep and avoid any distractions in the bedroom that may interfere with adequate sleep quality 7. Avoid sugar, avoid sweets as there is a strong link between excessive sugar intake, diabetes, and cognitive impairment We discussed the Mediterranean diet, which has been shown to help patients reduce the risk of progressive memory disorders and reduces cardiovascular risk. This includes eating fish, eat fruits and green leafy vegetables, nuts like almonds and hazelnuts, walnuts, and also use olive oil. Avoid fast foods and fried foods as much as possible. Avoid sweets and sugar as sugar use has been linked to worsening of memory function.  There is always a concern of gradual progression of memory problems. If this is the case, then we may need to adjust level of care according to patient needs. Support, both to the patient and caregiver, should then be put into place.  Mediterranean Diet  Why follow it? Research shows Those who follow the Mediterranean diet have a reduced risk of heart disease  The diet is associated with  a reduced incidence of Parkinson's and Alzheimer's diseases People following the diet may have longer life expectancies and lower rates of chronic diseases  The Dietary Guidelines for Americans recommends the Mediterranean diet as an eating plan to promote health and prevent disease  What Is the Mediterranean Diet?  Healthy eating plan based on typical foods and recipes of Mediterranean-style cooking The diet is primarily a plant based diet; these foods should make up a majority of meals   Starches - Plant based foods should make up a majority of meals - They are an important sources of vitamins, minerals, energy, antioxidants, and fiber - Choose whole grains, foods high in fiber and minimally processed items  - Typical grain sources include wheat, oats, barley, corn, brown rice, bulgar, farro, millet, polenta, couscous  - Various types of beans include chickpeas, lentils, fava beans, black beans, white beans   Fruits  Veggies - Large quantities of antioxidant rich fruits & veggies; 6 or more servings  - Vegetables can be eaten raw or lightly drizzled with oil and cooked  - Vegetables common to the traditional Mediterranean Diet include: artichokes, arugula, beets, broccoli, brussel sprouts, cabbage, carrots, celery, collard greens, cucumbers, eggplant, kale, leeks, lemons, lettuce, mushrooms, okra, onions, peas, peppers, potatoes, pumpkin, radishes, rutabaga, shallots, spinach, sweet potatoes, turnips, zucchini - Fruits common to the Mediterranean Diet include: apples, apricots, avocados, cherries, clementines, dates, figs, grapefruits, grapes, melons, nectarines, oranges, peaches, pears, pomegranates, strawberries, tangerines  Fats - Replace butter and margarine with healthy oils, such as olive oil, canola oil, and tahini  - Limit nuts to no more than a handful a day  - Nuts include walnuts, almonds, pecans, pistachios, pine nuts  - Limit or avoid candied, honey roasted or heavily salted  nuts - Olives are central to the [employer] - can be eaten whole or used in a variety of dishes   Meats Protein - Limiting red meat: no more than a few times a month - When eating red meat: choose lean cuts and keep the portion to the size of deck of cards - Eggs: approx. 0 to 4 times a week  - Fish and lean poultry: at least 2 a week  - Healthy protein sources include, chicken, [place], lean beef, lamb - Increase intake of seafood such as tuna, salmon, trout, mackerel, shrimp, scallops - Avoid or limit high fat processed meats such as sausage and bacon  Dairy - Include moderate amounts of low fat dairy products  - Focus on healthy dairy such as fat free yogurt, skim milk, low or reduced fat cheese - Limit dairy products higher in fat such as whole or 2% milk, cheese, ice cream  Alcohol - Moderate amounts of red wine is ok  - No more than 5 oz daily for women (all ages) and men older than age 54  - No more than 10 oz of wine daily for men younger than 56  Other - Limit sweets and other desserts  - Use herbs and spices instead of salt to flavor foods  - Herbs and spices common to the traditional Mediterranean Diet include: basil, bay leaves, chives, cloves, cumin, fennel, garlic, lavender, marjoram, mint, oregano, parsley, pepper, rosemary, sage, savory, sumac, tarragon, thyme   Its not just a diet, its a lifestyle:  The Mediterranean diet includes lifestyle factors typical  of those in the region  Foods, drinks and meals are best eaten with others and savored Daily physical activity is important for overall good health This could be strenuous exercise like running and aerobics This could also be more leisurely activities such as walking, housework, yard-work, or taking the stairs Moderation is the key; a balanced and healthy diet accommodates most foods and drinks Consider portion sizes and frequency of consumption of certain foods   Meal Ideas & Options:  Breakfast:  Whole  wheat toast or whole wheat English muffins with peanut butter & hard boiled egg Steel cut oats topped with apples & cinnamon and skim milk  Fresh fruit: banana, strawberries, melon, berries, peaches  Smoothies: strawberries, bananas, greek yogurt, peanut butter Low fat greek yogurt with blueberries and granola  Egg white omelet with spinach and mushrooms Breakfast couscous: whole wheat couscous, apricots, skim milk, cranberries  Sandwiches:  Hummus and grilled vegetables (peppers, zucchini, squash) on whole wheat bread   Grilled chicken on whole wheat pita with lettuce, tomatoes, cucumbers or tzatziki  [place] salad on whole wheat bread: tuna salad made with greek yogurt, olives, red peppers, capers, green onions Garlic rosemary lamb pita: lamb sauted with garlic, rosemary, salt & pepper; add lettuce, cucumber, greek yogurt to pita - flavor with lemon juice and black pepper  Seafood:  Mediterranean grilled salmon, seasoned with garlic, basil, parsley, lemon juice and black pepper Shrimp, lemon, and spinach whole-grain pasta salad made with low fat greek yogurt  Seared scallops with lemon orzo  Seared tuna steaks seasoned salt, pepper, coriander topped with tomato mixture of olives, tomatoes, olive oil, minced garlic, parsley, green onions and cappers  Meats:  Herbed greek chicken salad with kalamata olives, cucumber, feta  Red bell peppers stuffed with spinach, bulgur, lean ground beef (or lentils) & topped with feta   Kebabs: skewers of chicken, tomatoes, onions, zucchini, squash  [place] burgers: made with red onions, mint, dill, lemon juice, feta cheese topped with roasted red peppers Vegetarian Cucumber salad: cucumbers, artichoke hearts, celery, red onion, feta cheese, tossed in olive oil & lemon juice  Hummus and whole grain pita points with a greek salad (lettuce, tomato, feta, olives, cucumbers, red onion) Lentil soup with celery, carrots made with vegetable broth, garlic, salt and  pepper  Tabouli salad: parsley, bulgur, mint, scallions, cucumbers, tomato, radishes, lemon juice, olive oil, salt and pepper.

## 2021-08-24 NOTE — Progress Notes (Signed)
NEUROLOGY FOLLOW UP OFFICE NOTE  Diana Smith [id number] 04-14-1946  HISTORY OF PRESENT ILLNESS: I had the pleasure of seeing Diana Smith in follow-up in the neurology clinic on 08/24/2021. She is again accompanied by her husband who helps supplement the history today. The patient was last seen 2 months ago by Memory Disorders PA Sharene Butters to discuss Neuropsychological evaluation results from 04/2021 which indicated Mild Neurocognitive Disorder,etiology unclear. Findings did not suggest strong evidence for a memory storage deficit, rather there was significant difficulty learning and later accessing learned information. Findings were inconsistent with AD, although logopenic subtype PPA remained on the differential, but unlikely at present time. Vascular etiology may be consistent with her testing, however neuroimaging was not consistent. No compelling evidence for LBD or bvFTD. Mild depression and anxiety were not felt to be solely responsible for cognitive changes, but likely exacerbating underlying dysfunction. She was started on Donepezil 63m daily, she initially had sleep difficulties taking it at bedtime, this has improved by taking it in the daytime. She still reports sleep problems and has been taking 1-2 tablets of OTC diphenhydramine which helps her sleep. She has not been taking npas.  Her husband reports she repeats the same question every 10 minutes. She continues to drive in the daytime and denies getting lost, but she has to think ahead, it makes her nervous. Her husband manages finances. She manages her own medications and takes them religiously. She cooks a little and denies leaving stove on. Mood feels better, she is upset and frustrated with recent gum infection due to dry mouth, causing her knee surgery to be postponed until dental issues are addressed. Her husband denies any personality changes. Sometimes she is sitting and thinking someone is coming from the corner of her  eye, then she turns and no one is there. Her husband notes that she sometimes acts out her dreams, talks/screams in her sleep. No falls.    History on Initial Assessment 01/08/2021: This is a 76year old left-handed woman with a history of anxiety,depression, presenting for evaluation of memory loss. She is not sure of her age and states she is 76 She gets emotional speaking about her memory. Her husband started noticing changes around 2 years ago, "she was just so upset I thought she was bipolar." She would get very upset then calm down. She was started on Paxil which helped, if she does not take it she gets really upset. He notices she repeats the same question 4-5 times. She denies getting lost driving but has to concentrate where she is going, realizing she had to turn previously. She used to manage finances without difficulties, her husband took over 4-5 years ago because she could not do [employer] She rarely misses her medications. She is tearful and notes waking up feeling depressed. She takes her medication and starts doing things around the house and starts feeling better. There are several family issues, they have taken care of their 2 grandchildren since 2014. Her son does not have a good relationship with them which bothers her. She gets choked up when he relates that her 953year old mother calls her daily that the mother feels depressed, "she worries herself to death about her mother," who only wants [name] to stay with her. This has been ongoing for the past 2 years. She gets nervous worrying about her mother and grandchildren. They are on a weird schedule with their grandchildren, she gets 8 hours of sleep but does not feel  rested. Her husband notes she talks in her sleep, sometimes screams out, opens her eyes and looks at him, then a few minutes later talks again "like in a zone." These only occur at night, around three times a week. She acts out her dreams. No hallucinations. She states she  worries about everything. She always had "a taste of anxiety when younger," stating it just seems so complicated and she is so sorry it is going on.   She denies any headaches, dizziness, vision changes, focal numbness/tingling/weakness, bowel/bladder dysfunction, anosmia, or tremors. She has right shoulder pain. She denies any falls but is more careful walking up steps. Her father had memory issues, her maternal grandmother "was a little crazy." No history of significant head injuries or alcohol use.   Diagnostic Data: Brain MRI without contrast done 12/2020 no acute changes, mild to moderate atrophy and mild chronic microvascular disease.  Neuropsychological evaluation done 04/2021 indicated Mild Neurocognitive Disorder,etiology unclear. Findings did not suggest strong evidence for a memory storage deficit, rather there was significant difficulty learning and later accessing learned information. Findings were inconsistent with AD, although logopenic subtype PPA remained on the differential, but unlikely at present time. Vascular etiology may be consistent with her testing, however neuroimaging was not consistent. No compelling evidence for LBD or bvFTD. Mild depression and anxiety were not felt to be solely responsible for cognitive changes, but likely exacerbating underlying dysfunction   PAST MEDICAL HISTORY: Past Medical History:  Diagnosis Date   Actinic keratosis    Allergic rhinitis due to pollen    Cerebrovascular disease    Frequent urinary tract infections    Gastro-esophageal reflux disease without esophagitis    Generalized anxiety disorder    Heartburn    Hematochezia    History of COVID-19    Hyperglycemia    Hyperlipidemia    Major depressive disorder    Mild neurocognitive disorder, unclear etiology 93/57/0177   Nonalcoholic steatohepatitis (NASH)    Osteoarthritis of right knee 01/25/2021   Overactive bladder    Personal history of colonic polyps    Prediabetes    Pure  hypercholesterolemia    Right bundle branch block    Rosacea    Sciatica, right side    Slow transit constipation    Vitamin B12 deficiency (non anemic)    Vitamin D deficiency     MEDICATIONS: Current Outpatient Medications on File Prior to Visit  Medication Sig Dispense Refill   ALPRAZolam (XANAX) 0.25 MG tablet Take 0.25 mg by mouth See admin instructions. Take 1/2 tablet by mouth daily if needed take 1 tablet as needed for anxiety     Apoaequorin (PREVAGEN) 10 MG CAPS Take 50 mcg by mouth.     Biotin 1000 MCG CHEW Chew by mouth.     Biotin 1000 MCG tablet 1 tablet     busPIRone (BUSPAR) 15 MG tablet Take 1 tablet by mouth 3 (three) times daily.     busPIRone (BUSPAR) 5 MG tablet Take 5 mg by mouth 3 (three) times daily.     calcium carbonate (OSCAL) 1500 (600 Ca) MG TABS tablet Take by mouth 2 (two) times daily with a meal.     donepezil (ARICEPT) 10 MG tablet Take half tablet (5 mg) daily for 2 weeks, then increase to the full tablet at 10 mg daily 30 tablet 11   doxylamine, Sleep, (SLEEP AID) 25 MG tablet 1 tablet at bedtime as needed     folic acid (FOLVITE) 939 MCG tablet Take 400  mcg by mouth daily.     Multiple Vitamins-Minerals (PRESERVISION AREDS PO) Take by mouth.     omeprazole (PRILOSEC) 20 MG capsule Take 20 mg by mouth daily.     PARoxetine (PAXIL) 30 MG tablet Take 30 mg by mouth every morning.     tiZANidine (ZANAFLEX) 4 MG tablet Take 1 tablet (4 mg total) by mouth every 6 (six) hours as needed for muscle spasms. 30 tablet 0   vitamin B-12 (CYANOCOBALAMIN) 500 MCG tablet Take 500 mcg by mouth daily.     vitamin B-12 (CYANOCOBALAMIN) 500 MCG tablet 1 tablet     No current facility-administered medications on file prior to visit.    ALLERGIES: Allergies  Allergen Reactions   Dynacin [Minocycline Hcl]    Lorabid [Loracarbef]    Minocycline Other (See Comments)    FAMILY HISTORY: Family History  Problem Relation Age of Onset   Dementia Father    Memory loss  Father    Breast cancer Neg Hx     SOCIAL HISTORY: Social History   Socioeconomic History   Marital status: Married    Spouse name: Not on file   Number of children: Not on file   Years of education: 12   Highest education level: High school graduate  Occupational History   Occupation: Retired    Comment: Psychiatric nurse  Tobacco Use   Smoking status: Never   Smokeless tobacco: Never  Substance and Sexual Activity   Alcohol use: Never   Drug use: Never   Sexual activity: Not on file  Other Topics Concern   Not on file  Social History Narrative   Not on file   Social Determinants of Health   Financial Resource Strain: Not on file  Food Insecurity: Not on file  Transportation Needs: Not on file  Physical Activity: Not on file  Stress: Not on file  Social Connections: Not on file  Intimate Partner Violence: Not on file     PHYSICAL EXAM: Vitals:   08/24/21 0953  BP: (!) 146/68  Pulse: 63  SpO2: 97%   General: No acute distress, anxious Head:  Normocephalic/atraumatic Skin/Extremities: No rash, no edema Neurological Exam: alert and awake. No aphasia or dysarthria. Fund of knowledge is appropriate. Attention and concentration are normal.   Cranial nerves: Pupils equal, round. Extraocular movements intact.  No facial asymmetry.  Motor: moves all extremities symmetrically, at least anti-gravity. Gait narrow-based and steady, no ataxia.   IMPRESSION: This is a 76 yo LH woman with a history of anxiety,depression, with Neuropsychological evaluation indicating Mild Neurocognitive Disorder,etiology unclear. There was significant difficulty learning and later accessing learned information, but not a clear memory storage deficit that is typically seen with AD. No compelling evidence of LBD. Today she reports possibly seeing things at the corner of her vision, as well as REM behavior disorder. Continue to monitor. Continue Donepezil 71m daily. She was advised to stop  diphenhydramine taken for sleep as anticholinergics can worsen cognition. She can try melatonin which can also help with RBD. We discussed the importance of control of vascular risk factors, physical exercise, brain stimulation exercises, and MIND diet for overall brain health. Safety precautions discussed, monitor driving. Follow-up as scheduled with Memory Disorders PA SSharene Buttersin June, call for any changes.   Thank you for allowing me to participate in her care.  Please do not hesitate to call for any questions or concerns.    KEllouise Newer M.D.   CC: Dr. WStephanie Acre

## 2021-08-29 ENCOUNTER — Other Ambulatory Visit: Payer: Self-pay

## 2021-08-29 ENCOUNTER — Ambulatory Visit
Admission: EM | Admit: 2021-08-29 | Discharge: 2021-08-29 | Disposition: A | Discharge status: Home / Self Care | Payer: Medicare HMO | Attending: Family Medicine | Admitting: Family Medicine

## 2021-08-29 DIAGNOSIS — R109 Unspecified abdominal pain: Secondary | ICD-10-CM

## 2021-08-29 LAB — POCT URINALYSIS DIP (MANUAL ENTRY)
Bilirubin, UA: NEGATIVE
Blood, UA: NEGATIVE
Glucose, UA: NEGATIVE mg/dL
Nitrite, UA: NEGATIVE
Spec Grav, UA: 1.015 (ref 1.010–1.025)
Urobilinogen, UA: 0.2 E.U./dL
pH, UA: 7 (ref 5.0–8.0)

## 2021-08-29 MED ORDER — SULFAMETHOXAZOLE-TRIMETHOPRIM 800-160 MG PO TABS: 1.0000 | ORAL_TABLET | Freq: Two times a day (BID) | ORAL | 0 refills | Status: AC

## 2021-08-29 NOTE — ED Triage Notes (Signed)
Pt reports lower back pain x 2-3 days. States pain improved when lay down. Pt think she may have an UTI.  ?

## 2021-08-29 NOTE — ED Provider Notes (Signed)
?[facility] ? ? ? ?CSN: [id number] ?Arrival date & time: 08/29/21  1453 ? ? ?  ? ?History   ?Chief Complaint ?Chief Complaint  ?Patient presents with  ? Back Pain  ? ? ?HPI ?Diana Smith is a 76 y.o. female.  ? ?76 year old female presents today with approximately 3 days of lower back, flank pain, nausea, urinary retention.  The pain waxes and wanes.  Feels better when she lies down. ?Denies any fever, chills.  History of recurrent urinary tract infections ? ? ?Back Pain ? ?Past Medical History:  ?Diagnosis Date  ? Actinic keratosis   ? Allergic rhinitis due to pollen   ? Cerebrovascular disease   ? Frequent urinary tract infections   ? Gastro-esophageal reflux disease without esophagitis   ? Generalized anxiety disorder   ? Heartburn   ? Hematochezia   ? History of COVID-19   ? Hyperglycemia   ? Hyperlipidemia   ? Major depressive disorder   ? Mild neurocognitive disorder, unclear etiology 05/25/2021  ? Nonalcoholic steatohepatitis (NASH)   ? Osteoarthritis of right knee 01/25/2021  ? Overactive bladder   ? Personal history of colonic polyps   ? Prediabetes   ? Pure hypercholesterolemia   ? Right bundle branch block   ? Rosacea   ? Sciatica, right side   ? Slow transit constipation   ? Vitamin B12 deficiency (non anemic)   ? Vitamin D deficiency   ? ? ?Patient Active Problem List  ? Diagnosis Date Noted  ? Mild neurocognitive disorder, unclear etiology 05/25/2021  ? Actinic keratosis   ? Allergic rhinitis due to pollen   ? Cerebrovascular disease   ? History of COVID-19   ? Gastro-esophageal reflux disease without esophagitis   ? Generalized anxiety disorder   ? Hyperglycemia   ? Hyperlipidemia   ? Major depressive disorder   ? Nonalcoholic steatohepatitis (NASH)   ? Overactive bladder   ? Personal history of colonic polyps   ? Prediabetes   ? Pure hypercholesterolemia   ? Right bundle branch block   ? Rosacea   ? Sciatica, right side   ? Slow transit constipation   ? Vitamin B12 deficiency (non  anemic)   ? Vitamin D deficiency   ? Osteoarthritis of right knee 01/25/2021  ? ? ?Past Surgical History:  ?Procedure Laterality Date  ? HYSTEROTOMY    ? MASS EXCISION  10/08/2011  ? Procedure: MINOR EXCISION OF MASS;  Surgeon: Cammie Sickle., MD;  Location: [facility];  Service: Orthopedics;  Laterality: Left;  excisional biopsy dorsum left long finger MCP joint  ? ? ?OB History   ?No obstetric history on file. ?  ? ? ? ?Home Medications   ? ?Prior to Admission medications   ?Medication Sig Start Date End Date Taking? Authorizing Provider  ?sulfamethoxazole-trimethoprim (BACTRIM DS) 800-160 MG tablet Take 1 tablet by mouth 2 (two) times daily for 7 days. 08/29/21 09/05/21 Yes Orvan July, NP  ?ALPRAZolam (XANAX) 0.25 MG tablet Take 0.25 mg by mouth See admin instructions. Take 1/2 tablet by mouth daily if needed take 1 tablet as needed for anxiety 12/23/20   [provider]  ?Biotin [place] by mouth.    [provider]  ?Biotin 1000 MCG tablet 1 tablet    [provider]  ?busPIRone (BUSPAR) 15 MG tablet Take 1 tablet by mouth 3 (three) times daily.    [provider]  ?calcium carbonate (OSCAL) 1500 (600  Ca) MG TABS tablet Take by mouth 2 (two) times daily with a meal. ?Patient not taking: Reported on 08/24/2021    [provider]  ?donepezil (ARICEPT) 10 MG tablet Take 1 tablet every morning 08/24/21   Cameron Sprang, MD  ?doxylamine, Sleep, (SLEEP AID) 25 MG tablet 1 tablet at bedtime as needed    [provider]  ?folic acid (FOLVITE) 629 MCG tablet Take 400 mcg by mouth daily.    [provider]  ?Multiple Vitamins-Minerals (PRESERVISION AREDS PO) Take by mouth.    [provider]  ?omeprazole (PRILOSEC) 20 MG capsule Take 20 mg by mouth daily.    [provider]  ?PARoxetine (PAXIL) 30 MG tablet Take 30 mg by mouth every morning.    [provider]  ?tiZANidine (ZANAFLEX) 4 MG tablet Take 1  tablet (4 mg total) by mouth every 6 (six) hours as needed for muscle spasms. 12/20/20   Faustino Congress, NP  ?vitamin B-12 (CYANOCOBALAMIN) 500 MCG tablet Take 500 mcg by mouth daily.    [provider]  ?vitamin B-12 (CYANOCOBALAMIN) 500 MCG tablet 1 tablet    [provider]  ? ? ?Family History ?Family History  ?Problem Relation Age of Onset  ? Dementia Father   ? Memory loss Father   ? Breast cancer Neg Hx   ? ? ?Social History ?Social History  ? ?Tobacco Use  ? Smoking status: Never  ? Smokeless tobacco: Never  ?Vaping Use  ? Vaping Use: Never used  ?Substance Use Topics  ? Alcohol use: Never  ? Drug use: Never  ? ? ? ?Allergies   ?Dynacin [minocycline hcl], Lorabid [loracarbef], and Minocycline ? ? ?Review of Systems ?Review of Systems  ?Musculoskeletal:  Positive for back pain.  ? ? ?Physical Exam ?Triage Vital Signs ?ED Triage Vitals  ?Enc Vitals Group  ?   BP 08/29/21 1505 115/70  ?   Pulse Rate 08/29/21 1505 72  ?   Resp 08/29/21 1505 17  ?   Temp 08/29/21 1505 97.8 ?F (36.6 ?C)  ?   Temp Source 08/29/21 1505 Oral  ?   SpO2 08/29/21 1505 96 %  ?   Weight --   ?   Height --   ?   Head Circumference --   ?   Peak Flow --   ?   Pain Score 08/29/21 1503 4  ?   Pain Loc --   ?   Pain Edu? --   ?   Excl. in [place]? --   ? ?No data found. ? ?Updated Vital Signs ?BP 115/70 (BP Location: Right Arm)   Pulse 72   Temp 97.8 ?F (36.6 ?C) (Oral)   Resp 17   SpO2 96%  ? ?Visual Acuity ?Right Eye Distance:   ?Left Eye Distance:   ?Bilateral Distance:   ? ?Right Eye Near:   ?Left Eye Near:    ?Bilateral Near:    ? ?Physical Exam ?Vitals and nursing note reviewed.  ?Constitutional:   ?   Appearance: Normal appearance.  ?HENT:  ?   Head: Normocephalic and atraumatic.  ?Pulmonary:  ?   Effort: Pulmonary effort is normal.  ?Abdominal:  ?   Tenderness: There is right CVA tenderness.  ?Skin: ?   General: Skin is warm and dry.  ?Neurological:  ?   Mental Status: She is alert.  ? ? ? ?UC Treatments / Results   ?Labs ?(all labs ordered are listed, but only abnormal results are  displayed) ?Labs Reviewed  ?POCT URINALYSIS DIP (MANUAL ENTRY) - Abnormal; Notable for the following components:  ?    Result Value  ? Clarity, UA hazy (*)   ? Ketones, POC UA trace (5) (*)   ? Protein Ur, POC trace (*)   ? Leukocytes, UA Moderate (2+) (*)   ? All other components within normal limits  ?URINE CULTURE  ? ? ?EKG ? ? ?Radiology ?No results found. ? ?Procedures ?Procedures (including critical care time) ? ?Medications Ordered in UC ?Medications - No data to display ? ?Initial Impression / Assessment and Plan / UC Course  ?I have reviewed the triage vital signs and the nursing notes. ? ?Pertinent labs & imaging results that were available during my care of the patient were reviewed by me and considered in my medical decision making (see chart for details). ? ?  ? ?Concern for kidney stone versus kidney infection based on symptoms and urinalysis. ?Treating with Bactrim.  Recommended if symptoms worsen to go to the ER.  Make sure you push fluids. ?Tylenol for pain as needed ?Final Clinical Impressions(s) / UC Diagnoses  ? ?Final diagnoses:  ?Flank pain  ? ? ? ?Discharge Instructions   ? ?  ?Treating you for possible urinary tract infection versus kidney infection.  You may also have a kidney stone.  If your symptoms worsen with more pain, nausea or vomiting will need to go to the ER.  You can take Tylenol for pain.  Make sure you are drinking plenty of fluids and staying hydrated ? ? ? ?ED Prescriptions   ? ? Medication Sig Dispense Auth. Provider  ? sulfamethoxazole-trimethoprim (BACTRIM DS) 800-160 MG tablet Take 1 tablet by mouth 2 (two) times daily for 7 days. 14 tablet Loura Halt A, NP  ? ?  ? ?PDMP not reviewed this encounter. ?  ?Orvan July, NP ?08/29/21 1530 ? ?

## 2021-08-29 NOTE — Discharge Instructions (Signed)
Treating you for possible urinary tract infection versus kidney infection.  You may also have a kidney stone.  If your symptoms worsen with more pain, nausea or vomiting will need to go to the ER.  You can take Tylenol for pain.  Make sure you are drinking plenty of fluids and staying hydrated ?

## 2021-08-30 LAB — URINE CULTURE: Culture: <10000 — AB

## 2021-09-15 DIAGNOSIS — I10 Essential (primary) hypertension: Secondary | ICD-10-CM | POA: Diagnosis not present

## 2021-09-22 ENCOUNTER — Other Ambulatory Visit (HOSPITAL_COMMUNITY): Payer: Medicare HMO

## 2021-09-22 DIAGNOSIS — K5730 Diverticulosis of large intestine without perforation or abscess without bleeding: Secondary | ICD-10-CM | POA: Diagnosis not present

## 2021-09-22 DIAGNOSIS — D123 Benign neoplasm of transverse colon: Secondary | ICD-10-CM | POA: Diagnosis not present

## 2021-09-22 DIAGNOSIS — D124 Benign neoplasm of descending colon: Secondary | ICD-10-CM | POA: Diagnosis not present

## 2021-09-22 DIAGNOSIS — K649 Unspecified hemorrhoids: Secondary | ICD-10-CM | POA: Diagnosis not present

## 2021-09-22 DIAGNOSIS — Z86010 Personal history of colonic polyps: Secondary | ICD-10-CM | POA: Diagnosis not present

## 2021-09-24 DIAGNOSIS — D124 Benign neoplasm of descending colon: Secondary | ICD-10-CM | POA: Diagnosis not present

## 2021-09-24 DIAGNOSIS — D123 Benign neoplasm of transverse colon: Secondary | ICD-10-CM | POA: Diagnosis not present

## 2021-10-05 ENCOUNTER — Ambulatory Visit: Admit: 2021-10-05 | Payer: Medicare HMO | Admitting: Orthopedic Surgery

## 2021-10-05 DIAGNOSIS — M1711 Unilateral primary osteoarthritis, right knee: Secondary | ICD-10-CM

## 2021-10-05 SURGERY — ARTHROPLASTY, KNEE, TOTAL
Anesthesia: Choice | Site: Knee | Laterality: Right

## 2021-12-02 ENCOUNTER — Ambulatory Visit
Admission: RE | Admit: 2021-12-02 | Discharge: 2021-12-02 | Disposition: A | Discharge status: Home / Self Care | Payer: Medicare HMO | Source: Ambulatory Visit | Attending: Family Medicine | Admitting: Family Medicine

## 2021-12-02 DIAGNOSIS — Z780 Asymptomatic menopausal state: Secondary | ICD-10-CM | POA: Diagnosis not present

## 2021-12-02 DIAGNOSIS — M8589 Other specified disorders of bone density and structure, multiple sites: Secondary | ICD-10-CM | POA: Diagnosis not present

## 2021-12-02 DIAGNOSIS — E2839 Other primary ovarian failure: Secondary | ICD-10-CM

## 2021-12-04 ENCOUNTER — Ambulatory Visit: Payer: Medicare HMO | Admitting: Physician Assistant

## 2021-12-09 ENCOUNTER — Ambulatory Visit: Payer: Medicare HMO | Admitting: Physician Assistant

## 2021-12-14 ENCOUNTER — Encounter: Payer: Self-pay | Admitting: Physician Assistant

## 2021-12-14 ENCOUNTER — Ambulatory Visit: Payer: Medicare HMO | Admitting: Physician Assistant

## 2021-12-14 VITALS — BP 156/72 | HR 61 | Resp 18 | Ht 65.0 in | Wt 172.0 lb

## 2021-12-14 DIAGNOSIS — R413 Other amnesia: Secondary | ICD-10-CM | POA: Diagnosis not present

## 2021-12-14 NOTE — Patient Instructions (Addendum)
It was a pleasure to see you today at our office.   Recommendations:  Follow up in  6 months Continue donepezil 10 mg  FOllow up with psychiatry  Repeat the neurocognitive testing   Whom to call:  Memory  decline, memory medications: Call our office [phone]   For psychiatric meds, mood meds: Please have your primary care physician manage these medications.   Counseling regarding caregiver distress, including caregiver depression, anxiety and issues regarding community resources, adult day care programs, adult living facilities, or memory care questions:   Feel free to contact [place], Social Worker at [phone]   For assessment of decision of mental capacity and competency:  Call Dr. Anthoney Harada, geriatric psychiatrist at [phone]  For guidance in geriatric dementia issues please call [facility] [phone]  For guidance regarding [facility] and if placement were needed at the facility, contact Arnell Asal, Social Worker tel: [phone]  If you have any severe symptoms of a stroke, or other severe issues such as confusion,severe chills or fever, etc call 911 or go to the ER as you may need to be evaluated further   Feel free to visit Facebook page " [facility]" for tips of how to care for people with memory problems.    RECOMMENDATIONS FOR ALL PATIENTS WITH MEMORY PROBLEMS: 1. Continue to exercise (Recommend 30 minutes of walking everyday, or 3 hours every week) 2. Increase social interactions - continue going to [place] and enjoy social gatherings with friends and family 3. Eat healthy, avoid fried foods and eat more fruits and vegetables 4. Maintain adequate blood pressure, blood sugar, and blood cholesterol level. Reducing the risk of stroke and cardiovascular disease also helps promoting better memory. 5. Avoid stressful situations. Live a simple life and avoid aggravations. Organize your time and prepare for the  next day in anticipation. 6. Sleep well, avoid any interruptions of sleep and avoid any distractions in the bedroom that may interfere with adequate sleep quality 7. Avoid sugar, avoid sweets as there is a strong link between excessive sugar intake, diabetes, and cognitive impairment We discussed the Mediterranean diet, which has been shown to help patients reduce the risk of progressive memory disorders and reduces cardiovascular risk. This includes eating fish, eat fruits and green leafy vegetables, nuts like almonds and hazelnuts, walnuts, and also use olive oil. Avoid fast foods and fried foods as much as possible. Avoid sweets and sugar as sugar use has been linked to worsening of memory function.  There is always a concern of gradual progression of memory problems. If this is the case, then we may need to adjust level of care according to patient needs. Support, both to the patient and caregiver, should then be put into place.    FALL PRECAUTIONS: Be cautious when walking. Scan the area for obstacles that may increase the risk of trips and falls. When getting up in the mornings, sit up at the edge of the bed for a few minutes before getting out of bed. Consider elevating the bed at the head end to avoid drop of blood pressure when getting up. Walk always in a well-lit room (use night lights in the walls). Avoid area rugs or power cords from appliances in the middle of the walkways. Use a walker or a cane if necessary and consider physical therapy for balance exercise. Get your eyesight checked regularly.  FINANCIAL OVERSIGHT: Supervision, especially oversight when making financial decisions or transactions is also recommended.  HOME SAFETY:  Consider the safety of the kitchen when operating appliances like stoves, microwave oven, and blender. Consider having supervision and share cooking responsibilities until no longer able to participate in those. Accidents with firearms and other hazards in the  house should be identified and addressed as well.   ABILITY TO BE LEFT ALONE: If patient is unable to contact 911 operator, consider using LifeLine, or when the need is there, arrange for someone to stay with patients. Smoking is a fire hazard, consider supervision or cessation. Risk of wandering should be assessed by caregiver and if detected at any point, supervision and safe proof recommendations should be instituted.  MEDICATION SUPERVISION: Inability to self-administer medication needs to be constantly addressed. Implement a mechanism to ensure safe administration of the medications.   DRIVING: Regarding driving, in patients with progressive memory problems, driving will be impaired. We advise to have someone else do the driving if trouble finding directions or if minor accidents are reported. Independent driving assessment is available to determine safety of driving.   If you are interested in the driving assessment, you can contact the following:  [employer] in [place]  [place] [phone]  [employer]  [facility] [phone] or [phone]

## 2021-12-14 NOTE — Progress Notes (Unsigned)
Assessment/Plan:   MIld Cognitive Impairment of unclear etiology  This is a 76 yo LH woman with a history of anxiety,depression, with Neuropsychological evaluation indicating Mild Neurocognitive Disorder,etiology unclear. There was significant difficulty learning and later accessing learned information, but not a clear memory storage deficit that is typically seen with AD, although leukopenic subtype PPA remained on the differential, unlikely at the present time.  Vascular etiology may be consistent with her testing but neuroimaging was not consistent.  The patient has mild depression and anxiety and according to her husband possible OCD which may exacerbate underlying dysfunction; this is followed by psychiatry. No compelling evidence of LBD.  Last MoCA on 12/2020 was 17/30 . Patient is on donepezil 10 mg daily tolerating well.   Recommendations:    Follow up with Psychiatry Continue donepezil 10 mg daily Repeat neurocognitive testing for diagnostic clarity and disease trajectory Follow up in 6  months.   Case discussed with Dr. Delice Smith who agrees with the plan    Subjective:    Diana Smith is a very pleasant 76 y.o. RH female  seen today in follow up for memory loss. This patient is accompanied in the office by her husband who supplements the history.  Previous records as well as any outside records available were reviewed prior to todays visit.  Patient was last seen at our office on 08/24/2021 at which time her  Patient is currently on   Any changes in memory since last visit?  Patient believes "it is better ", especially STM>LTM  Patient lives with: Spouse her 7 year old mother, who according to the patient's husband rules "all of her time and refuses to go to an ALF "he believes that when she is under stress her memory is worse. repeats oneself?  "Constantly, she repeats the question every 10 minutes " Disoriented when walking into a room?  Patient denies   Leaving objects  in unusual places?  Patient denies   Ambulates  with difficulty?   Patient denies   Recent falls?  Patient denies   Any head injuries?  Patient denies   History of seizures?   Patient denies   Wandering behavior?  Patient denies   Patient drives?  She continues to drive, but she prefers to do so on the daytime, because she has to think ahead and it makes her nervous. Any mood changes or worsening depression?:  Patient denies   Hallucinations?  She always see this "something in the corner of my eye "but not as often as before ". Paranoia?  She is always "paranoid about  someone coming in the house or breaking" Patient reports that he sleeps well may have vivid dreams, sometimes she talks in her sleep and screams out.  Denies sleepwalking.  History of sleep apnea?  Patient denies   Any hygiene concerns?  Patient denies   Independent of bathing and dressing?  Endorsed  Does the patient needs help with medications?  Husband is in charge of the medications Who is in charge of the finances?  Husband is in charge Any changes in appetite?  Patient denies   Patient have trouble swallowing? Patient denies   Does the patient cook?  Patient denies   Any kitchen accidents such as leaving the stove on? Patient denies   Any headaches?  Patient denies   The double vision? Patient denies   Any focal numbness or tingling?  Patient denies   Chronic back pain Patient denies   Unilateral weakness?  Patient denies  Any tremors?  Patient denies   Any history of anosmia?  Patient denies   Any incontinence of urine?  Denies  any bowel dysfunction?  Denies    History on Initial Assessment 01/08/2021: This is a 76 year old left-handed woman with a history of anxiety,depression, presenting for evaluation of memory loss. She is not sure of her age and states she is 43. She gets emotional speaking about her memory. Her husband started noticing changes around 2 years ago, "she was just so upset I thought she was  bipolar." She would get very upset then calm down. She was started on Paxil which helped, if she does not take it she gets really upset. He notices she repeats the same question 4-5 times. She denies getting lost driving but has to concentrate where she is going, realizing she had to turn previously. She used to manage finances without difficulties, her husband took over 4-5 years ago because she could not do [employer]. She rarely misses her medications. She is tearful and notes waking up feeling depressed. She takes her medication and starts doing things around the house and starts feeling better. There are several family issues, they have taken care of their 2 grandchildren since 2014. Her son does not have a good relationship with them which bothers her. She gets choked up when he relates that her 7 year old mother calls her daily that the mother feels depressed, "she worries herself to death about her mother," who only wants Diana Smith to stay with her. This has been ongoing for the past 2 years. She gets nervous worrying about her mother and grandchildren. They are on a weird schedule with their grandchildren, she gets 8 hours of sleep but does not feel rested. Her husband notes she talks in her sleep, sometimes screams out, opens her eyes and looks at him, then a few minutes later talks again "like in a zone." These only occur at night, around three times a week. She acts out her dreams. No hallucinations. She states she worries about everything. She always had "a taste of anxiety when younger," stating it just seems so complicated and she is so sorry it is going on.    She denies any headaches, dizziness, vision changes, focal numbness/tingling/weakness, bowel/bladder dysfunction, anosmia, or tremors. She has right shoulder pain. She denies any falls but is more careful walking up steps. Her father had memory issues, her maternal grandmother "was a little crazy." No history of significant head injuries or  alcohol use.    Diagnostic Data: Brain MRI without contrast done 12/2020 no acute changes, mild to moderate atrophy and mild chronic microvascular disease.   Neuropsychological evaluation done 04/2021 indicated Mild Neurocognitive Disorder,etiology unclear. Findings did not suggest strong evidence for a memory storage deficit, rather there was significant difficulty learning and later accessing learned information. Findings were inconsistent with AD, although logopenic subtype PPA remained on the differential, but unlikely at present time. Vascular etiology may be consistent with her testing, however neuroimaging was not consistent. No compelling evidence for LBD or bvFTD. Mild depression and anxiety were not felt to be solely responsible for cognitive changes, but likely exacerbating underlying dysfunction PREVIOUS MEDICATIONS:   CURRENT MEDICATIONS:  Outpatient Encounter Medications as of 12/14/2021  Medication Sig   ALPRAZolam (XANAX) 0.25 MG tablet Take 0.25 mg by mouth See admin instructions. Take 1/2 tablet by mouth daily if needed take 1 tablet as needed for anxiety   Biotin 1000 MCG CHEW Chew by mouth.  Biotin 1000 MCG tablet 1 tablet   busPIRone (BUSPAR) 15 MG tablet Take 1 tablet by mouth 3 (three) times daily.   calcium carbonate (OSCAL) 1500 (600 Ca) MG TABS tablet Take by mouth 2 (two) times daily with a meal.   donepezil (ARICEPT) 10 MG tablet Take 1 tablet every morning   folic acid (FOLVITE) 830 MCG tablet Take 400 mcg by mouth daily.   Melatonin 10 MG CAPS Take by mouth.   Multiple Vitamins-Minerals (PRESERVISION AREDS PO) Take by mouth.   omeprazole (PRILOSEC) 20 MG capsule Take 20 mg by mouth daily.   PARoxetine (PAXIL) 30 MG tablet Take 40 mg by mouth every morning.   tiZANidine (ZANAFLEX) 4 MG tablet Take 1 tablet (4 mg total) by mouth every 6 (six) hours as needed for muscle spasms.   vitamin B-12 (CYANOCOBALAMIN) 500 MCG tablet Take 500 mcg by mouth daily.   vitamin B-12  (CYANOCOBALAMIN) 500 MCG tablet 1 tablet   doxylamine, Sleep, (SLEEP AID) 25 MG tablet 1 tablet at bedtime as needed (Patient not taking: Reported on 12/14/2021)   No facility-administered encounter medications on file as of 12/14/2021.        No data to display            01/08/2021    9:00 AM  Montreal Cognitive Assessment   Visuospatial/ Executive (0/5) 4  Naming (0/3) 2  Attention: Read list of digits (0/2) 1  Attention: Read list of letters (0/1) 0  Attention: Serial 7 subtraction starting at 100 (0/3) 1  Language: Repeat phrase (0/2) 0  Language : Fluency (0/1) 0  Abstraction (0/2) 2  Delayed Recall (0/5) 0  Orientation (0/6) 6  Total 16  Adjusted Score (based on education) 17    Objective:     PHYSICAL EXAMINATION:    VITALS:   Vitals:   12/14/21 1430  BP: (!) 156/72  Pulse: 61  Resp: 18  SpO2: 95%  Weight: 172 lb (78 kg)  Height: 5' 5"  (1.651 m)    GEN:  The patient appears stated age and is in NAD. HEENT:  Normocephalic, atraumatic.   Neurological examination:  General: NAD, well-groomed, appears stated age. Anxious appearing, constantly rearranging her meds Orientation: The patient is alert. Oriented to person, place and date Cranial nerves: There is good facial symmetry.The speech is fluent and clear, tangential at times, especial when easily distracted. No aphasia or dysarthria. Fund of knowledge is appropriate. Recent and remote memory are impaired. Attention and concentration are reduced.  Able to name objects and repeat phrases.  Hearing is intact to conversational tone.    Sensation: Sensation is intact to light touch throughout Motor: Strength is at least antigravity x4. Tremors: none  DTR's 2/4 in UE/LE     Movement examination: Tone: There is normal tone in the UE/LE Abnormal movements:  no tremor.  No myoclonus.  No asterixis.   Coordination:  There is no decremation with RAM's. Normal finger to nose  Gait and Station: The patient has  no difficulty arising out of a deep-seated chair without the use of the hands. The patient's stride length is good.  Gait is cautious and narrow.    Thank you for allowing [place] the opportunity to participate in the care of this nice patient. Please do not hesitate to contact us for any questions or concerns.   Total time spent on today's visit was 23 minutes dedicated to this patient today, preparing to see patient, examining the patient, ordering tests and/or  medications and counseling the patient, documenting clinical information in the EHR or other health record, independently interpreting results and communicating results to the patient/family, discussing treatment and goals, answering patient's questions and coordinating care.  Cc:  Jonathon Jordan, MD  Sharene Butters 12/14/2021 2:41 PM

## 2021-12-16 IMAGING — MR MR HEAD W/O CM
11 series · 48 of 48 positions shown · non-contrast
Comparison: None.

CLINICAL DATA: Memory loss.

EXAM:
MRI HEAD WITHOUT CONTRAST
TECHNIQUE: Multiplanar, multiecho pulse sequences of the brain and surrounding
structures were obtained without intravenous contrast.

[Series 5: T1 · sagittal · 4.0mm · 0.75mm/px · 1 of 31 slices shown (1 of 2)]
[im 1/31]
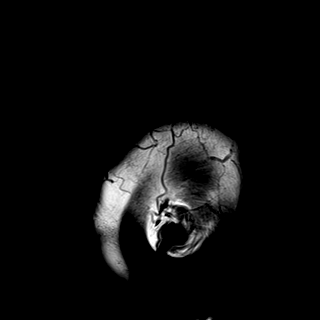

[Series 6: DWI · axial · 3.0mm · 0.94mm/px · z∈[-107,+47]mm · 11 of 175 slices shown (1 of 3)]
[im 1/175]
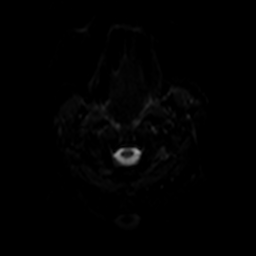
[im 18/175]
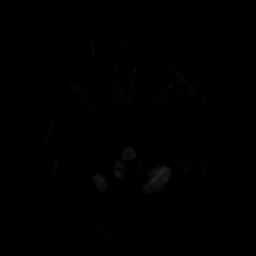
[im 35/175]
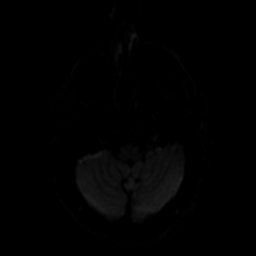
[im 53/175]
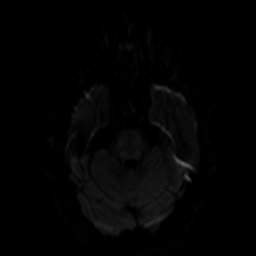
[im 70/175]
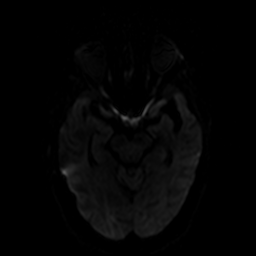
[im 88/175]
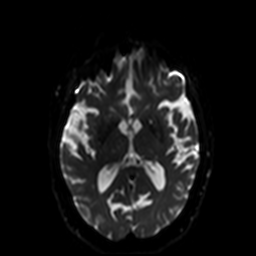
[im 105/175]
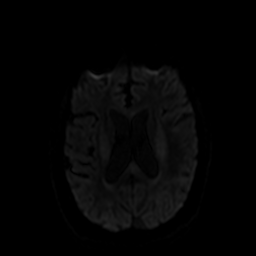
[im 122/175]
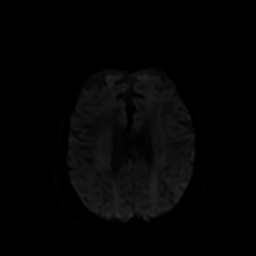
[im 140/175]
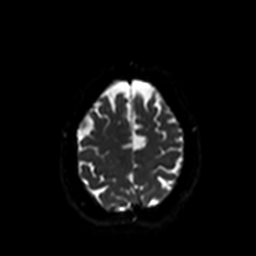
[im 157/175]
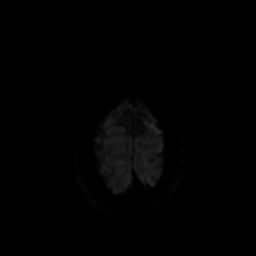
[im 175/175]
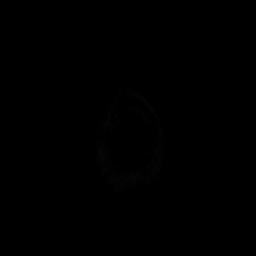

[Series 7: ax dwi_tracew · axial · 3.0mm · 0.94mm/px · z∈[-107,+47]mm · 5 of 88 slices shown]
[im 1/88]
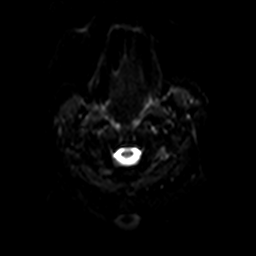
[im 22/88]
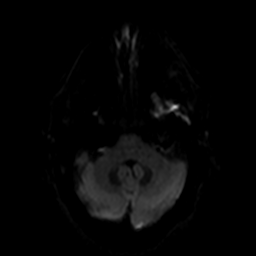
[im 44/88]
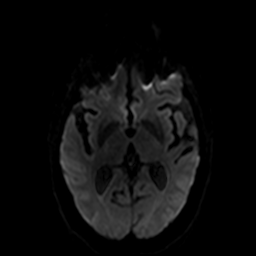
[im 66/88]
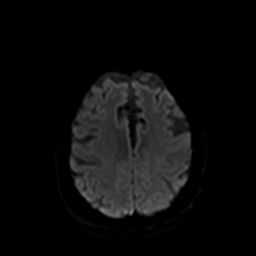
[im 88/88]
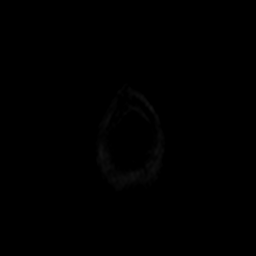

[Series 8: ax dwi_adc · axial · 3.0mm · 0.94mm/px · z∈[-107,+47]mm · 3 of 44 slices shown]
[im 1/44]
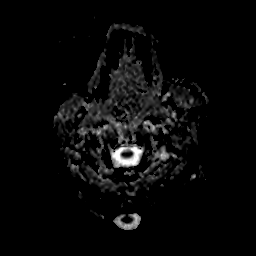
[im 22/44]
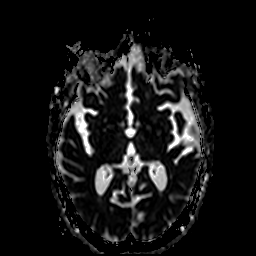
[im 44/44]
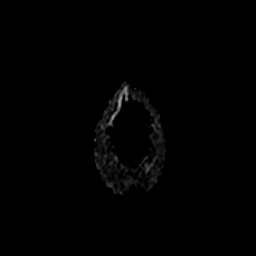

[Series 9: DWI · coronal · 5.0mm · 1.44mm/px · 4 of 60 slices shown (2 of 3)]
[im 1/60]
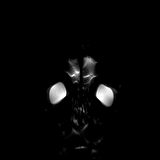
[im 20/60]
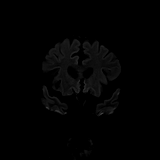
[im 40/60]
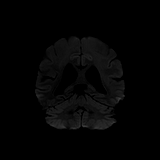
[im 60/60]
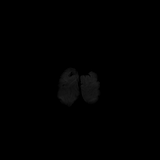

[Series 10: DWI · coronal · 5.0mm · 1.44mm/px · 2 of 30 slices shown (3 of 3)]
[im 1/30]
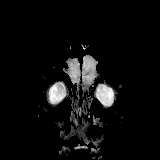
[im 30/30]
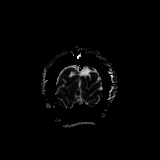

[Series 11: T2 · axial · 4.0mm · 0.36mm/px · z∈[-104,+47]mm · 2 of 30 slices shown (1 of 2)]
[im 1/30]
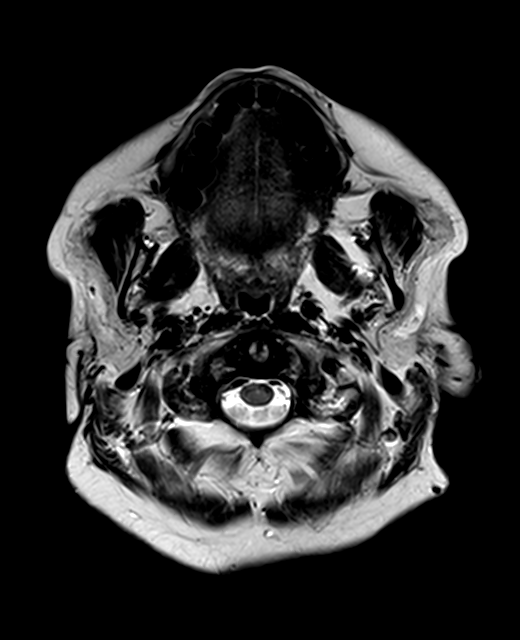
[im 30/30]
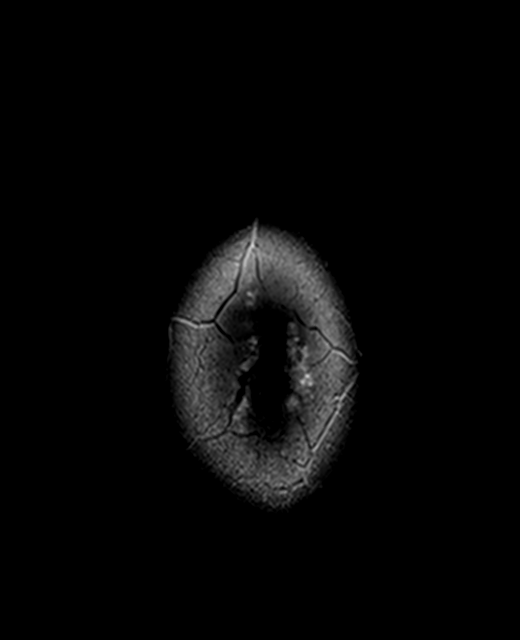

[Series 12: FLAIR · axial · 3.0mm · 0.72mm/px · z∈[-105,+45]mm · 2 of 26 slices shown]
[im 1/26]
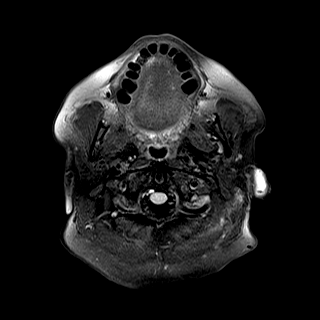
[im 26/26]
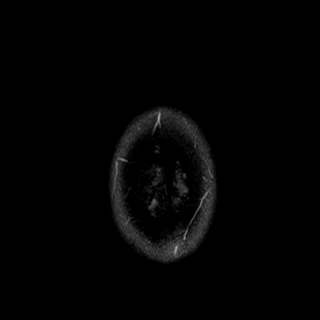

[Series 13: swi_images · axial · 1.5mm · 0.90mm/px · z∈[-100,+43]mm · 6 of 96 slices shown]
[im 1/96]
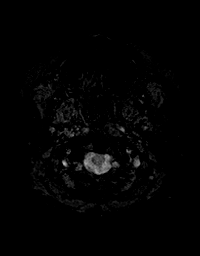
[im 20/96]
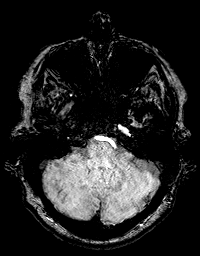
[im 39/96]
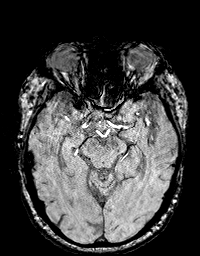
[im 58/96]
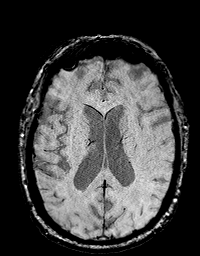
[im 77/96]
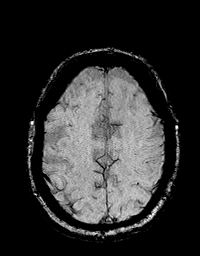
[im 96/96]
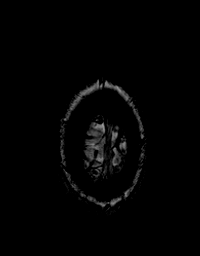

[Series 15: T1 · axial · 1.0mm · 0.94mm/px · z∈[-108,+51]mm · 10 of 160 slices shown (2 of 2)]
[im 1/160]
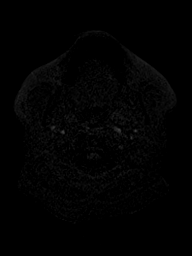
[im 18/160]
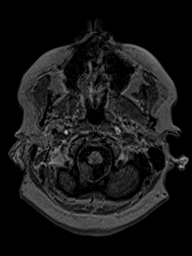
[im 36/160]
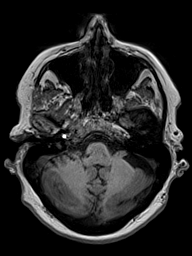
[im 54/160]
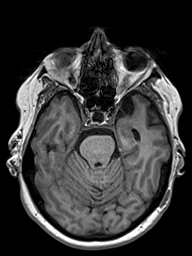
[im 71/160]
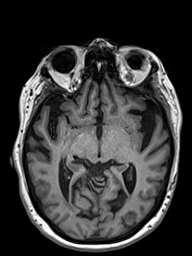
[im 89/160]
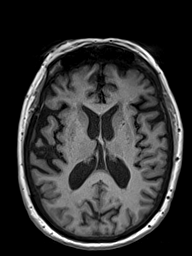
[im 107/160]
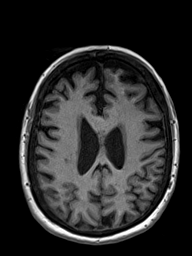
[im 124/160]
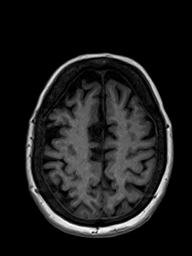
[im 142/160]
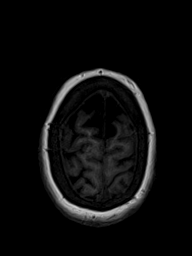
[im 160/160]
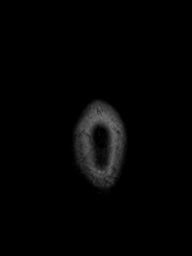

[Series 16: T2 · coronal · 4.5mm · 0.36mm/px · 2 of 30 slices shown (2 of 2)]
[im 1/30]
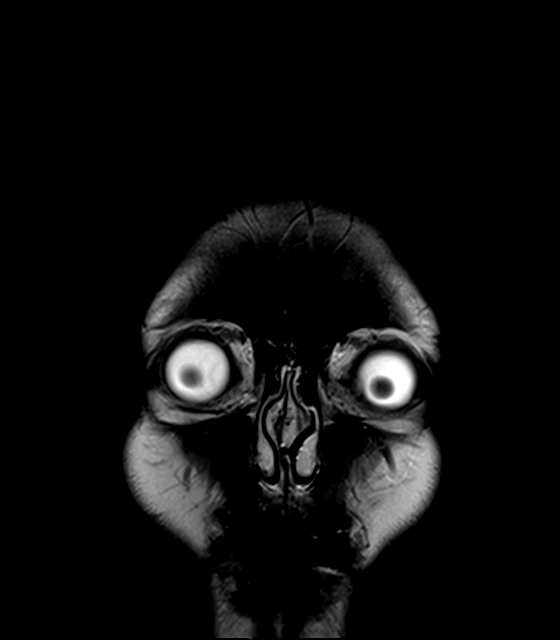
[im 30/30]
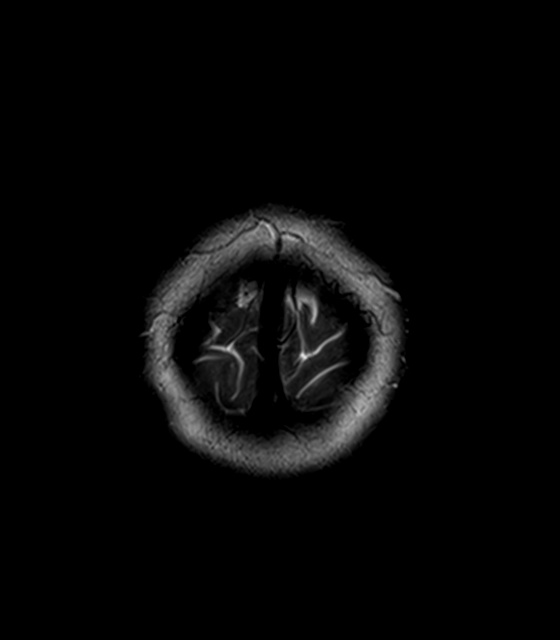

[48 of 48 positions shown; findings below may reference images not displayed]

FINDINGS: Brain: No acute infarction, hemorrhage, hydrocephalus, or
extra-axial fluid collection. Partially empty sella. Approximately 8
mm pineal cyst without significant mass effect. Mild for age
supratentorial and pontine T2/FLAIR hyperintensities, nonspecific
but most likely related to chronic microvascular ischemic disease.
Mild to moderate atrophy with ex vacuo ventricular dilation.

Vascular: Major arterial flow voids are maintained at the skull
base.

Skull and upper cervical spine: Normal marrow signal.

Sinuses/Orbits: Sinuses are largely clear. No acute orbital finding.

Other: Small bilateral mastoid effusions.
IMPRESSION: 1. No evidence of acute intracranial abnormality.
2. Mild-to-moderate atrophy and mild chronic microvascular ischemic
disease.

## 2022-01-01 DIAGNOSIS — R69 Illness, unspecified: Secondary | ICD-10-CM | POA: Diagnosis not present

## 2022-01-01 DIAGNOSIS — I10 Essential (primary) hypertension: Secondary | ICD-10-CM | POA: Diagnosis not present

## 2022-02-17 DIAGNOSIS — R69 Illness, unspecified: Secondary | ICD-10-CM | POA: Diagnosis not present

## 2022-02-17 DIAGNOSIS — I4510 Unspecified right bundle-branch block: Secondary | ICD-10-CM | POA: Diagnosis not present

## 2022-02-17 DIAGNOSIS — Z79899 Other long term (current) drug therapy: Secondary | ICD-10-CM | POA: Diagnosis not present

## 2022-02-17 DIAGNOSIS — E559 Vitamin D deficiency, unspecified: Secondary | ICD-10-CM | POA: Diagnosis not present

## 2022-02-17 DIAGNOSIS — I679 Cerebrovascular disease, unspecified: Secondary | ICD-10-CM | POA: Diagnosis not present

## 2022-02-17 DIAGNOSIS — Z0000 Encounter for general adult medical examination without abnormal findings: Secondary | ICD-10-CM | POA: Diagnosis not present

## 2022-02-17 DIAGNOSIS — K219 Gastro-esophageal reflux disease without esophagitis: Secondary | ICD-10-CM | POA: Diagnosis not present

## 2022-02-17 DIAGNOSIS — K7581 Nonalcoholic steatohepatitis (NASH): Secondary | ICD-10-CM | POA: Diagnosis not present

## 2022-02-17 DIAGNOSIS — R7303 Prediabetes: Secondary | ICD-10-CM | POA: Diagnosis not present

## 2022-02-17 DIAGNOSIS — E538 Deficiency of other specified B group vitamins: Secondary | ICD-10-CM | POA: Diagnosis not present

## 2022-02-17 DIAGNOSIS — I10 Essential (primary) hypertension: Secondary | ICD-10-CM | POA: Diagnosis not present

## 2022-02-17 DIAGNOSIS — M8580 Other specified disorders of bone density and structure, unspecified site: Secondary | ICD-10-CM | POA: Diagnosis not present

## 2022-03-08 ENCOUNTER — Telehealth: Payer: Self-pay | Admitting: Physician Assistant

## 2022-03-08 ENCOUNTER — Ambulatory Visit: Payer: Self-pay

## 2022-03-08 NOTE — Telephone Encounter (Signed)
Left message with the after hour service on 03-08-22 at 12:12 pm   Patient husband returning a call to the office

## 2022-03-08 NOTE — Telephone Encounter (Signed)
Dizzy, feels strange.off Donepezil x one week, he said he wants to know if he should restart her back? He is very anxious, but doesn't think she should go to ER, he says it is the side effects please advise.

## 2022-03-08 NOTE — Telephone Encounter (Signed)
Pt husband called, pam stopped taking her donepezil and now she is having adverse effects. She was having dreams so she stopped taking it. Saturday her BP 181-81. He tried to take her to Urgent care but it was too busy. Her blood pressure kept dropping, and she has been very anxious/nervous. He would like a call back asap.  He is very worried about her. He would prefer to speak with sara.

## 2022-03-08 NOTE — Telephone Encounter (Signed)
Pt husband is calling PCP to get an appointment. They will hold donepezil for 2 weeks to see if this is the cause of her dizziness.

## 2022-03-09 DIAGNOSIS — R69 Illness, unspecified: Secondary | ICD-10-CM | POA: Diagnosis not present

## 2022-03-09 DIAGNOSIS — Z23 Encounter for immunization: Secondary | ICD-10-CM | POA: Diagnosis not present

## 2022-03-09 DIAGNOSIS — I10 Essential (primary) hypertension: Secondary | ICD-10-CM | POA: Diagnosis not present

## 2022-03-09 DIAGNOSIS — R42 Dizziness and giddiness: Secondary | ICD-10-CM | POA: Diagnosis not present

## 2022-03-19 DIAGNOSIS — M1712 Unilateral primary osteoarthritis, left knee: Secondary | ICD-10-CM | POA: Diagnosis not present

## 2022-03-19 DIAGNOSIS — M1711 Unilateral primary osteoarthritis, right knee: Secondary | ICD-10-CM | POA: Diagnosis not present

## 2022-03-21 DIAGNOSIS — M1712 Unilateral primary osteoarthritis, left knee: Secondary | ICD-10-CM

## 2022-03-21 HISTORY — DX: Unilateral primary osteoarthritis, left knee: M17.12

## 2022-03-26 DIAGNOSIS — I4510 Unspecified right bundle-branch block: Secondary | ICD-10-CM | POA: Diagnosis not present

## 2022-03-26 DIAGNOSIS — I10 Essential (primary) hypertension: Secondary | ICD-10-CM | POA: Diagnosis not present

## 2022-03-26 DIAGNOSIS — R42 Dizziness and giddiness: Secondary | ICD-10-CM | POA: Diagnosis not present

## 2022-03-26 DIAGNOSIS — R69 Illness, unspecified: Secondary | ICD-10-CM | POA: Diagnosis not present

## 2022-04-02 ENCOUNTER — Telehealth: Payer: Self-pay | Admitting: Physician Assistant

## 2022-04-02 NOTE — Telephone Encounter (Signed)
Patient's husband called to report the patient has been crying hysterically for about a week.   Patient is still taking denepezil.

## 2022-04-02 NOTE — Telephone Encounter (Signed)
Called patients husband and informed him that per Clarise Cruz patient needs to follow up with pcp for adjustment of medications and to address the concerns he has in regards to her crying. Patients husband stated that they have been to the PCP and he was told that patient needs to see psychiatry. I informed patients husband that that is what Clarise Cruz is recommending as patient needs to see Psychiatry and to contact the PCP to get that referral. {Patients husband verbalized understanding and had no further questions or concerns.

## 2022-04-02 NOTE — Telephone Encounter (Signed)
Called and spoke to patients husband and informed him of Sara's advice. Patients husband stated that she does not have a Psychiatrist and would like to know where he can find one. I apologized to patients husband and informed him that I was unaware of this and would send his message back to sara for advice on this matter.

## 2022-04-07 DIAGNOSIS — R69 Illness, unspecified: Secondary | ICD-10-CM | POA: Diagnosis not present

## 2022-04-07 DIAGNOSIS — F411 Generalized anxiety disorder: Secondary | ICD-10-CM | POA: Diagnosis not present

## 2022-04-08 ENCOUNTER — Ambulatory Visit: Payer: Medicare HMO | Admitting: Psychologist

## 2022-04-09 ENCOUNTER — Other Ambulatory Visit (HOSPITAL_COMMUNITY): Payer: Self-pay | Admitting: Family Medicine

## 2022-04-09 DIAGNOSIS — I10 Essential (primary) hypertension: Secondary | ICD-10-CM

## 2022-04-09 DIAGNOSIS — F411 Generalized anxiety disorder: Secondary | ICD-10-CM | POA: Diagnosis not present

## 2022-04-09 DIAGNOSIS — G309 Alzheimer's disease, unspecified: Secondary | ICD-10-CM | POA: Diagnosis not present

## 2022-04-09 DIAGNOSIS — F331 Major depressive disorder, recurrent, moderate: Secondary | ICD-10-CM | POA: Diagnosis not present

## 2022-04-13 ENCOUNTER — Ambulatory Visit: Payer: Medicare HMO | Admitting: Psychologist

## 2022-04-14 ENCOUNTER — Ambulatory Visit (HOSPITAL_COMMUNITY): Payer: Medicare HMO | Attending: Internal Medicine

## 2022-04-14 DIAGNOSIS — I10 Essential (primary) hypertension: Secondary | ICD-10-CM | POA: Insufficient documentation

## 2022-04-14 LAB — ECHOCARDIOGRAM COMPLETE
Area-P 1/2: 1.53 cm2
P 1/2 time: 732 ms
S' Lateral: 3.4 cm

## 2022-04-23 ENCOUNTER — Telehealth: Payer: Self-pay | Admitting: Physician Assistant

## 2022-04-23 NOTE — Telephone Encounter (Signed)
Pt's husband called in and left a message stating the pt is having adverse reactions to the donepezil. He would like to speak with someone.

## 2022-04-23 NOTE — Telephone Encounter (Signed)
Pt husband called informed Dr Delice Lesch wanted them to stop the Donepezil and update in 1 week on how she is doing off medication. Pt husband verbalized understanding,

## 2022-04-23 NOTE — Telephone Encounter (Signed)
Pt husband called no answer left a voice mail to call back to let us know what kind of reaction his wife is having to the donepezil

## 2022-04-23 NOTE — Telephone Encounter (Signed)
Pls have him stop the Donepezil and update in 1 week on how she is doing off medication, thanks

## 2022-04-23 NOTE — Telephone Encounter (Signed)
Patient's husband called to say she is not having an appetite, she is having bouts of diarrhea, because of this she is also feeling depressed.

## 2022-04-24 ENCOUNTER — Inpatient Hospital Stay (HOSPITAL_COMMUNITY)
Admission: EM | Admit: 2022-04-24 | Discharge: 2022-04-26 | DRG: 690 | Disposition: A | Discharge status: Home / Self Care | Payer: Medicare HMO | Attending: Internal Medicine | Admitting: Internal Medicine

## 2022-04-24 ENCOUNTER — Other Ambulatory Visit: Payer: Self-pay

## 2022-04-24 ENCOUNTER — Emergency Department (HOSPITAL_COMMUNITY): Payer: Medicare HMO

## 2022-04-24 ENCOUNTER — Encounter (HOSPITAL_COMMUNITY): Payer: Self-pay | Admitting: Emergency Medicine

## 2022-04-24 DIAGNOSIS — R7303 Prediabetes: Secondary | ICD-10-CM | POA: Diagnosis present

## 2022-04-24 DIAGNOSIS — E7800 Pure hypercholesterolemia, unspecified: Secondary | ICD-10-CM | POA: Diagnosis present

## 2022-04-24 DIAGNOSIS — F02818 Dementia in other diseases classified elsewhere, unspecified severity, with other behavioral disturbance: Secondary | ICD-10-CM | POA: Diagnosis present

## 2022-04-24 DIAGNOSIS — R1084 Generalized abdominal pain: Secondary | ICD-10-CM | POA: Diagnosis not present

## 2022-04-24 DIAGNOSIS — E8729 Other acidosis: Secondary | ICD-10-CM

## 2022-04-24 DIAGNOSIS — Z86010 Personal history of colonic polyps: Secondary | ICD-10-CM

## 2022-04-24 DIAGNOSIS — F419 Anxiety disorder, unspecified: Secondary | ICD-10-CM | POA: Diagnosis not present

## 2022-04-24 DIAGNOSIS — R109 Unspecified abdominal pain: Secondary | ICD-10-CM

## 2022-04-24 DIAGNOSIS — G4700 Insomnia, unspecified: Secondary | ICD-10-CM

## 2022-04-24 DIAGNOSIS — F0283 Dementia in other diseases classified elsewhere, unspecified severity, with mood disturbance: Secondary | ICD-10-CM | POA: Diagnosis present

## 2022-04-24 DIAGNOSIS — F411 Generalized anxiety disorder: Secondary | ICD-10-CM | POA: Diagnosis present

## 2022-04-24 DIAGNOSIS — E441 Mild protein-calorie malnutrition: Secondary | ICD-10-CM | POA: Diagnosis present

## 2022-04-24 DIAGNOSIS — Y92009 Unspecified place in unspecified non-institutional (private) residence as the place of occurrence of the external cause: Secondary | ICD-10-CM | POA: Diagnosis not present

## 2022-04-24 DIAGNOSIS — R69 Illness, unspecified: Secondary | ICD-10-CM | POA: Diagnosis not present

## 2022-04-24 DIAGNOSIS — N390 Urinary tract infection, site not specified: Secondary | ICD-10-CM | POA: Diagnosis not present

## 2022-04-24 DIAGNOSIS — I10 Essential (primary) hypertension: Secondary | ICD-10-CM

## 2022-04-24 DIAGNOSIS — Z6827 Body mass index (BMI) 27.0-27.9, adult: Secondary | ICD-10-CM

## 2022-04-24 DIAGNOSIS — G309 Alzheimer's disease, unspecified: Secondary | ICD-10-CM | POA: Diagnosis present

## 2022-04-24 DIAGNOSIS — Z888 Allergy status to other drugs, medicaments and biological substances status: Secondary | ICD-10-CM

## 2022-04-24 DIAGNOSIS — N3000 Acute cystitis without hematuria: Secondary | ICD-10-CM | POA: Diagnosis not present

## 2022-04-24 DIAGNOSIS — R911 Solitary pulmonary nodule: Secondary | ICD-10-CM

## 2022-04-24 DIAGNOSIS — Z87440 Personal history of urinary (tract) infections: Secondary | ICD-10-CM

## 2022-04-24 DIAGNOSIS — Z8616 Personal history of COVID-19: Secondary | ICD-10-CM

## 2022-04-24 DIAGNOSIS — I4510 Unspecified right bundle-branch block: Secondary | ICD-10-CM | POA: Diagnosis present

## 2022-04-24 DIAGNOSIS — E8809 Other disorders of plasma-protein metabolism, not elsewhere classified: Secondary | ICD-10-CM

## 2022-04-24 DIAGNOSIS — E46 Unspecified protein-calorie malnutrition: Secondary | ICD-10-CM

## 2022-04-24 DIAGNOSIS — T730XXA Starvation, initial encounter: Secondary | ICD-10-CM | POA: Diagnosis not present

## 2022-04-24 DIAGNOSIS — E860 Dehydration: Secondary | ICD-10-CM | POA: Diagnosis present

## 2022-04-24 DIAGNOSIS — F329 Major depressive disorder, single episode, unspecified: Secondary | ICD-10-CM | POA: Diagnosis present

## 2022-04-24 DIAGNOSIS — K219 Gastro-esophageal reflux disease without esophagitis: Secondary | ICD-10-CM | POA: Diagnosis not present

## 2022-04-24 DIAGNOSIS — F0284 Dementia in other diseases classified elsewhere, unspecified severity, with anxiety: Secondary | ICD-10-CM | POA: Diagnosis not present

## 2022-04-24 HISTORY — DX: Solitary pulmonary nodule: R91.1

## 2022-04-24 HISTORY — DX: Other acidosis: E87.29

## 2022-04-24 HISTORY — DX: Insomnia, unspecified: G47.00

## 2022-04-24 HISTORY — DX: Urinary tract infection, site not specified: N39.0

## 2022-04-24 HISTORY — DX: Other disorders of plasma-protein metabolism, not elsewhere classified: E88.09

## 2022-04-24 HISTORY — DX: Unspecified abdominal pain: R10.9

## 2022-04-24 HISTORY — DX: Essential (primary) hypertension: I10

## 2022-04-24 LAB — CBC WITH DIFFERENTIAL/PLATELET
Abs Immature Granulocytes: 0.01 10*3/uL (ref 0.00–0.07)
Basophils Absolute: 0 10*3/uL (ref 0.0–0.1)
Basophils Relative: 0 %
Eosinophils Absolute: 0 10*3/uL (ref 0.0–0.5)
Eosinophils Relative: 0 %
HCT: 39.4 % (ref 36.0–46.0)
Hemoglobin: 13.7 g/dL (ref 12.0–15.0)
Immature Granulocytes: 0 %
Lymphocytes Relative: 27 %
Lymphs Abs: 2 10*3/uL (ref 0.7–4.0)
MCH: 32.7 pg (ref 26.0–34.0)
MCHC: 34.8 g/dL (ref 30.0–36.0)
MCV: 94 fL (ref 80.0–100.0)
Monocytes Absolute: 0.5 10*3/uL (ref 0.1–1.0)
Monocytes Relative: 7 %
Neutro Abs: 4.9 10*3/uL (ref 1.7–7.7)
Neutrophils Relative %: 66 %
Platelets: 261 10*3/uL (ref 150–400)
RBC: 4.19 MIL/uL (ref 3.87–5.11)
RDW: 13.4 % (ref 11.5–15.5)
WBC: 7.5 10*3/uL (ref 4.0–10.5)
nRBC: 0 % (ref 0.0–0.2)

## 2022-04-24 LAB — BASIC METABOLIC PANEL
Anion gap: 8 (ref 5–15)
BUN: 8 mg/dL (ref 8–23)
CO2: 26 mmol/L (ref 22–32)
Calcium: 9.2 mg/dL (ref 8.9–10.3)
Chloride: 108 mmol/L (ref 98–111)
Creatinine, Ser: 0.76 mg/dL (ref 0.44–1.00)
GFR, Estimated: >60 mL/min (ref >60)
Glucose, Bld: 107 mg/dL — ABNORMAL HIGH (ref 70–99)
Potassium: 3.1 mmol/L — ABNORMAL LOW (ref 3.5–5.1)
Sodium: 142 mmol/L (ref 135–145)

## 2022-04-24 LAB — COMPREHENSIVE METABOLIC PANEL
ALT: 15 U/L (ref 0–44)
AST: 18 U/L (ref 15–41)
Albumin: 3.7 g/dL (ref 3.5–5.0)
Alkaline Phosphatase: 70 U/L (ref 38–126)
Anion gap: 19 — ABNORMAL HIGH (ref 5–15)
BUN: 9 mg/dL (ref 8–23)
CO2: 12 mmol/L — ABNORMAL LOW (ref 22–32)
Calcium: 9.3 mg/dL (ref 8.9–10.3)
Chloride: 108 mmol/L (ref 98–111)
Creatinine, Ser: 0.75 mg/dL (ref 0.44–1.00)
GFR, Estimated: >60 mL/min (ref >60)
Glucose, Bld: 111 mg/dL — ABNORMAL HIGH (ref 70–99)
Potassium: 3.5 mmol/L (ref 3.5–5.1)
Sodium: 139 mmol/L (ref 135–145)
Total Bilirubin: 0.6 mg/dL (ref 0.3–1.2)
Total Protein: 7.2 g/dL (ref 6.5–8.1)

## 2022-04-24 LAB — URINALYSIS, ROUTINE W REFLEX MICROSCOPIC
Bilirubin Urine: NEGATIVE
Glucose, UA: NEGATIVE mg/dL
Ketones, ur: NEGATIVE mg/dL
Nitrite: POSITIVE — AB
Protein, ur: NEGATIVE mg/dL
Specific Gravity, Urine: 1.004 — ABNORMAL LOW (ref 1.005–1.030)
WBC, UA: >50 WBC/hpf — ABNORMAL HIGH (ref 0–5)
pH: 8 (ref 5.0–8.0)

## 2022-04-24 LAB — LACTIC ACID, PLASMA
Lactic Acid, Venous: 0.9 mmol/L (ref 0.5–1.9)
Lactic Acid, Venous: 0.9 mmol/L (ref 0.5–1.9)

## 2022-04-24 LAB — LIPASE, BLOOD: Lipase: 29 U/L (ref 11–51)

## 2022-04-24 MED ORDER — ALPRAZOLAM 0.25 MG PO TABS
0.1250 mg | ORAL_TABLET | Freq: Every day | ORAL | Status: DC | PRN
  Administered 2022-04-24 – 2022-04-25 (×2): 0.25 mg via ORAL
  Filled 2022-04-24 (×2): qty 1

## 2022-04-24 MED ORDER — BIOTIN 1000 MCG PO TABS: 1000.0000 ug | ORAL_TABLET | Freq: Every day | ORAL | Status: DC

## 2022-04-24 MED ORDER — ACETAMINOPHEN 650 MG RE SUPP: 650.0000 mg | Freq: Four times a day (QID) | RECTAL | Status: DC | PRN

## 2022-04-24 MED ORDER — SODIUM CHLORIDE 0.9 % IV SOLN: INTRAVENOUS | Status: DC

## 2022-04-24 MED ORDER — MELATONIN 3 MG PO TABS
6.0000 mg | ORAL_TABLET | Freq: Every day | ORAL | Status: DC
  Administered 2022-04-24 – 2022-04-25 (×2): 6 mg via ORAL
  Filled 2022-04-24 (×4): qty 2

## 2022-04-24 MED ORDER — ESCITALOPRAM OXALATE 10 MG PO TABS
5.0000 mg | ORAL_TABLET | Freq: Every day | ORAL | Status: DC
  Administered 2022-04-25 – 2022-04-26 (×2): 5 mg via ORAL
  Filled 2022-04-24 (×3): qty 1

## 2022-04-24 MED ORDER — SODIUM CHLORIDE 0.9 % IV SOLN
1.0000 g | Freq: Once | INTRAVENOUS | Status: AC
  Administered 2022-04-24: 1 g via INTRAVENOUS
  Filled 2022-04-24: qty 10

## 2022-04-24 MED ORDER — ALPRAZOLAM 0.5 MG PO TABS
0.2500 mg | ORAL_TABLET | Freq: Once | ORAL | Status: AC
  Administered 2022-04-24: 0.25 mg via ORAL
  Filled 2022-04-24: qty 1

## 2022-04-24 MED ORDER — ENSURE ENLIVE PO LIQD
237.0000 mL | Freq: Two times a day (BID) | ORAL | Status: DC
  Administered 2022-04-25 – 2022-04-26 (×3): 237 mL via ORAL
  Filled 2022-04-24 (×2): qty 237

## 2022-04-24 MED ORDER — IRBESARTAN 150 MG PO TABS
75.0000 mg | ORAL_TABLET | Freq: Every day | ORAL | Status: DC
  Filled 2022-04-24: qty 1

## 2022-04-24 MED ORDER — ENOXAPARIN SODIUM 40 MG/0.4ML IJ SOSY
40.0000 mg | PREFILLED_SYRINGE | INTRAMUSCULAR | Status: DC
  Administered 2022-04-24 – 2022-04-25 (×2): 40 mg via SUBCUTANEOUS
  Filled 2022-04-24 (×2): qty 0.4

## 2022-04-24 MED ORDER — ONDANSETRON HCL 4 MG PO TABS: 4.0000 mg | ORAL_TABLET | Freq: Four times a day (QID) | ORAL | Status: DC | PRN

## 2022-04-24 MED ORDER — ACETAMINOPHEN 325 MG PO TABS: 650.0000 mg | ORAL_TABLET | Freq: Four times a day (QID) | ORAL | Status: DC | PRN

## 2022-04-24 MED ORDER — THIAMINE MONONITRATE 100 MG PO TABS
100.0000 mg | ORAL_TABLET | Freq: Every day | ORAL | Status: DC
  Administered 2022-04-24 – 2022-04-26 (×3): 100 mg via ORAL
  Filled 2022-04-24 (×3): qty 1

## 2022-04-24 MED ORDER — CALCIUM CARBONATE 1250 (500 CA) MG PO TABS
1250.0000 mg | ORAL_TABLET | Freq: Every day | ORAL | Status: DC
  Administered 2022-04-25 – 2022-04-26 (×2): 1250 mg via ORAL
  Filled 2022-04-24 (×2): qty 1

## 2022-04-24 MED ORDER — KCL IN DEXTROSE-NACL 20-5-0.45 MEQ/L-%-% IV SOLN
INTRAVENOUS | Status: AC
  Filled 2022-04-24 (×3): qty 1000

## 2022-04-24 MED ORDER — POTASSIUM CHLORIDE CRYS ER 20 MEQ PO TBCR
40.0000 meq | EXTENDED_RELEASE_TABLET | Freq: Once | ORAL | Status: AC
  Administered 2022-04-24: 40 meq via ORAL
  Filled 2022-04-24: qty 2

## 2022-04-24 MED ORDER — FAMOTIDINE 20 MG PO TABS
20.0000 mg | ORAL_TABLET | Freq: Once | ORAL | Status: AC
  Administered 2022-04-24: 20 mg via ORAL
  Filled 2022-04-24: qty 1

## 2022-04-24 MED ORDER — ONDANSETRON HCL 4 MG/2ML IJ SOLN
4.0000 mg | Freq: Once | INTRAMUSCULAR | Status: AC
  Administered 2022-04-24: 4 mg via INTRAVENOUS
  Filled 2022-04-24: qty 2

## 2022-04-24 MED ORDER — PANTOPRAZOLE SODIUM 40 MG PO TBEC
40.0000 mg | DELAYED_RELEASE_TABLET | Freq: Every day | ORAL | Status: DC
  Administered 2022-04-24 – 2022-04-26 (×3): 40 mg via ORAL
  Filled 2022-04-24 (×3): qty 1

## 2022-04-24 MED ORDER — ONDANSETRON HCL 4 MG/2ML IJ SOLN: 4.0000 mg | Freq: Four times a day (QID) | INTRAMUSCULAR | Status: DC | PRN

## 2022-04-24 MED ORDER — DEXTROSE-NACL 5-0.45 % IV SOLN: INTRAVENOUS | Status: DC

## 2022-04-24 MED ORDER — LACTATED RINGERS IV BOLUS
1000.0000 mL | Freq: Once | INTRAVENOUS | Status: AC
Start: 2022-04-24 — End: 2022-04-24
  Administered 2022-04-24: 1000 mL via INTRAVENOUS

## 2022-04-24 MED ORDER — SODIUM CHLORIDE 0.9 % IV SOLN
1.0000 g | INTRAVENOUS | Status: DC
  Administered 2022-04-25 – 2022-04-26 (×2): 1 g via INTRAVENOUS
  Filled 2022-04-24 (×2): qty 10

## 2022-04-24 MED ORDER — IOHEXOL 300 MG/ML  SOLN
100.0000 mL | Freq: Once | INTRAMUSCULAR | Status: AC | PRN
  Administered 2022-04-24: 100 mL via INTRAVENOUS

## 2022-04-24 MED ORDER — VITAMIN B-12 1000 MCG PO TABS
500.0000 ug | ORAL_TABLET | Freq: Every day | ORAL | Status: DC
  Administered 2022-04-24 – 2022-04-26 (×3): 500 ug via ORAL
  Filled 2022-04-24 (×3): qty 1

## 2022-04-24 NOTE — H&P (Addendum)
History and Physical    Patient: Diana Smith DOB: 1945-11-16 DOA: 04/24/2022 DOS: the patient was seen and examined on 04/24/2022 PCP: Jonathon Jordan, MD  Patient coming from: Home  Chief Complaint:  Chief Complaint  Patient presents with   Abdominal Pain   HPI: Diana Smith is a 76 y.o. female with medical history significant of anxiety, GERD, Alzheimer's (earlier stage) who presents to the emergency department due to about 2 weeks of intermittent confusion, anxiety and loose bowel movement (about 3-4 episodes within the 2-week period).  She went to her neurologist yesterday and donepezil was held for about a week to ensure that it was not the cause of patient's symptoms.  BuSpar was changed for Lexapro (per ED physician).  Patient complained of right flank pain which resolved after taking some aspirin a few days ago.  Husband at bedside states that patient has not been eating or drinking more than a few sips on daily basis for almost 2 weeks.  She complained of diffuse abdominal pain which started this morning, pain was sharp and crampy in nature, she also reported several days of frequent urination of which she was only able to urinate little amount of urine compared to her baseline.  She denies burning sensation on urination, chest pain, shortness of breath, fever, chills, nausea, vomiting.  ED Course:  In the emergency department, she was hemodynamically stable, though pulse was 85 bpm, but other vital signs were within normal range.  Work-up in the ED showed normal CBC and BMP except for bicarb of 12 and blood glucose of 111 and anion gap of 19.  Lipase 29, urinalysis was positive for UTI. CT abdomen and pelvis with contrast showed no acute findings in the abdomen and pelvis She was treated with IV ceftriaxone, Pepcid, Xanax and Zofran were given.  Patient was provided with IV hydration.  Hospitalist was asked to admit patient for further evaluation and  management.  Review of Systems: Review of systems as noted in the HPI. All other systems reviewed and are negative.   Past Medical History:  Diagnosis Date   Actinic keratosis    Allergic rhinitis due to pollen    Cerebrovascular disease    Frequent urinary tract infections    Gastro-esophageal reflux disease without esophagitis    Generalized anxiety disorder    Heartburn    Hematochezia    History of COVID-19    Hyperglycemia    Hyperlipidemia    Major depressive disorder    Mild neurocognitive disorder, unclear etiology 12/75/1700   Nonalcoholic steatohepatitis (NASH)    Osteoarthritis of right knee 01/25/2021   Overactive bladder    Personal history of colonic polyps    Prediabetes    Pure hypercholesterolemia    Right bundle branch block    Rosacea    Sciatica, right side    Slow transit constipation    Vitamin B12 deficiency (non anemic)    Vitamin D deficiency    Past Surgical History:  Procedure Laterality Date   HYSTEROTOMY     MASS EXCISION  10/08/2011   Procedure: MINOR EXCISION OF MASS;  Surgeon: Cammie Sickle., MD;  Location: [facility];  Service: Orthopedics;  Laterality: Left;  excisional biopsy dorsum left long finger MCP joint    Social History:  reports that she has never smoked. She has never used smokeless tobacco. She reports that she does not drink alcohol and does not use drugs.   Allergies  Allergen Reactions  Dynacin [Minocycline Hcl]    Lorabid [Loracarbef]    Minocycline Other (See Comments)    Family History  Problem Relation Age of Onset   Dementia Father    Memory loss Father    Breast cancer Neg Hx       Prior to Admission medications   Medication Sig Start Date End Date Taking? Authorizing Provider  ALPRAZolam (XANAX) 0.25 MG tablet Take 0.25 mg by mouth See admin instructions. Take 1/2 tablet by mouth daily if needed take 1 tablet as needed for anxiety 12/23/20  Yes [provider]  Biotin 1000  MCG tablet 1 tablet    [provider]  calcium carbonate (OSCAL) 1500 (600 Ca) MG TABS tablet Take by mouth 2 (two) times daily with a meal.    [provider]  Melatonin 10 MG CAPS Take by mouth.    [provider]  omeprazole (PRILOSEC) 20 MG capsule Take 20 mg by mouth daily.    [provider]  PARoxetine (PAXIL) 30 MG tablet Take 40 mg by mouth every morning.    [provider]  vitamin B-12 (CYANOCOBALAMIN) 500 MCG tablet Take 500 mcg by mouth daily.    [provider]    Physical Exam: BP (!) 142/64 (BP Location: Left Arm)   Pulse 61   Temp 98.2 F (36.8 C) (Oral)   Resp 19   Ht 5' 6"  (1.676 m)   Wt 74 kg   SpO2 99%   BMI 26.33 kg/m   General: 76 y.o. year-old female ill appearing, but in no acute distress.  Alert and oriented x3. HEENT: NCAT, EOMI, dry mucous membrane Neck: Supple, trachea medial Cardiovascular: Bradycardia.  Regular rate and rhythm with no rubs or gallops.  No thyromegaly or JVD noted.  No lower extremity edema. 2/4 pulses in all 4 extremities. Respiratory: Clear to auscultation with no wheezes or rales. Good inspiratory effort. Abdomen: Soft, tender to palpation of abdomen.  Nondistended with normal bowel sounds x4 quadrants. Muskuloskeletal: No cyanosis, clubbing or edema noted bilaterally Neuro: CN II-XII intact, strength 5/5 x 4, sensation, reflexes intact Skin: No ulcerative lesions noted or rashes Psychiatry: Mood is appropriate for condition and setting          Labs on Admission:  Basic Metabolic Panel: Recent Labs  Lab 04/24/22 1256  NA 139  K 3.5  CL 108  CO2 12*  GLUCOSE 111*  BUN 9  CREATININE 0.75  CALCIUM 9.3   Liver Function Tests: Recent Labs  Lab 04/24/22 1256  AST 18  ALT 15  ALKPHOS 70  BILITOT 0.6  PROT 7.2  ALBUMIN 3.7   Recent Labs  Lab 04/24/22 1256  LIPASE 29   No results for input(s): "AMMONIA" in the last 168 hours. CBC: Recent Labs  Lab  04/24/22 1256  WBC 7.5  NEUTROABS 4.9  HGB 13.7  HCT 39.4  MCV 94.0  PLT 261   Cardiac Enzymes: No results for input(s): "CKTOTAL", "CKMB", "CKMBINDEX", "TROPONINI" in the last 168 hours.  BNP (last 3 results) No results for input(s): "BNP" in the last 8760 hours.  ProBNP (last 3 results) No results for input(s): "PROBNP" in the last 8760 hours.  CBG: No results for input(s): "GLUCAP" in the last 168 hours.  Radiological Exams on Admission: CT ABDOMEN PELVIS W CONTRAST  Result Date: 04/24/2022 CLINICAL DATA:  Acute abdominal pain. EXAM: CT ABDOMEN AND PELVIS WITH CONTRAST TECHNIQUE: Multidetector CT imaging of the abdomen and pelvis was performed using the  standard protocol following bolus administration of intravenous contrast. RADIATION DOSE REDUCTION: This exam was performed according to the departmental dose-optimization program which includes automated exposure control, adjustment of the mA and/or kV according to patient size and/or use of iterative reconstruction technique. CONTRAST:  160m OMNIPAQUE IOHEXOL 300 MG/ML  SOLN COMPARISON:  CT abdomen pelvis dated 05/24/2005. FINDINGS: Lower chest: A 4 mm solid pulmonary nodule seen in the medial aspect of the left lower lobe (series 4, image 14). Hepatobiliary: No focal liver abnormality is seen. No gallstones, gallbladder wall thickening, or biliary dilatation. Pancreas: Unremarkable. No pancreatic ductal dilatation or surrounding inflammatory changes. Spleen: Normal in size without focal abnormality. Adrenals/Urinary Tract: Adrenal glands are unremarkable. A 4.5 cm cyst in the inferior pole left kidney is benign and no imaging follow-up is recommended for this finding. Otherwise, the kidneys are normal, without renal calculi, focal lesion, or hydronephrosis. Bladder is unremarkable. Stomach/Bowel: Stomach is within normal limits. The appendix appears to be absent. No evidence of bowel wall thickening, distention, or inflammatory  changes. Vascular/Lymphatic: Aortic atherosclerosis. No enlarged abdominal or pelvic lymph nodes. Reproductive: Status post hysterectomy. No adnexal masses. Other: No abdominal wall hernia or abnormality. No abdominopelvic ascites. Musculoskeletal: Degenerative changes are seen in the spine. IMPRESSION: 1. No acute findings in the abdomen or pelvis. 2. Solid pulmonary nodule measuring 4 mm in the left lower lobe. Although likely benign, if the patient is high-risk, given the morphology and/or location of this nodule a non-contrast chest CT can be considered in 12 months.This recommendation follows the consensus statement: Guidelines for Management of Incidental Pulmonary Nodules Detected on CT Images: From the Fleischner Society 2017; Radiology 2017; 284:228-243. Aortic Atherosclerosis (ICD10-I70.0). Electronically Signed   By: TZerita BoersM.D.   On: 04/24/2022 14:25    EKG: I independently viewed the EKG done and my findings are as followed: Sinus rhythm at a rate of 56 bpm with RBBB  Assessment/Plan Present on Admission:  UTI (urinary tract infection)  Gastro-esophageal reflux disease without esophagitis  Major depressive disorder  Principal Problem:   UTI (urinary tract infection) Active Problems:   Gastro-esophageal reflux disease without esophagitis   Major depressive disorder   Abdominal pain   Starvation ketoacidosis   Hypoalbuminemia due to protein-calorie malnutrition ([place]   Pulmonary nodule   Essential hypertension   Anxiety   Insomnia   UTI POA Patient was started with IV ceftriaxone; we shall continue same at this time Urine culture pending  Abdominal pain Pepcid was given, continue with Protonix Continue IV Zofran Continue clear liquid diet with plan to advance diet as tolerated  Starvation ketoacidosis HAGMA  Bicarb 12, anion gap 19 Continue D5 half-normal saline Continue serial BMPs to ensure improvement in bicarb and anion gap closure  Reported altered mental  status-improved Patient was alert and oriented x 3  Hypoalbuminemia possibly secondary to mild protein calorie malnutrition Albumin 3.7, protein supplement to be provided  Incidental finding of pulmonary nodule CT abdomen and pelvis with contrast showed solid pulmonary nodule measuring 4 mm in the left lower lobe. Although likely benign, if the patient is high-risk, given the morphology and/or location of this nodule a non-contrast chest CT can be considered in 12 months per radiology recommendation  GERD Continue Protonix  Essential hypertension BP made was refused due to patient not taking any BP meds at home per husband  Anxiety and depression Continue Xanax and Lexapro  Insomnia Continue melatonin  Other home meds: Proscar, vitamin B12, Biotin  DVT prophylaxis: Lovenox  Code Status: Full code  Consults: None  Family Communication: Husband at bedside (all questions answered to satisfaction)  Severity of Illness: The appropriate patient status for this patient is INPATIENT. Inpatient status is judged to be reasonable and necessary in order to provide the required intensity of service to ensure the patient's safety. The patient's presenting symptoms, physical exam findings, and initial radiographic and laboratory data in the context of their chronic comorbidities is felt to place them at high risk for further clinical deterioration. Furthermore, it is not anticipated that the patient will be medically stable for discharge from the hospital within 2 midnights of admission.   * I certify that at the point of admission it is my clinical judgment that the patient will require inpatient hospital care spanning beyond 2 midnights from the point of admission due to high intensity of service, high risk for further deterioration and high frequency of surveillance required.*  Author: Bernadette Hoit, DO 04/24/2022 5:14 PM  For on call review [email or web address].

## 2022-04-24 NOTE — ED Triage Notes (Signed)
Pt c/o upper abd pain with constipation and loss of appetite. Pt was recently started on a new medication for dementia but quit taking it about a week ago due to diarrhea and nervousness.

## 2022-04-24 NOTE — ED Provider Notes (Signed)
[facility] EMERGENCY DEPARTMENT Provider Note   CSN: [id number] Arrival date & time: 04/24/22  1235     History Chief Complaint  Patient presents with   Abdominal Pain    HPI Diana Smith is a 76 y.o. female presenting for abdominal pain, poor p.o. intake over the past week.  Per her husband, she has undergone multiple medication changes in the last week including cessation of donepezil, exchange of BuSpar for Lexapro.  He has not been able to get her to eat and drink more than a few sips per day.  She has been going to the bathroom frequently. They deny fevers or chills, nausea vomiting causing shortness of breath.  Patient otherwise recently diagnosed with cognitive impairment..   Patient's recorded medical, surgical, social, medication list and allergies were reviewed in the Snapshot window as part of the initial history.   Review of Systems   Review of Systems  Constitutional:  Negative for chills and fever.  HENT:  Negative for ear pain and sore throat.   Eyes:  Negative for pain and visual disturbance.  Respiratory:  Negative for cough and shortness of breath.   Cardiovascular:  Negative for chest pain and palpitations.  Gastrointestinal:  Negative for abdominal pain and vomiting.  Genitourinary:  Positive for frequency. Negative for dysuria and hematuria.  Musculoskeletal:  Negative for arthralgias and back pain.  Skin:  Negative for color change and rash.  Neurological:  Negative for seizures and syncope.  Psychiatric/Behavioral:  Positive for agitation and confusion.   All other systems reviewed and are negative.   Physical Exam Updated Vital Signs BP 134/72   Pulse (!) 55   Temp 97.6 F (36.4 C)   Resp 19   Ht 5' 6"  (1.676 m)   Wt 76.2 kg   SpO2 96%   BMI 27.12 kg/m  Physical Exam Vitals and nursing note reviewed.  Constitutional:      General: She is not in acute distress.    Appearance: She is well-developed.  HENT:     Head: Normocephalic and  atraumatic.  Eyes:     Conjunctiva/sclera: Conjunctivae normal.  Cardiovascular:     Rate and Rhythm: Normal rate and regular rhythm.     Heart sounds: No murmur heard. Pulmonary:     Effort: Pulmonary effort is normal. No respiratory distress.     Breath sounds: Normal breath sounds.  Abdominal:     General: There is no distension.     Palpations: Abdomen is soft.     Tenderness: There is abdominal tenderness. There is no right CVA tenderness, left CVA tenderness or guarding.  Musculoskeletal:        General: No swelling or tenderness. Normal range of motion.     Cervical back: Neck supple.  Skin:    General: Skin is warm and dry.     Capillary Refill: Capillary refill takes less than 2 seconds.  Neurological:     General: No focal deficit present.     Mental Status: She is alert. Mental status is at baseline. She is disoriented.     Cranial Nerves: No cranial nerve deficit.  Psychiatric:        Mood and Affect: Mood normal.      ED Course/ Medical Decision Making/ A&P Clinical Course as of 04/24/22 1521  Sat Apr 24, 2022  1444 Albumin: 3.7 [CC]    Clinical Course User Index [CC] Tretha Sciara, MD    Procedures Procedures   Medications Ordered in ED  Medications  cefTRIAXone (ROCEPHIN) 1 g in sodium chloride 0.9 % 100 mL IVPB (1 g Intravenous New Bag/Given 04/24/22 1459)  lactated ringers bolus 1,000 mL (1,000 mLs Intravenous Bolus 04/24/22 1310)  ondansetron (ZOFRAN) injection 4 mg (4 mg Intravenous Given 04/24/22 1309)  famotidine (PEPCID) tablet 20 mg (20 mg Oral Given 04/24/22 1313)  iohexol (OMNIPAQUE) 300 MG/ML solution 100 mL (100 mLs Intravenous Contrast Given 04/24/22 1403)  ALPRAZolam (XANAX) tablet 0.25 mg (0.25 mg Oral Given 04/24/22 1457)   Medical Decision Making:   Diana Smith is a 76 y.o. female who presented to the ED today with abdominal pain, detailed above.    Additional history discussed with patient's family/caregivers.  Patient's  presentation is complicated by their history of advanced age, multiple comorbid medical conditions, chronic outpatient medication regimen including psychiatric medications.  Patient placed on continuous vitals and telemetry monitoring while in ED which was reviewed periodically.  Complete initial physical exam performed, notably the patient  was hemodynamically stable in no acute distress.  GCS 14.     Reviewed and confirmed nursing documentation for past medical history, family history, social history.    Initial Assessment:   With the patient's presentation of abdominal pain, most likely diagnosis is nonspecific etiology. Other diagnoses were considered including (but not limited to) gastroenteritis, colitis, small bowel obstruction, appendicitis, cholecystitis, pancreatitis, nephrolithiasis, UTI, pyleonephritis. These are considered less likely due to history of present illness and physical exam findings.   This is most consistent with an acute life/limb threatening illness complicated by underlying chronic conditions.   Initial Plan:  CBC/CMP to evaluate for underlying infectious/metabolic etiology for patient's abdominal pain  Lipase to evaluate for pancreatitis  CTAB/Pelvis with contrast to evaluate for structural/surgical etiology of patients' severe abdominal pain.  Urinalysis and repeat physical assessment to evaluate for UTI/Pyelonpehritis  Empiric management of symptoms with escalating pain control and antiemetics as needed.   Initial Study Results:   Laboratory  All laboratory results reviewed without evidence of clinically relevant pathology.   Exceptions include: Multiple abnormalities on urinalysis including nitrate positive, bacteriuria    Radiology All images reviewed independently. Agree with radiology report at this time.   CT ABDOMEN PELVIS W CONTRAST  Result Date: 04/24/2022 CLINICAL DATA:  Acute abdominal pain. EXAM: CT ABDOMEN AND PELVIS WITH CONTRAST TECHNIQUE:  Multidetector CT imaging of the abdomen and pelvis was performed using the standard protocol following bolus administration of intravenous contrast. RADIATION DOSE REDUCTION: This exam was performed according to the departmental dose-optimization program which includes automated exposure control, adjustment of the mA and/or kV according to patient size and/or use of iterative reconstruction technique. CONTRAST:  140m OMNIPAQUE IOHEXOL 300 MG/ML  SOLN COMPARISON:  CT abdomen pelvis dated 05/24/2005. FINDINGS: Lower chest: A 4 mm solid pulmonary nodule seen in the medial aspect of the left lower lobe (series 4, image 14). Hepatobiliary: No focal liver abnormality is seen. No gallstones, gallbladder wall thickening, or biliary dilatation. Pancreas: Unremarkable. No pancreatic ductal dilatation or surrounding inflammatory changes. Spleen: Normal in size without focal abnormality. Adrenals/Urinary Tract: Adrenal glands are unremarkable. A 4.5 cm cyst in the inferior pole left kidney is benign and no imaging follow-up is recommended for this finding. Otherwise, the kidneys are normal, without renal calculi, focal lesion, or hydronephrosis. Bladder is unremarkable. Stomach/Bowel: Stomach is within normal limits. The appendix appears to be absent. No evidence of bowel wall thickening, distention, or inflammatory changes. Vascular/Lymphatic: Aortic atherosclerosis. No enlarged abdominal or pelvic lymph nodes. Reproductive: Status post  hysterectomy. No adnexal masses. Other: No abdominal wall hernia or abnormality. No abdominopelvic ascites. Musculoskeletal: Degenerative changes are seen in the spine. IMPRESSION: 1. No acute findings in the abdomen or pelvis. 2. Solid pulmonary nodule measuring 4 mm in the left lower lobe. Although likely benign, if the patient is high-risk, given the morphology and/or location of this nodule a non-contrast chest CT can be considered in 12 months.This recommendation follows the consensus  statement: Guidelines for Management of Incidental Pulmonary Nodules Detected on CT Images: From the Fleischner Society 2017; Radiology 2017; 284:228-243. Aortic Atherosclerosis (ICD10-I70.0). Electronically Signed   By: Zerita Boers M.D.   On: 04/24/2022 14:25   ECHOCARDIOGRAM COMPLETE  Result Date: 04/14/2022    ECHOCARDIOGRAM REPORT   Patient Name:   Diana Smith Date of Exam: 04/14/2022 Medical Rec #:  [id number]        Height:       65.0 in Accession #:    [id number]       Weight:       172.0 lb Date of Birth:  Jun 27, 1946        BSA:          1.855 m Patient Age:    87 years         BP:           156/72 mmHg Patient Gender: F                HR:           51 bpm. Exam Location:  [address] Procedure: 2D Echo, Cardiac Doppler and Color Doppler Indications:    I10 Hypertension  History:        Patient has no prior history of Echocardiogram examinations.                 Arrythmias:RBBB; Risk Factors:Dyslipidemia. Prediabetes.                 Cerebrovascular disease.  Sonographer:    Diamond Nickel RCS Referring Phys: [place]  1. Left ventricular ejection fraction, by estimation, is 55 to 60%. The left ventricle has normal function. The left ventricle has no regional wall motion abnormalities. There is mild concentric left ventricular hypertrophy. Left ventricular diastolic parameters were normal.  2. Right ventricular systolic function is normal. The right ventricular size is normal.  3. The mitral valve is normal in structure. Trivial mitral valve regurgitation.  4. The aortic valve is tricuspid. Aortic valve regurgitation is mild.  5. The inferior vena cava is normal in size with greater than 50% respiratory variability, suggesting right atrial pressure of 3 mmHg. FINDINGS  Left Ventricle: Left ventricular ejection fraction, by estimation, is 55 to 60%. The left ventricle has normal function. The left ventricle has no regional wall motion abnormalities. The left ventricular  internal cavity size was normal in size. There is  mild concentric left ventricular hypertrophy. Left ventricular diastolic parameters were normal. Right Ventricle: The right ventricular size is normal. Right vetricular wall thickness was not assessed. Right ventricular systolic function is normal. Left Atrium: Left atrial size was normal in size. Right Atrium: Right atrial size was normal in size. Pericardium: There is no evidence of pericardial effusion. Mitral Valve: The mitral valve is normal in structure. Trivial mitral valve regurgitation. Tricuspid Valve: The tricuspid valve is normal in structure. Tricuspid valve regurgitation is trivial. Aortic Valve: The aortic valve is tricuspid. Aortic valve regurgitation is mild. Aortic regurgitation PHT measures 732 msec. Pulmonic  Valve: The pulmonic valve was normal in structure. Pulmonic valve regurgitation is not visualized. Aorta: The aortic root and ascending aorta are structurally normal, with no evidence of dilitation. Venous: The inferior vena cava is normal in size with greater than 50% respiratory variability, suggesting right atrial pressure of 3 mmHg. IAS/Shunts: No atrial level shunt detected by color flow Doppler.  LEFT VENTRICLE PLAX 2D LVIDd:         5.10 cm   Diastology LVIDs:         3.40 cm   LV e' medial:    7.18 cm/s LV PW:         1.20 cm   LV E/e' medial:  5.8 LV IVS:        1.20 cm   LV e' lateral:   4.65 cm/s LVOT diam:     2.10 cm   LV E/e' lateral: 9.0 LV SV:         89 LV SV Index:   48 LVOT Area:     3.46 cm  RIGHT VENTRICLE RV Basal diam:  2.00 cm RV Mid diam:    1.40 cm RV S prime:     10.40 cm/s TAPSE (M-mode): 1.6 cm LEFT ATRIUM             Index        RIGHT ATRIUM           Index LA diam:        4.10 cm 2.21 cm/m   RA Area:     12.30 cm LA Vol (A2C):   30.3 ml 16.33 ml/m  RA Volume:   28.10 ml  15.15 ml/m LA Vol (A4C):   21.2 ml 11.43 ml/m LA Biplane Vol: 25.3 ml 13.64 ml/m  AORTIC VALVE LVOT Vmax:   114.00 cm/s LVOT Vmean:   63.900 cm/s LVOT VTI:    0.256 m AI PHT:      732 msec  AORTA Ao Root diam: 3.20 cm Ao Asc diam:  3.60 cm MITRAL VALVE MV Area (PHT): 1.53 cm    SHUNTS MV Decel Time: 496 msec    Systemic VTI:  0.26 m MV E velocity: 41.90 cm/s  Systemic Diam: 2.10 cm MV A velocity: 77.00 cm/s MV E/A ratio:  0.54 Dorris Carnes MD Electronically signed by Dorris Carnes MD Signature Date/Time: 04/14/2022/2:22:45 PM    Final      Consults: Case discussed with hospitalist.   Final Reassessment and Plan:   Repeat assessment, patient is mildly improved after administration of Pepcid, may be some underlying gastroesophageal reflux.  Patient willing to attempt eating.  However patient still delirious per patient's husband she is not back at her baseline at this time.  With findings of urinary tract infection and ongoing delirium, patient is likely to require inpatient care and management after conversation with husband who does not feel comfortable with outpatient care at this time.  We will start patient on ceftriaxone and admit patient after discussion with hospitalist.  Patient admitted ready to move at this time.     Clinical Impression:  1. Generalized abdominal pain   2. Lower urinary tract infectious disease      Admit   Final Clinical Impression(s) / ED Diagnoses Final diagnoses:  Generalized abdominal pain  Lower urinary tract infectious disease    Rx / DC Orders ED Discharge Orders     None         Tretha Sciara, MD 04/25/22 [id number]

## 2022-04-24 NOTE — ED Notes (Signed)
Patient transported to CT 

## 2022-04-25 DIAGNOSIS — R1084 Generalized abdominal pain: Secondary | ICD-10-CM | POA: Diagnosis not present

## 2022-04-25 DIAGNOSIS — N3000 Acute cystitis without hematuria: Secondary | ICD-10-CM | POA: Diagnosis not present

## 2022-04-25 DIAGNOSIS — I10 Essential (primary) hypertension: Secondary | ICD-10-CM | POA: Diagnosis not present

## 2022-04-25 DIAGNOSIS — N390 Urinary tract infection, site not specified: Secondary | ICD-10-CM

## 2022-04-25 DIAGNOSIS — F419 Anxiety disorder, unspecified: Secondary | ICD-10-CM | POA: Diagnosis not present

## 2022-04-25 DIAGNOSIS — F329 Major depressive disorder, single episode, unspecified: Secondary | ICD-10-CM

## 2022-04-25 LAB — BASIC METABOLIC PANEL
Anion gap: 5 (ref 5–15)
BUN: 5 mg/dL — ABNORMAL LOW (ref 8–23)
CO2: 24 mmol/L (ref 22–32)
Calcium: 8.5 mg/dL — ABNORMAL LOW (ref 8.9–10.3)
Chloride: 110 mmol/L (ref 98–111)
Creatinine, Ser: 0.7 mg/dL (ref 0.44–1.00)
GFR, Estimated: >60 mL/min (ref >60)
Glucose, Bld: 111 mg/dL — ABNORMAL HIGH (ref 70–99)
Potassium: 3.6 mmol/L (ref 3.5–5.1)
Sodium: 139 mmol/L (ref 135–145)

## 2022-04-25 LAB — COMPREHENSIVE METABOLIC PANEL
ALT: 12 U/L (ref 0–44)
AST: 14 U/L — ABNORMAL LOW (ref 15–41)
Albumin: 3.2 g/dL — ABNORMAL LOW (ref 3.5–5.0)
Alkaline Phosphatase: 63 U/L (ref 38–126)
Anion gap: 5 (ref 5–15)
BUN: 5 mg/dL — ABNORMAL LOW (ref 8–23)
CO2: 27 mmol/L (ref 22–32)
Calcium: 9 mg/dL (ref 8.9–10.3)
Chloride: 109 mmol/L (ref 98–111)
Creatinine, Ser: 0.67 mg/dL (ref 0.44–1.00)
GFR, Estimated: >60 mL/min (ref >60)
Glucose, Bld: 103 mg/dL — ABNORMAL HIGH (ref 70–99)
Potassium: 4 mmol/L (ref 3.5–5.1)
Sodium: 141 mmol/L (ref 135–145)
Total Bilirubin: 0.6 mg/dL (ref 0.3–1.2)
Total Protein: 5.9 g/dL — ABNORMAL LOW (ref 6.5–8.1)

## 2022-04-25 LAB — PHOSPHORUS: Phosphorus: 3.9 mg/dL (ref 2.5–4.6)

## 2022-04-25 LAB — CBC
HCT: 36.5 % (ref 36.0–46.0)
Hemoglobin: 12.1 g/dL (ref 12.0–15.0)
MCH: 32.4 pg (ref 26.0–34.0)
MCHC: 33.2 g/dL (ref 30.0–36.0)
MCV: 97.6 fL (ref 80.0–100.0)
Platelets: 241 10*3/uL (ref 150–400)
RBC: 3.74 MIL/uL — ABNORMAL LOW (ref 3.87–5.11)
RDW: 13.9 % (ref 11.5–15.5)
WBC: 8.1 10*3/uL (ref 4.0–10.5)
nRBC: 0 % (ref 0.0–0.2)

## 2022-04-25 LAB — APTT: aPTT: 34 seconds (ref 24–36)

## 2022-04-25 LAB — MAGNESIUM: Magnesium: 2.1 mg/dL (ref 1.7–2.4)

## 2022-04-25 MED ORDER — KCL IN DEXTROSE-NACL 20-5-0.45 MEQ/L-%-% IV SOLN: INTRAVENOUS | Status: AC

## 2022-04-25 NOTE — Progress Notes (Signed)
Pt consumed clear liquid diet just fine. No nausea, vomiting or pain noted. I believe it would be okay for MD to advance pts diet today. Slept well throughout the night.

## 2022-04-25 NOTE — Progress Notes (Signed)
Patient's spouse has expressed that he does not want the bed alarm activated for his wife, stated "I have never let her fall in 74 years!". Explained to patient's spouse that the bed alarm was part of a protocol for safety prevention from falls, but patient and patient's spouse still refused bed alarm. Made patient and spouse aware of risk and notified charge nurse of situation. Floor mats applied in room.

## 2022-04-25 NOTE — TOC Progression Note (Signed)
  Transition of Care (TOC) Screening Note   Patient Details  Name: Diana Smith Date of Birth: 1945/10/31   Transition of Care [facility]) CM/SW Contact:    Boneta Lucks, RN Phone Number: 04/25/2022, 2:15 PM    Transition of Care Department [facility]) has reviewed patient and no TOC needs have been identified at this time. We will continue to monitor patient advancement through interdisciplinary progression rounds. If new patient transition needs arise, please place a TOC consult.

## 2022-04-25 NOTE — Progress Notes (Signed)
Progress Note   Patient: Diana Smith HGD:[id number] DOB: 1946/04/04 DOA: 04/24/2022     1 DOS: the patient was seen and examined on 04/25/2022   Brief hospital course: As per H&P written by Dr. Josephine Cables on 04/24/2022 Diana Smith is a 76 y.o. female with medical history significant of anxiety, GERD, Alzheimer's (earlier stage) who presents to the emergency department due to about 2 weeks of intermittent confusion, anxiety and loose bowel movement (about 3-4 episodes within the 2-week period).  She went to her neurologist yesterday and donepezil was held for about a week to ensure that it was not the cause of patient's symptoms.  BuSpar was changed for Lexapro (per ED physician).  Patient complained of right flank pain which resolved after taking some aspirin a few days ago.  Husband at bedside states that patient has not been eating or drinking more than a few sips on daily basis for almost 2 weeks.  She complained of diffuse abdominal pain which started this morning, pain was sharp and crampy in nature, she also reported several days of frequent urination of which she was only able to urinate little amount of urine compared to her baseline.  She denies burning sensation on urination, chest pain, shortness of breath, fever, chills, nausea, vomiting.   ED Course:  In the emergency department, she was hemodynamically stable, though pulse was 85 bpm, but other vital signs were within normal range.  Work-up in the ED showed normal CBC and BMP except for bicarb of 12 and blood glucose of 111 and anion gap of 19.  Lipase 29, urinalysis was positive for UTI. CT abdomen and pelvis with contrast showed no acute findings in the abdomen and pelvis She was treated with IV ceftriaxone, Pepcid, Xanax and Zofran were given.  Patient was provided with IV hydration.  Hospitalist was asked to admit patient for further evaluation and management.  Assessment and Plan: 1-UTI -Continue to maintain adequate  hydration -Continue empirical use of ceftriaxone IV -Patient reported increased frequency and feeling nauseated. -Continue as needed antiemetics and follow clinical response.  2-abdominal pain/nausea/vomiting. -Suprapubic in nature; per husband at bedside extending toward his flanks. -Unrevealing CT abdomen and pelvis. -Continue to maintain adequate hydration and continue the use of antiemetics and antibiotic therapy for UTI.  3-starvation ketoacidosis/dehydration/high anion gap metabolic acidosis -Continue to maintain adequate hydration -Discussion about the importance of adequate nutrition and oral intake discussed with patient and husband at bedside -Repeat basic metabolic panel in a.m. -Follow anion gap.  4-altered mental status reported prior to admission -Most likely associated with UTI and dehydration -Patient with underlying history of dementia without behavioral disturbances -Continue patient follow-up with PCP/neurology -Continue treatment for UTI -Continue to maintain adequate hydration -Continue supportive care and constant reorientation.  5-address of his reflux disease -Continue PPI.  6-history of depression/anxiety -No suicidal ideation or hallucination -Continue as needed senna Continue the use of Lexapro.  7-essential hypertension -Continue to follow vital -Currently stable -Patient was not taking medications prior to admission -Heart healthy diet discussed with patient.   Subjective:  Stable and improving; still nauseated and having increased frequency.  No vomiting, no fever, no chest pain, no shortness of breath.  Physical Exam: Vitals:   04/24/22 1709 04/24/22 2106 04/25/22 0600 04/25/22 1443  BP: (!) 142/64 (!) 144/55 (!) 129/51 (!) 114/47  Pulse: 61 (!) 52 (!) 52 83  Resp:  20  17  Temp: 98.2 F (36.8 C) 98.3 F (36.8 C) 98 F (36.7 C)  98.4 F (36.9 C)  TempSrc: Oral Oral    SpO2: 99% 97% 100% 96%  Weight: 74 kg     Height: 5' 6"  (1.676  m)      General exam: Alert, awake, oriented x 2, following commands appropriately and in no acute distress.  No overnight events reported.  Still feeling slightly nauseated and expressing decreased appetite. Respiratory system: Clear to auscultation. Respiratory effort normal.  Good saturation on room air. Cardiovascular system:RRR. No rubs or gallops. Gastrointestinal system: Abdomen is nondistended, soft and nontender. No organomegaly or masses felt. Normal bowel sounds heard. Central nervous system: Alert and oriented. No focal neurological deficits. Extremities: No cyanosis or clubbing. Skin: No petechiae. Psychiatry: Flat affect/depressed mood appreciated on exam.  Data Reviewed: Basic metabolic panel: Sodium 432, potassium 3.6, chloride 110, bicarb 24, BUN 5, creatinine 0.70 CBC: WBCs 8.1, hemoglobin 12.1, platelets 241 K Magnesium: 2.1  Family Communication: Husband at bedside.  Disposition: Status is: Inpatient Remains inpatient appropriate because: Continue IV fluid resuscitation, IV antibiotics and treatment for high anion gap metabolic acidosis.   Planned Discharge Destination: Home  Time : 35 minutes  Author: Barton Dubois, MD 04/25/2022 4:51 PM  For on call review [email or web address].

## 2022-04-26 DIAGNOSIS — I10 Essential (primary) hypertension: Secondary | ICD-10-CM | POA: Diagnosis not present

## 2022-04-26 DIAGNOSIS — R911 Solitary pulmonary nodule: Secondary | ICD-10-CM | POA: Diagnosis not present

## 2022-04-26 DIAGNOSIS — E8729 Other acidosis: Secondary | ICD-10-CM | POA: Diagnosis not present

## 2022-04-26 DIAGNOSIS — G4700 Insomnia, unspecified: Secondary | ICD-10-CM | POA: Diagnosis not present

## 2022-04-26 DIAGNOSIS — T730XXA Starvation, initial encounter: Secondary | ICD-10-CM | POA: Diagnosis not present

## 2022-04-26 DIAGNOSIS — R69 Illness, unspecified: Secondary | ICD-10-CM | POA: Diagnosis not present

## 2022-04-26 DIAGNOSIS — K219 Gastro-esophageal reflux disease without esophagitis: Secondary | ICD-10-CM | POA: Diagnosis not present

## 2022-04-26 DIAGNOSIS — N3000 Acute cystitis without hematuria: Secondary | ICD-10-CM | POA: Diagnosis not present

## 2022-04-26 DIAGNOSIS — R1084 Generalized abdominal pain: Secondary | ICD-10-CM | POA: Diagnosis not present

## 2022-04-26 DIAGNOSIS — F419 Anxiety disorder, unspecified: Secondary | ICD-10-CM | POA: Diagnosis not present

## 2022-04-26 LAB — BASIC METABOLIC PANEL
Anion gap: 5 (ref 5–15)
BUN: 8 mg/dL (ref 8–23)
CO2: 25 mmol/L (ref 22–32)
Calcium: 8.8 mg/dL — ABNORMAL LOW (ref 8.9–10.3)
Chloride: 112 mmol/L — ABNORMAL HIGH (ref 98–111)
Creatinine, Ser: 0.73 mg/dL (ref 0.44–1.00)
GFR, Estimated: >60 mL/min (ref >60)
Glucose, Bld: 125 mg/dL — ABNORMAL HIGH (ref 70–99)
Potassium: 4 mmol/L (ref 3.5–5.1)
Sodium: 142 mmol/L (ref 135–145)

## 2022-04-26 MED ORDER — VALSARTAN 40 MG PO TABS: 40.0000 mg | ORAL_TABLET | Freq: Every day | ORAL | 1 refills | Status: DC

## 2022-04-26 MED ORDER — ONDANSETRON 8 MG PO TBDP: 8.0000 mg | ORAL_TABLET | Freq: Three times a day (TID) | ORAL | 0 refills | Status: DC | PRN

## 2022-04-26 MED ORDER — PAROXETINE HCL 20 MG PO TABS
40.0000 mg | ORAL_TABLET | ORAL | Status: DC
  Administered 2022-04-26: 40 mg via ORAL
  Filled 2022-04-26: qty 2

## 2022-04-26 MED ORDER — CEFDINIR 300 MG PO CAPS: 300.0000 mg | ORAL_CAPSULE | Freq: Two times a day (BID) | ORAL | 0 refills | Status: AC

## 2022-04-26 MED ORDER — OMEPRAZOLE 20 MG PO CPDR: 40.0000 mg | DELAYED_RELEASE_CAPSULE | Freq: Every day | ORAL | 2 refills | Status: DC

## 2022-04-26 MED ORDER — VITAMIN B-1 100 MG PO TABS: 100.0000 mg | ORAL_TABLET | Freq: Every day | ORAL | 1 refills | Status: DC

## 2022-04-26 NOTE — Care Management Important Message (Signed)
Important Message  Patient Details  Name: Diana Smith MRN: [id number] Date of Birth: [birth year]   Medicare Important Message Given:  Yes     Tommy Medal 04/26/2022, 10:46 AM

## 2022-04-26 NOTE — Progress Notes (Signed)
Pt's husband does not want bed alarm on. He states, "I take care of her and she doesn't need it." I educated pt and husband of reasons that bed alarm should be on but he still refused. Charge nurse is aware. Also upon morning assessment husband doesn't allow pt to answer any questions. When pt attempted to answer he held up his hand for her to stop talking and he answered instead. Husband also refuses NT help pt with anything and doesn't want them in the room for any reason. Pt appeared to be upset and nervous. Charge nurse is also aware. Will continue to monitor pt.

## 2022-04-26 NOTE — Discharge Summary (Signed)
Physician Discharge Summary   Patient: Diana Smith MRN: [id number] DOB: 06/20/1946  Admit date:     04/24/2022  Discharge date: 04/26/22  Discharge Physician: Barton Dubois   PCP: Jonathon Jordan, MD   Recommendations at discharge:  Repeat basic metabolic panel to follow electrolytes renal function Reassess blood pressure and adjust antihypertensive regimen as needed. Repeat UA/urine culture to assure complete resolution of infection. Repeat chest images as recommended by radiology department in 12 months.  Discharge Diagnoses: Principal Problem:   UTI (urinary tract infection) Active Problems:   Gastro-esophageal reflux disease without esophagitis   Major depressive disorder   Abdominal pain   Starvation ketoacidosis   Hypoalbuminemia due to protein-calorie malnutrition [facility])   Pulmonary nodule   Essential hypertension   Anxiety   Insomnia   Hospital Course: As per H&P written by Dr. Josephine Cables on 04/24/2022 Diana Smith is a 76 y.o. female with medical history significant of anxiety, GERD, Alzheimer's (earlier stage) who presents to the emergency department due to about 2 weeks of intermittent confusion, anxiety and loose bowel movement (about 3-4 episodes within the 2-week period).  She went to her neurologist yesterday and donepezil was held for about a week to ensure that it was not the cause of patient's symptoms.  BuSpar was changed for Lexapro (per ED physician).  Patient complained of right flank pain which resolved after taking some aspirin a few days ago.  Husband at bedside states that patient has not been eating or drinking more than a few sips on daily basis for almost 2 weeks.  She complained of diffuse abdominal pain which started this morning, pain was sharp and crampy in nature, she also reported several days of frequent urination of which she was only able to urinate little amount of urine compared to her baseline.  She denies burning sensation on urination,  chest pain, shortness of breath, fever, chills, nausea, vomiting.   ED Course:  In the emergency department, she was hemodynamically stable, though pulse was 85 bpm, but other vital signs were within normal range.  Work-up in the ED showed normal CBC and BMP except for bicarb of 12 and blood glucose of 111 and anion gap of 19.  Lipase 29, urinalysis was positive for UTI. CT abdomen and pelvis with contrast showed no acute findings in the abdomen and pelvis She was treated with IV ceftriaxone, Pepcid, Xanax and Zofran were given.  Patient was provided with IV hydration.  Hospitalist was asked to admit patient for further evaluation and management.  Assessment and Plan: 1-UTI -Continue to maintain adequate hydration. -Patient discharged on cefdinir to complete antibiotic therapy. -Patient reported increased frequency, but no frank dysuria or hematuria. -No nausea or vomiting reported at discharge.   2-abdominal pain/nausea/vomiting. -Suprapubic in nature; per husband at bedside extending toward his flanks. -Unrevealing CT abdomen and pelvis. -Continue to maintain adequate hydration and continue the use of antiemetics and antibiotic therapy for UTI.   3-starvation ketoacidosis/dehydration/high anion gap metabolic acidosis -Continue to maintain adequate hydration -Discussion about the importance of adequate nutrition and oral intake discussed with patient and husband at bedside -Repeat basic metabolic panel in a.m. -Anion gap within normal limits at time of discharge.   4-altered mental status reported prior to admission -Most likely associated with UTI and dehydration -Patient with underlying history of dementia without behavioral disturbances -Continue outpatient follow-up with PCP/neurology -Continue treatment for UTI -Continue to maintain adequate hydration -Continue supportive care and constant reorientation.   5-gastroesophageal reflux disease -Continue  PPI.   6-history of  depression/anxiety -No suicidal ideation or hallucination -Continue as needed senna -Continue the use of Lexapro.   7-essential hypertension -Continue to follow vital signs -Stable, but rising. -Heart healthy/low-sodium diet discussed with patient -Adjusted o half dose antihypertensive agents prescribed at discharge.     8-lung nodule -Incidental lung nodule measuring 4 mm in the left lower lobe; benign appearance.  Given patient's low risk history radiology recommending CT chest in 12 months for follow-up.  Consultants: None Procedures performed: See below for x-ray reports. Disposition: Home Diet recommendation: Heart healthy/low-sodium diet.  DISCHARGE MEDICATION: Allergies as of 04/26/2022       Reactions   Dynacin [minocycline Hcl]    Lorabid [loracarbef]    Minocycline Other (See Comments)        Medication List     STOP taking these medications    ibuprofen 600 MG tablet Commonly known as: ADVIL       TAKE these medications    ALPRAZolam 0.25 MG tablet Commonly known as: XANAX Take 0.125-0.25 mg by mouth See admin instructions. Take 1/2 tablet by mouth daily if needed take 1 tablet as needed for anxiety   Biotin 1000 MCG tablet Take 1,000 mcg by mouth daily.   calcium carbonate 1500 (600 Ca) MG Tabs tablet Commonly known as: OSCAL Take 1,500 mg by mouth daily with breakfast.   cefdinir 300 MG capsule Commonly known as: OMNICEF Take 1 capsule (300 mg total) by mouth 2 (two) times daily for 4 days.   escitalopram 5 MG tablet Commonly known as: LEXAPRO Take 5 mg by mouth daily.   Melatonin 10 MG Caps Take 10 mg by mouth at bedtime.   omeprazole 20 MG capsule Commonly known as: PRILOSEC Take 2 capsules (40 mg total) by mouth daily. What changed: how much to take   ondansetron 8 MG disintegrating tablet Commonly known as: ZOFRAN-ODT Take 1 tablet (8 mg total) by mouth every 8 (eight) hours as needed for nausea or vomiting.   PARoxetine 30 MG  tablet Commonly known as: PAXIL Take 40 mg by mouth every morning.   PROBIOTIC DAILY PO Take 1 Capful by mouth daily.   thiamine 100 MG tablet Commonly known as: Vitamin B-1 Take 1 tablet (100 mg total) by mouth daily. Start taking on: April 27, 2022   valsartan 40 MG tablet Commonly known as: DIOVAN Take 1 tablet (40 mg total) by mouth daily. What changed:  medication strength how much to take   vitamin B-12 500 MCG tablet Commonly known as: CYANOCOBALAMIN Take 500 mcg by mouth daily.        Follow-up Information     Jonathon Jordan, MD. Schedule an appointment as soon as possible for a visit in 10 day(s).   Specialty: Family Medicine Contact information: [place] [address] [phone]                Discharge Exam: Danley Danker Weights   04/24/22 1245 04/24/22 1709  Weight: 76.2 kg 74 kg   General exam: Alert, awake and following commands appropriately.  No nausea or vomiting.  No fever. Respiratory system: Clear to auscultation. Respiratory effort normal.  Good saturation on room air. Cardiovascular system:RRR. No murmurs, rubs, gallops. Gastrointestinal system: Abdomen is nondistended, soft and nontender. No organomegaly or masses felt. Normal bowel sounds heard. Central nervous system: Alert and oriented. No focal neurological deficits. Extremities: No cyanosis or clubbing. Skin: No petechiae. Psychiatry: Judgement and insight appear normal. Mood &  affect appropriate.    Condition at discharge: Stable and improved.  The results of significant diagnostics from this hospitalization (including imaging, microbiology, ancillary and laboratory) are listed below for reference.   Imaging Studies: CT ABDOMEN PELVIS W CONTRAST  Result Date: 04/24/2022 CLINICAL DATA:  Acute abdominal pain. EXAM: CT ABDOMEN AND PELVIS WITH CONTRAST TECHNIQUE: Multidetector CT imaging of the abdomen and pelvis was performed using the standard  protocol following bolus administration of intravenous contrast. RADIATION DOSE REDUCTION: This exam was performed according to the departmental dose-optimization program which includes automated exposure control, adjustment of the mA and/or kV according to patient size and/or use of iterative reconstruction technique. CONTRAST:  180m OMNIPAQUE IOHEXOL 300 MG/ML  SOLN COMPARISON:  CT abdomen pelvis dated 05/24/2005. FINDINGS: Lower chest: A 4 mm solid pulmonary nodule seen in the medial aspect of the left lower lobe (series 4, image 14). Hepatobiliary: No focal liver abnormality is seen. No gallstones, gallbladder wall thickening, or biliary dilatation. Pancreas: Unremarkable. No pancreatic ductal dilatation or surrounding inflammatory changes. Spleen: Normal in size without focal abnormality. Adrenals/Urinary Tract: Adrenal glands are unremarkable. A 4.5 cm cyst in the inferior pole left kidney is benign and no imaging follow-up is recommended for this finding. Otherwise, the kidneys are normal, without renal calculi, focal lesion, or hydronephrosis. Bladder is unremarkable. Stomach/Bowel: Stomach is within normal limits. The appendix appears to be absent. No evidence of bowel wall thickening, distention, or inflammatory changes. Vascular/Lymphatic: Aortic atherosclerosis. No enlarged abdominal or pelvic lymph nodes. Reproductive: Status post hysterectomy. No adnexal masses. Other: No abdominal wall hernia or abnormality. No abdominopelvic ascites. Musculoskeletal: Degenerative changes are seen in the spine. IMPRESSION: 1. No acute findings in the abdomen or pelvis. 2. Solid pulmonary nodule measuring 4 mm in the left lower lobe. Although likely benign, if the patient is high-risk, given the morphology and/or location of this nodule a non-contrast chest CT can be considered in 12 months.This recommendation follows the consensus statement: Guidelines for Management of Incidental Pulmonary Nodules Detected on CT  Images: From the Fleischner Society 2017; Radiology 2017; 284:228-243. Aortic Atherosclerosis (ICD10-I70.0). Electronically Signed   By: TZerita BoersM.D.   On: 04/24/2022 14:25   ECHOCARDIOGRAM COMPLETE  Result Date: 04/14/2022    ECHOCARDIOGRAM REPORT   Patient Name:   PFELESIA STAHLECKERDate of Exam: 04/14/2022 Medical Rec #:  [id number]       Height:       65.0 in Accession #:    [id number]      Weight:       172.0 lb Date of Birth:  904/16/1947       BSA:          1.855 m Patient Age:    89years         BP:           156/72 mmHg Patient Gender: F                HR:           51 bpm. Exam Location:  [address] Procedure: 2D Echo, Cardiac Doppler and Color Doppler Indications:    I10 Hypertension  History:        Patient has no prior history of Echocardiogram examinations.                 Arrythmias:RBBB; Risk Factors:Dyslipidemia. Prediabetes.                 Cerebrovascular disease.  Sonographer:  Diamond Nickel RCS Referring Phys: [place]  1. Left ventricular ejection fraction, by estimation, is 55 to 60%. The left ventricle has normal function. The left ventricle has no regional wall motion abnormalities. There is mild concentric left ventricular hypertrophy. Left ventricular diastolic parameters were normal.  2. Right ventricular systolic function is normal. The right ventricular size is normal.  3. The mitral valve is normal in structure. Trivial mitral valve regurgitation.  4. The aortic valve is tricuspid. Aortic valve regurgitation is mild.  5. The inferior vena cava is normal in size with greater than 50% respiratory variability, suggesting right atrial pressure of 3 mmHg. FINDINGS  Left Ventricle: Left ventricular ejection fraction, by estimation, is 55 to 60%. The left ventricle has normal function. The left ventricle has no regional wall motion abnormalities. The left ventricular internal cavity size was normal in size. There is  mild concentric left ventricular  hypertrophy. Left ventricular diastolic parameters were normal. Right Ventricle: The right ventricular size is normal. Right vetricular wall thickness was not assessed. Right ventricular systolic function is normal. Left Atrium: Left atrial size was normal in size. Right Atrium: Right atrial size was normal in size. Pericardium: There is no evidence of pericardial effusion. Mitral Valve: The mitral valve is normal in structure. Trivial mitral valve regurgitation. Tricuspid Valve: The tricuspid valve is normal in structure. Tricuspid valve regurgitation is trivial. Aortic Valve: The aortic valve is tricuspid. Aortic valve regurgitation is mild. Aortic regurgitation PHT measures 732 msec. Pulmonic Valve: The pulmonic valve was normal in structure. Pulmonic valve regurgitation is not visualized. Aorta: The aortic root and ascending aorta are structurally normal, with no evidence of dilitation. Venous: The inferior vena cava is normal in size with greater than 50% respiratory variability, suggesting right atrial pressure of 3 mmHg. IAS/Shunts: No atrial level shunt detected by color flow Doppler.  LEFT VENTRICLE PLAX 2D LVIDd:         5.10 cm   Diastology LVIDs:         3.40 cm   LV e' medial:    7.18 cm/s LV PW:         1.20 cm   LV E/e' medial:  5.8 LV IVS:        1.20 cm   LV e' lateral:   4.65 cm/s LVOT diam:     2.10 cm   LV E/e' lateral: 9.0 LV SV:         89 LV SV Index:   48 LVOT Area:     3.46 cm  RIGHT VENTRICLE RV Basal diam:  2.00 cm RV Mid diam:    1.40 cm RV S prime:     10.40 cm/s TAPSE (M-mode): 1.6 cm LEFT ATRIUM             Index        RIGHT ATRIUM           Index LA diam:        4.10 cm 2.21 cm/m   RA Area:     12.30 cm LA Vol (A2C):   30.3 ml 16.33 ml/m  RA Volume:   28.10 ml  15.15 ml/m LA Vol (A4C):   21.2 ml 11.43 ml/m LA Biplane Vol: 25.3 ml 13.64 ml/m  AORTIC VALVE LVOT Vmax:   114.00 cm/s LVOT Vmean:  63.900 cm/s LVOT VTI:    0.256 m AI PHT:      732 msec  AORTA Ao Root diam: 3.20 cm Ao  Asc  diam:  3.60 cm MITRAL VALVE MV Area (PHT): 1.53 cm    SHUNTS MV Decel Time: 496 msec    Systemic VTI:  0.26 m MV E velocity: 41.90 cm/s  Systemic Diam: 2.10 cm MV A velocity: 77.00 cm/s MV E/A ratio:  0.54 Dorris Carnes MD Electronically signed by Dorris Carnes MD Signature Date/Time: 04/14/2022/2:22:45 PM    Final     Microbiology: Results for orders placed or performed during the hospital encounter of 08/29/21  Urine Culture     Status: Abnormal   Collection Time: 08/29/21  3:24 PM   Specimen: Urine, Clean Catch  Result Value Ref Range Status   Specimen Description   Final    URINE, CLEAN CATCH Performed at [facility], [address] [place], [place] [address]    Special Requests   Final    NONE Performed at [facility], [address], [place], [place] [address]    Culture (A)  Final    <10,000 COLONIES/mL INSIGNIFICANT GROWTH Performed at [facility] [address] [place], [place] [address]    Report Status 08/30/2021 FINAL  Final    Labs: CBC: Recent Labs  Lab 04/24/22 1256 04/25/22 0346  WBC 7.5 8.1  NEUTROABS 4.9  --   HGB 13.7 12.1  HCT 39.4 36.5  MCV 94.0 97.6  PLT 261 761   Basic Metabolic Panel: Recent Labs  Lab 04/24/22 1256 04/24/22 1757 04/25/22 0014 04/25/22 0346 04/26/22 0516  NA 139 142 139 141 142  K 3.5 3.1* 3.6 4.0 4.0  CL 108 108 110 109 112*  CO2 12* 26 24 27 25   GLUCOSE 111* 107* 111* 103* 125*  BUN 9 8 5* 5* 8  CREATININE 0.75 0.76 0.70 0.67 0.73  CALCIUM 9.3 9.2 8.5* 9.0 8.8*  MG  --   --   --  2.1  --   PHOS  --   --   --  3.9  --    Liver Function Tests: Recent Labs  Lab 04/24/22 1256 04/25/22 0346  AST 18 14*  ALT 15 12  ALKPHOS 70 63  BILITOT 0.6 0.6  PROT 7.2 5.9*  ALBUMIN 3.7 3.2*   CBG: No results for input(s): "GLUCAP" in the last 168 hours.  Discharge time spent: greater than 30 minutes.  Signed: Barton Dubois, MD [facility] 04/26/2022

## 2022-05-06 DIAGNOSIS — N390 Urinary tract infection, site not specified: Secondary | ICD-10-CM | POA: Diagnosis not present

## 2022-05-06 DIAGNOSIS — R69 Illness, unspecified: Secondary | ICD-10-CM | POA: Diagnosis not present

## 2022-05-07 DIAGNOSIS — N390 Urinary tract infection, site not specified: Secondary | ICD-10-CM | POA: Diagnosis not present

## 2022-05-19 DIAGNOSIS — F411 Generalized anxiety disorder: Secondary | ICD-10-CM | POA: Diagnosis not present

## 2022-05-19 DIAGNOSIS — F330 Major depressive disorder, recurrent, mild: Secondary | ICD-10-CM | POA: Diagnosis not present

## 2022-05-19 DIAGNOSIS — R69 Illness, unspecified: Secondary | ICD-10-CM | POA: Diagnosis not present

## 2022-05-19 DIAGNOSIS — F02818 Dementia in other diseases classified elsewhere, unspecified severity, with other behavioral disturbance: Secondary | ICD-10-CM | POA: Diagnosis not present

## 2022-05-19 DIAGNOSIS — G309 Alzheimer's disease, unspecified: Secondary | ICD-10-CM | POA: Diagnosis not present

## 2022-06-11 DIAGNOSIS — F02818 Dementia in other diseases classified elsewhere, unspecified severity, with other behavioral disturbance: Secondary | ICD-10-CM | POA: Diagnosis not present

## 2022-06-11 DIAGNOSIS — G309 Alzheimer's disease, unspecified: Secondary | ICD-10-CM | POA: Diagnosis not present

## 2022-06-11 DIAGNOSIS — F411 Generalized anxiety disorder: Secondary | ICD-10-CM | POA: Diagnosis not present

## 2022-06-11 DIAGNOSIS — R69 Illness, unspecified: Secondary | ICD-10-CM | POA: Diagnosis not present

## 2022-06-11 DIAGNOSIS — F330 Major depressive disorder, recurrent, mild: Secondary | ICD-10-CM | POA: Diagnosis not present

## 2022-06-15 ENCOUNTER — Encounter: Payer: Self-pay | Admitting: Physician Assistant

## 2022-06-15 ENCOUNTER — Ambulatory Visit: Payer: Medicare HMO | Admitting: Physician Assistant

## 2022-06-15 VITALS — BP 123/73 | HR 60 | Resp 20 | Ht 65.0 in | Wt 165.0 lb

## 2022-06-15 DIAGNOSIS — G3184 Mild cognitive impairment, so stated: Secondary | ICD-10-CM | POA: Diagnosis not present

## 2022-06-15 NOTE — Patient Instructions (Signed)
It was a pleasure to see you today at our office.   Recommendations:  Follow up in  6 months Continue memantine as per your doctor FOllow up with psychiatry  Repeat the neurocognitive testing   Whom to call:  Memory  decline, memory medications: Call our office [phone]   For psychiatric meds, mood meds: Please have your primary care physician manage these medications.   Counseling regarding caregiver distress, including caregiver depression, anxiety and issues regarding community resources, adult day care programs, adult living facilities, or memory care questions:   Feel free to contact Emery, Social Worker at [phone]   For assessment of decision of mental capacity and competency:  Call Dr. Anthoney Harada, geriatric psychiatrist at [phone]  For guidance in geriatric dementia issues please call [facility] [phone]  For guidance regarding [facility] and if placement were needed at the facility, contact Arnell Asal, Social Worker tel: [phone]  If you have any severe symptoms of a stroke, or other severe issues such as confusion,severe chills or fever, etc call 911 or go to the ER as you may need to be evaluated further   Feel free to visit Facebook page " [email or web address]" for tips of how to care for people with memory problems.    RECOMMENDATIONS FOR ALL PATIENTS WITH MEMORY PROBLEMS: 1. Continue to exercise (Recommend 30 minutes of walking everyday, or 3 hours every week) 2. Increase social interactions - continue going to [place] and enjoy social gatherings with friends and family 3. Eat healthy, avoid fried foods and eat more fruits and vegetables 4. Maintain adequate blood pressure, blood sugar, and blood cholesterol level. Reducing the risk of stroke and cardiovascular disease also helps promoting better memory. 5. Avoid stressful situations. Live a simple life and avoid aggravations. Organize your time and prepare  for the next day in anticipation. 6. Sleep well, avoid any interruptions of sleep and avoid any distractions in the bedroom that may interfere with adequate sleep quality 7. Avoid sugar, avoid sweets as there is a strong link between excessive sugar intake, diabetes, and cognitive impairment We discussed the Mediterranean diet, which has been shown to help patients reduce the risk of progressive memory disorders and reduces cardiovascular risk. This includes eating fish, eat fruits and green leafy vegetables, nuts like almonds and hazelnuts, walnuts, and also use olive oil. Avoid fast foods and fried foods as much as possible. Avoid sweets and sugar as sugar use has been linked to worsening of memory function.  There is always a concern of gradual progression of memory problems. If this is the case, then we may need to adjust level of care according to patient needs. Support, both to the patient and caregiver, should then be put into place.    FALL PRECAUTIONS: Be cautious when walking. Scan the area for obstacles that may increase the risk of trips and falls. When getting up in the mornings, sit up at the edge of the bed for a few minutes before getting out of bed. Consider elevating the bed at the head end to avoid drop of blood pressure when getting up. Walk always in a well-lit room (use night lights in the walls). Avoid area rugs or power cords from appliances in the middle of the walkways. Use a walker or a cane if necessary and consider physical therapy for balance exercise. Get your eyesight checked regularly.  FINANCIAL OVERSIGHT: Supervision, especially oversight when making financial decisions or transactions is also recommended.  HOME  SAFETY: Consider the safety of the kitchen when operating appliances like stoves, microwave oven, and blender. Consider having supervision and share cooking responsibilities until no longer able to participate in those. Accidents with firearms and other hazards  in the house should be identified and addressed as well.   ABILITY TO BE LEFT ALONE: If patient is unable to contact 911 operator, consider using LifeLine, or when the need is there, arrange for someone to stay with patients. Smoking is a fire hazard, consider supervision or cessation. Risk of wandering should be assessed by caregiver and if detected at any point, supervision and safe proof recommendations should be instituted.  MEDICATION SUPERVISION: Inability to self-administer medication needs to be constantly addressed. Implement a mechanism to ensure safe administration of the medications.   DRIVING: Regarding driving, in patients with progressive memory problems, driving will be impaired. We advise to have someone else do the driving if trouble finding directions or if minor accidents are reported. Independent driving assessment is available to determine safety of driving.   If you are interested in the driving assessment, you can contact the following:  [employer] in [place]  [place] [phone]  [place]  [facility] [phone] or [phone]

## 2022-06-15 NOTE — Progress Notes (Signed)
Assessment/Plan:   Mild Cognitive Impairment   Diana Smith is a very pleasant 76 y.o. RH female with a history of anxiety, depression, OCD, presenting today in follow-up for evaluation of memory loss.  She had neuropsychological evaluation yielding mild neurocognitive disorder, etiology unclear.  She was unable to tolerate donepezil due to GI symptoms.     Recommendations:   Follow up in   months. Follow up with psychiatry Repeat neurocognitive testing for diagnostic clarity and disease trajectory   Psychiatrist gave her memantine 5 mg. Escitalopram no 10 mg  Alprazolam 0.25 mg   Subjective:   This patient is accompanied in the office by ***  who supplements the history. Previous records as well as any outside records available were reviewed prior to todays visit.  ***She was last seen on 12/14/2021   Any changes in memory since last visit?   She had a recent confusional episode due to UTI in October 2023, resolved after treatment with antibiotics. Really good days and not really good days.  repeats oneself?  Endorsed Disoriented when walking into a room?  Patient denies except occasionally not remembering what patient came to the room for ***  Leaving objects in unusual places?  Patient denies   Wandering behavior?  Patient denies   Any personality changes since last visit?  Patient denies   Any worsening depression?:  Patient denies   Hallucinations or paranoia?  Patient denies   Seizures?   Patient denies    Any sleep changes?  Denies  vivid dreams, REM behavior or sleepwalking  takes melatonin Sleep apnea?  Patient denies   Any hygiene concerns?  Patient denies   Independent of bathing and dressing?  Endorsed  Does the patient needs help with medications? is in charge *** Who is in charge of the finances?   is in charge   *** Any changes in appetite?  Patient denies ***   Patient have trouble swallowing? Patient denies   Does the patient cook?  Any kitchen  accidents such as leaving the stove on? Patient denies   Any headaches?  Patient denies   Chronic back pain Patient denies   Ambulates with difficulty?   Patient denies   Recent falls or head injuries?  Patient denies     Unilateral weakness, numbness or tingling?  Patient denies   Any tremors?  Patient denies   Any anosmia?  Patient denies   Any incontinence of urine?  Patient denies she had a recent UTI requiring presentation to the ED Any bowel dysfunction?   Patient denies      Patient lives  *** Does the patient drive?***  History on Initial Assessment 01/08/2021: This is a 76 year old left-handed woman with a history of anxiety,depression, presenting for evaluation of memory loss. She is not sure of her age and states she is 27. She gets emotional speaking about her memory. Her husband started noticing changes around 2 years ago, "she was just so upset I thought she was bipolar." She would get very upset then calm down. She was started on Paxil which helped, if she does not take it she gets really upset. He notices she repeats the same question 4-5 times. She denies getting lost driving but has to concentrate where she is going, realizing she had to turn previously. She used to manage finances without difficulties, her husband took over 4-5 years ago because she could not do [employer]. She rarely misses her medications. She is tearful and notes  waking up feeling depressed. She takes her medication and starts doing things around the house and starts feeling better. There are several family issues, they have taken care of their 2 grandchildren since 2014. Her son does not have a good relationship with them which bothers her. She gets choked up when he relates that her 66 year old mother calls her daily that the mother feels depressed, "she worries herself to death about her mother," who only wants [name] to stay with her. This has been ongoing for the past 2 years. She gets nervous worrying about  her mother and grandchildren. They are on a weird schedule with their grandchildren, she gets 8 hours of sleep but does not feel rested. Her husband notes she talks in her sleep, sometimes screams out, opens her eyes and looks at him, then a few minutes later talks again "like in a zone." These only occur at night, around three times a week. She acts out her dreams. No hallucinations. She states she worries about everything. She always had "a taste of anxiety when younger," stating it just seems so complicated and she is so sorry it is going on.    She denies any headaches, dizziness, vision changes, focal numbness/tingling/weakness, bowel/bladder dysfunction, anosmia, or tremors. She has right shoulder pain. She denies any falls but is more careful walking up steps. Her father had memory issues, her maternal grandmother "was a little crazy." No history of significant head injuries or alcohol use.    Diagnostic Data: Brain MRI without contrast done 12/2020 no acute changes, mild to moderate atrophy and mild chronic microvascular disease.   Neuropsychological evaluation done 04/2021 indicated Mild Neurocognitive Disorder,etiology unclear. Findings did not suggest strong evidence for a memory storage deficit, rather there was significant difficulty learning and later accessing learned information. Findings were inconsistent with AD, although logopenic subtype PPA remained on the differential, but unlikely at present time. Vascular etiology may be consistent with her testing, however neuroimaging was not consistent. No compelling evidence for LBD or bvFTD. Mild depression and anxiety were not felt to be solely responsible for cognitive changes, but likely exacerbating underlying dysfunction   Past Medical History:  Diagnosis Date   Actinic keratosis    Allergic rhinitis due to pollen    Cerebrovascular disease    Frequent urinary tract infections    Gastro-esophageal reflux disease without esophagitis     Generalized anxiety disorder    Heartburn    Hematochezia    History of COVID-19    Hyperglycemia    Hyperlipidemia    Major depressive disorder    Mild neurocognitive disorder, unclear etiology [id number]   Nonalcoholic steatohepatitis (NASH)    Osteoarthritis of right knee 01/25/2021   Overactive bladder    Personal history of colonic polyps    Prediabetes    Pure hypercholesterolemia    Right bundle branch block    Rosacea    Sciatica, right side    Slow transit constipation    Vitamin B12 deficiency (non anemic)    Vitamin D deficiency      Past Surgical History:  Procedure Laterality Date   HYSTEROTOMY     MASS EXCISION  10/08/2011   Procedure: MINOR EXCISION OF MASS;  Surgeon: Cammie Sickle., MD;  Location: [facility];  Service: Orthopedics;  Laterality: Left;  excisional biopsy dorsum left long finger MCP joint     PREVIOUS MEDICATIONS:   CURRENT MEDICATIONS:  Outpatient Encounter Medications as of 06/15/2022  Medication Sig  ALPRAZolam (XANAX) 0.25 MG tablet Take 0.125-0.25 mg by mouth See admin instructions. Take 1/2 tablet by mouth daily if needed take 1 tablet as needed for anxiety   Biotin 1000 MCG tablet Take 1,000 mcg by mouth daily.   calcium carbonate (OSCAL) 1500 (600 Ca) MG TABS tablet Take 1,500 mg by mouth daily with breakfast.   escitalopram (LEXAPRO) 5 MG tablet Take 5 mg by mouth daily.   Melatonin 10 MG CAPS Take 10 mg by mouth at bedtime.   omeprazole (PRILOSEC) 20 MG capsule Take 2 capsules (40 mg total) by mouth daily.   ondansetron (ZOFRAN-ODT) 8 MG disintegrating tablet Take 1 tablet (8 mg total) by mouth every 8 (eight) hours as needed for nausea or vomiting.   PARoxetine (PAXIL) 30 MG tablet Take 40 mg by mouth every morning.   Probiotic Product (PROBIOTIC DAILY PO) Take 1 Capful by mouth daily.   thiamine (VITAMIN B-1) 100 MG tablet Take 1 tablet (100 mg total) by mouth daily.   valsartan (DIOVAN) 40 MG tablet Take 1  tablet (40 mg total) by mouth daily.   vitamin B-12 (CYANOCOBALAMIN) 500 MCG tablet Take 500 mcg by mouth daily.   No facility-administered encounter medications on file as of 06/15/2022.     Objective:     PHYSICAL EXAMINATION:    VITALS:  There were no vitals filed for this visit.  GEN:  The patient appears stated age and is in NAD. HEENT:  Normocephalic, atraumatic.   Neurological examination:  General: NAD, well-groomed, appears stated age. Orientation: The patient is alert. Oriented to person, place and date Cranial nerves: There is good facial symmetry.The speech is fluent and clear. No aphasia or dysarthria. Fund of knowledge is appropriate. Recent memory impaired and remote memory is normal.  Attention and concentration are normal.  Able to name objects and repeat phrases.  Hearing is intact to conversational tone.    Sensation: Sensation is intact to light touch throughout Motor: Strength is at least antigravity x4. Tremors: none  DTR's 2/4 in UE/LE      01/08/2021    9:00 AM  Montreal Cognitive Assessment   Visuospatial/ Executive (0/5) 4  Naming (0/3) 2  Attention: Read list of digits (0/2) 1  Attention: Read list of letters (0/1) 0  Attention: Serial 7 subtraction starting at 100 (0/3) 1  Language: Repeat phrase (0/2) 0  Language : Fluency (0/1) 0  Abstraction (0/2) 2  Delayed Recall (0/5) 0  Orientation (0/6) 6  Total 16  Adjusted Score (based on education) 17        No data to display             Movement examination: Tone: There is normal tone in the UE/LE Abnormal movements:  no tremor.  No myoclonus.  No asterixis.   Coordination:  There is no decremation with RAM's. Normal finger to nose  Gait and Station: The patient has no difficulty arising out of a deep-seated chair without the use of the hands. The patient's stride length is good.  Gait is cautious and narrow.   Thank you for allowing [place] the opportunity to participate in the care of this  nice patient. Please do not hesitate to contact us for any questions or concerns.   Total time spent on today's visit was *** minutes dedicated to this patient today, preparing to see patient, examining the patient, ordering tests and/or medications and counseling the patient, documenting clinical information in the EHR or other health record, independently  interpreting results and communicating results to the patient/family, discussing treatment and goals, answering patient's questions and coordinating care.  Cc:  Jonathon Jordan, MD  Sharene Butters 06/15/2022 7:58 AM

## 2022-06-24 ENCOUNTER — Other Ambulatory Visit: Payer: Self-pay | Admitting: Family Medicine

## 2022-06-24 DIAGNOSIS — Z1231 Encounter for screening mammogram for malignant neoplasm of breast: Secondary | ICD-10-CM

## 2022-08-06 DIAGNOSIS — G309 Alzheimer's disease, unspecified: Secondary | ICD-10-CM | POA: Diagnosis not present

## 2022-08-06 DIAGNOSIS — F411 Generalized anxiety disorder: Secondary | ICD-10-CM | POA: Diagnosis not present

## 2022-08-06 DIAGNOSIS — R69 Illness, unspecified: Secondary | ICD-10-CM | POA: Diagnosis not present

## 2022-08-06 DIAGNOSIS — F02818 Dementia in other diseases classified elsewhere, unspecified severity, with other behavioral disturbance: Secondary | ICD-10-CM | POA: Diagnosis not present

## 2022-08-06 DIAGNOSIS — F330 Major depressive disorder, recurrent, mild: Secondary | ICD-10-CM | POA: Diagnosis not present

## 2022-08-09 DIAGNOSIS — R69 Illness, unspecified: Secondary | ICD-10-CM | POA: Diagnosis not present

## 2022-08-09 DIAGNOSIS — L84 Corns and callosities: Secondary | ICD-10-CM | POA: Diagnosis not present

## 2022-08-16 ENCOUNTER — Ambulatory Visit: Payer: Medicare HMO

## 2022-09-09 ENCOUNTER — Encounter: Payer: Self-pay | Admitting: Psychology

## 2022-09-10 ENCOUNTER — Ambulatory Visit: Payer: Medicare HMO | Admitting: Psychology

## 2022-09-10 ENCOUNTER — Encounter: Payer: Self-pay | Admitting: Psychology

## 2022-09-10 DIAGNOSIS — G309 Alzheimer's disease, unspecified: Secondary | ICD-10-CM

## 2022-09-10 DIAGNOSIS — F0280 Dementia in other diseases classified elsewhere without behavioral disturbance: Secondary | ICD-10-CM | POA: Diagnosis not present

## 2022-09-10 DIAGNOSIS — R4189 Other symptoms and signs involving cognitive functions and awareness: Secondary | ICD-10-CM

## 2022-09-10 HISTORY — DX: Dementia in other diseases classified elsewhere, unspecified severity, without behavioral disturbance, psychotic disturbance, mood disturbance, and anxiety: F02.80

## 2022-09-10 NOTE — Progress Notes (Signed)
   Psychometrician Note   Cognitive testing was administered to Diana Smith by Diana Smith, B.S. (psychometrist) under the supervision of Dr. Christia Reading, Ph.D., licensed psychologist on 09/10/2022. Diana Smith did not appear overtly distressed by the testing session per behavioral observation or responses across self-report questionnaires. Rest breaks were offered.    The battery of tests administered was selected by Dr. Christia Reading, Ph.D. with consideration to Diana Smith's current level of functioning, the nature of her symptoms, emotional and behavioral responses during interview, level of literacy, observed level of motivation/effort, and the nature of the referral question. This battery was communicated to the psychometrist. Communication between Dr. Christia Reading, Ph.D. and the psychometrist was ongoing throughout the evaluation and Dr. Christia Reading, Ph.D. was immediately accessible at all times. Dr. Christia Reading, Ph.D. provided supervision to the psychometrist on the date of this service to the extent necessary to assure the quality of all services provided.    Diana Smith will return within approximately 1-2 weeks for an interactive feedback session with Dr. Melvyn Novas at which time her test performances, clinical impressions, and treatment recommendations will be reviewed in detail. Diana Smith understands she can contact our office should she require our assistance before this time.  A total of 125 minutes of billable time were spent face-to-face with Diana Smith by the psychometrist. This includes both test administration and scoring time. Billing for these services is reflected in the clinical report generated by Dr. Christia Reading, Ph.D.  This note reflects time spent with the psychometrician and does not include test scores or any clinical interpretations made by Dr. Melvyn Novas. The full report will follow in a separate note.

## 2022-09-10 NOTE — Progress Notes (Unsigned)
NEUROPSYCHOLOGICAL EVALUATION [facility] Department of Neurology  Date of Evaluation: September 10, 2022  Reason for Referral:   Diana Smith Smith a 77 y.o. left-handed Caucasian female referred by Sharene Butters, PA-C, to characterize her current cognitive functioning and assist with diagnostic clarity and treatment planning in the context of a prior mild neurocognitive disorder diagnosis and concerns for progressive cognitive decline.   Assessment and Plan:   Clinical Impression(s): Diana Smith' pattern of performance Smith suggestive of severe impairment surrounding all aspects of learning and memory. Additional impairments were exhibited across processing speed, executive functioning, and phonemic fluency, while performance variability was exhibited across complex attention, semantic fluency, confrontation naming, and visuospatial abilities. Basic attention represented her only area of consistently adequate performance. Functionally, Diana Smith' husband reported decline relative to her previous evaluation, noting that she has not resumed driving since [id number] and that he has had to fully take over all medication, financial, and bill paying responsibilities. Given evidence for significant cognitive and functional impairment, Diana Smith meets diagnostic criteria for a Major Neurocognitive Disorder ("dementia") at the present time.  Relative to her previous evaluation in November 2022, significant decline was exhibited. This was most prominent across delayed retrieval and recognition/consolidation aspects of memory. Significant decline was also noteworthy across processing speed, executive functioning, and receptive language. Unfortunately, no areas of functioning exhibited any degree of improvement as suspected by Diana Smith during interview.   Regarding etiology, progressive cognitive decline suggests an underlying neurodegenerative illness. Results across her previous  evaluation were unable to firmly rule in or out Alzheimer's disease, largely due to intact recognition performances across memory testing preventing scores from falling in a classic Alzheimer's disease pattern. Unfortunately, notable memory decline in this aspect has occurred and her current patterns of impairment now align with the presence of this illness reasonably well. Across memory testing, Diana Smith did not benefit from repeated exposure to new information, was fully amnestic (i.e., 0% retention) after brief delays, and consistently performed very poorly across yes/no recognition trials. Taken together, this suggests rapid forgetting and a profound storage impairment which are the hallmark characteristics of this illness across neurocognitive testing. Further variability/weakness across semantic fluency, confrontation naming, and cognitive flexibility would follow typical disease progression. Overall, Alzheimer's disease represents the most likely cause for ongoing impairment and progressive decline.   Recommendations: Diana Smith has already been prescribed a medication aimed to address memory loss and concerns surrounding Alzheimer's disease (i.e., memantine/Namenda). She Smith encouraged to continue taking this medication as prescribed. It Smith important to highlight that this medication has been shown to slow functional decline in some individuals. There Smith no current treatment which can stop or reverse cognitive decline when caused by a neurodegenerative illness.   I agree with current driving restrictions and share prior recommendations that Diana Smith should fully abstain from all driving behaviors without exception.   It will be important for Diana Smith to have another person with her when in situations where she may need to process information, weigh the pros and cons of different options, and make decisions, in order to ensure that she fully understands and recalls all information to be  considered.  If not already done, Diana Smith and her family may want to discuss her wishes regarding durable power of attorney and medical decision making, so that she can have input into these choices. If they require legal assistance with this, long-term care resource access, or other aspects of estate planning, they could  reach out to [facility] at [phone] for a free consultation. Additionally, they may wish to discuss future plans for caretaking and seek out community options for in home/residential care should they become necessary.  Diana Smith encouraged to attend to lifestyle factors for brain health (e.g., regular physical exercise, good nutrition habits and consideration of the MIND-DASH diet, regular participation in cognitively-stimulating activities, and general stress management techniques), which are likely to have benefits for both emotional adjustment and cognition. Optimal control of vascular risk factors (including safe cardiovascular exercise and adherence to dietary recommendations) Smith encouraged. Continued participation in activities which provide mental stimulation and social interaction Smith also recommended.   Important information should be provided to Diana Smith in written format in all instances. This information should be placed in a highly frequented and easily visible location within her home to promote recall. External strategies such as written notes in a consistently used memory journal, visual and nonverbal auditory cues such as a calendar on the refrigerator or appointments with alarm, such as on a cell phone, can also help maximize recall.  Review of Records:   Diana Smith was seen by [facility] Ellouise Newer, M.D.) on 01/08/2021 for an evaluation of memory loss. Her husband started noticing changes around two years prior. He stated that she will repeat the same question 4-5 times. She denied getting lost driving but has to concentrate where she Smith  going and will miss turns occasionally. She used to manage finances without difficulties. 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 She rarely misses her medications. Her husband also reported some personality changes in that Diana Smith "was just so upset I thought she was bipolar" (i.e., she would get very upset then calm down). She was started on Paxil which yielded a noticeable improvement. Despite this, she Smith commonly tearful and reported waking up feeling depressed. There are several family issues which add to her overall degree of stress and anxiety. Much of this surrounds her caring for her 65 year old mother, as well as her two grandchildren. Diana Smith gets about eight hours of sleep per night but does not feel rested. Her husband noted that she will talk and sometimes scream out in her sleep. There was mention that she acts out her dreams. Hallucinations were denied. Performance on a brief cognitive screening instrument (MOCA) was 17/30. Ultimately, Ms. Rotz was referred for a comprehensive neuropsychological evaluation to characterize her cognitive abilities and to assist with diagnostic clarity and treatment planning.   She completed a comprehensive neuropsychological evaluation with myself on 05/25/2021. Results suggested primary impairments surrounding executive functioning, as well as both encoding (i.e., learning) and retrieval aspects of verbal memory. Further performance variability was exhibited across verbal fluency, with semantic fluency exhibiting greater impairment relative to phonemic fluency. Performance was generally consistent with premorbid intellectual estimations across domains of processing speed, attention/concentration, receptive language, confrontation naming, visuospatial abilities, and visual memory. Given some report of intact day-to-day functioning, she was diagnosed with a mild neurocognitive disorder. It was expressed that  she was towards the severe end of this spectrum. The underlying etiology was unclear at that time. Alzheimer's disease could not be ruled out. However, she did not exhibit a classic pattern of impairment at that time, particularly surrounding adequate recognition across all memory tasks. Repeat testing in 18-24 months was recommended.   She followed with neurology since then, most recently meeting with Sharene Butters, PA-C, on 06/15/2022. She had  been prescribed donepezil following her evaluation. However, this had to be discontinued due to negative side effects and she was started on memantine. There was report of increased confusion surrounding an experienced UTI in October 2023. Concern was expressed surrounding progressive cognitive decline and an underlying neurodegenerative illness. Ultimately, Ms. Grondahl was referred for a repeat neuropsychological evaluation to characterize her cognitive abilities and to assist with diagnostic clarity and treatment planning.    Brain MRI on 01/19/2021 revealed mild-to-moderate generalized atrophy and mild chronic small vessel ischemia. No other neuroimaging was available for review.   Past Medical History:  Diagnosis Date   Abdominal pain 04/24/2022   Actinic keratosis    Allergic rhinitis due to pollen    Cerebrovascular disease    Essential hypertension 04/24/2022   Frequent urinary tract infections    Gastro-esophageal reflux disease without esophagitis    Generalized anxiety disorder    Heartburn    Hematochezia    History of COVID-19    Hyperglycemia    Hyperlipidemia    Hypoalbuminemia due to protein-calorie malnutrition 04/24/2022   Insomnia 04/24/2022   Major depressive disorder    Mild neurocognitive disorder, unclear etiology [id number]   Nonalcoholic steatohepatitis (NASH)    Osteoarthritis of left knee 03/21/2022   Osteoarthritis of right knee 01/25/2021   Overactive bladder    Personal history of colonic polyps    Prediabetes     Pulmonary nodule 04/24/2022   Pure hypercholesterolemia    Right bundle branch block    Rosacea    Sciatica, right side    Slow transit constipation    Starvation ketoacidosis 04/24/2022   UTI (urinary tract infection) 04/24/2022   Vitamin B12 deficiency (non anemic)    Vitamin D deficiency     Past Surgical History:  Procedure Laterality Date   HYSTEROTOMY     MASS EXCISION  10/08/2011   Procedure: MINOR EXCISION OF MASS;  Surgeon: Cammie Sickle., MD;  Location: [place];  Service: Orthopedics;  Laterality: Left;  excisional biopsy dorsum left long finger MCP joint    Current Outpatient Medications:    memantine (NAMENDA) 10 MG tablet, , Disp: , Rfl:    ALPRAZolam (XANAX) 0.25 MG tablet, Take 0.125-0.25 mg by mouth See admin instructions. Take 1/2 tablet by mouth daily if needed take 1 tablet as needed for anxiety, Disp: , Rfl:    Biotin 1000 MCG tablet, Take 1,000 mcg by mouth daily., Disp: , Rfl:    calcium carbonate (OSCAL) 1500 (600 Ca) MG TABS tablet, Take 1,500 mg by mouth daily with breakfast., Disp: , Rfl:    escitalopram (LEXAPRO) 5 MG tablet, Take 5 mg by mouth daily., Disp: , Rfl:    Melatonin 10 MG CAPS, Take 10 mg by mouth at bedtime., Disp: , Rfl:    omeprazole (PRILOSEC) 20 MG capsule, Take 2 capsules (40 mg total) by mouth daily., Disp: 60 capsule, Rfl: 2   ondansetron (ZOFRAN-ODT) 8 MG disintegrating tablet, Take 1 tablet (8 mg total) by mouth every 8 (eight) hours as needed for nausea or vomiting., Disp: 20 tablet, Rfl: 0   PARoxetine (PAXIL) 30 MG tablet, Take 40 mg by mouth every morning., Disp: , Rfl:    Probiotic Product (PROBIOTIC DAILY PO), Take 1 Capful by mouth daily., Disp: , Rfl:    thiamine (VITAMIN B-1) 100 MG tablet, Take 1 tablet (100 mg total) by mouth daily., Disp: 30 tablet, Rfl: 1   valsartan (DIOVAN) 40 MG tablet, Take 1 tablet (40  mg total) by mouth daily., Disp: 30 tablet, Rfl: 1   vitamin B-12 (CYANOCOBALAMIN) 500 MCG  tablet, Take 500 mcg by mouth daily., Disp: , Rfl:   Clinical Interview:   The following information was obtained during a clinical interview with Diana Smith and her husband prior to cognitive testing.  Cognitive Symptoms: Decreased short-term memory: Endorsed. Diana Smith previously reported increased difficulties losing her train of thought and finding her words. Her husband had also added that she had become increasingly repetitive in conversation. Diana Smith reported similar difficulties during the current interview, but reported her belief that these abilities had actually mildly improved over time. Her husband continued to express concern surrounding progressive decline. Decreased long-term memory: Denied. Decreased attention/concentration: Endorsed. She previously reported trouble with sustained attention and increased distractibility. She noted being forced to stop and focus her attention, otherwise she will be unable to focus effectively. This was said to be stable.  Reduced processing speed: Endorsed. Difficulties with executive functions: Endorsed. She previously reported trouble with organization and indecisiveness. She also reported trouble with impulsivity but was unable to provide any examples or further explanation. This was said to be stable. They both denied any trouble with impulsivity or significant personality changes lately. Difficulties with emotion regulation: Denied. Difficulties with receptive language: Denied. Difficulties with word finding: Endorsed. Decreased visuoperceptual ability: Denied.   Trajectory of deficits: Previously, her husband described his perception of memory decline starting in 2020-2021. Ms. Franceschini had reported being unsure if memory changes were age-appropriate, due to ongoing depression and familial stressors, or a combination of both. Per notes from Dr. Delice Smith, ADL dysfunction surrounding financial management seems to be present for the past 5-6  years.    Difficulties completing ADLs: Endorsed. Previously, Ms. Brockmeyer' husband had fully taken over financial management and bill paying responsibilities. This has remained stable. Also previously, Ms. Corbo had recently stopped driving at the advice of her medical team following the experience of a "spell" where she "felt funny" and exhibited lower extremity weakness. Prior to this, she was driving independently. However, she has not returned to driving since this event, with her husband expressing concerns surrounding the likelihood of her getting lost. Where she was managing medications independently during the previous evaluation, her husband now fully manages these responsibilities.   Additional Medical History: History of traumatic brain injury/concussion: Denied. History of stroke: Denied. History of seizure activity: Denied. History of known exposure to toxins: Denied. Symptoms of chronic pain: Endorsed. She previously noted bilateral knee pain and that she was awaiting knee replacement surgeries. She stated that she could not start on this until she had an unspecified tooth issue corrected.  Experience of frequent headaches/migraines: Denied. Frequent instances of dizziness/vertigo: Denied.   Sensory changes: She wears glasses with benefit. Mild hearing loss was also reported. Other sensory changes/difficulties (e.g., taste or smell) were denied.  Balance/coordination difficulties: Endorsed. She previously reported several falls, attributed to knee pain. No recent falls were reported and she described her balance as adequate overall. Other motor difficulties: Denied.  Sleep History: Estimated hours obtained each night: 8 hours.  Difficulties falling asleep: Denied.  Difficulties staying asleep: Denied. Feels rested and refreshed upon awakening: Endorsed.    History of snoring: Denied. History of waking up gasping for air: Denied. Witnessed breath cessation while asleep:  Denied.   History of vivid dreaming: Endorsed. Her husband previously noted that she will often (i.e., several times per week) talk and scream in her sleep.  Excessive movement while asleep:  Denied outside of symptoms which were attributed to a history of restless leg syndrome.  Instances of acting out her dreams: Denied.  Psychiatric/Behavioral Health History: Depression: She previously reported a somewhat longstanding history of depressive symptoms. She did note a significant improvement since starting Paxil. Currently, she acknowledged periods of time where she will experience sadness. This was largely attributed to fears surrounding her future health and overall cognitive dysfunction. Current or remote suicidal ideation, intent, or plan was denied.  Anxiety: Endorsed. Symptoms were largely generalized and somewhat longstanding in nature. Ongoing memory concerns have continued to exacerbate symptoms. Mania: Denied. Trauma History: Denied. Visual/auditory hallucinations: Denied. Delusional thoughts: Denied.   Tobacco: Denied. Alcohol: She denied current alcohol consumption as well as a history of problematic alcohol abuse or dependence.   Recreational drugs: Denied.  Family History: Problem Relation Age of Onset   Dementia Father    Memory loss Father    Breast cancer Neg Hx    This information was confirmed by Diana Smith.  Academic/Vocational History: Highest level of educational attainment: 12 years. She graduated from high school and described herself as an average (B/C) student in academic settings. Reading comprehension perhaps represented a mild weakness.  History of developmental delay: Denied. History of grade repetition: Denied. Enrollment in special education courses: Denied. History of LD/ADHD: Denied.   Employment: Retired. 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   Evaluation Results:   Behavioral Observations: Diana Smith  was accompanied by her husband, arrived to her appointment on time, and was appropriately dressed and groomed. She appeared alert. Observed gait and station were within normal limits. Gross motor functioning appeared intact upon informal observation and no abnormal movements (e.g., tremors) were noted. Her affect was generally relaxed and positive. Spontaneous speech was fluent. Some word finding difficulties and instances where she fumbled over her words were observed during interview. Thought processes were normal in content but certainly tangential at times. Insight into her cognitive difficulties appeared poor given perceived minimal awareness of severe impairment, as well as her report of improvement rather than decline relative to previous testing.   During testing, sustained attention was appropriate. Task engagement was adequate and she persisted when challenged. Overall, Diana Smith was cooperative with the clinical interview and subsequent testing procedures.   Adequacy of Effort: The validity of neuropsychological testing Smith limited by the extent to which the individual being tested may be assumed to have exerted adequate effort during testing. Diana Smith expressed her intention to perform to the best of her abilities and exhibited adequate task engagement and persistence. Scores across stand-alone and embedded performance validity measures were variable but largely within expectation. Her sole below expectation performance Smith likely caused by true and severe memory impairment rather than poor engagement or attempts to perform poorly. As such, the results of the current evaluation are believed to be a valid representation of Diana Smith' current cognitive functioning.  Test Results: Diana Smith was very disoriented at the time of the current evaluation. She was unable to recall her last name when asked and incorrectly stated her age ("33"). She was also unable to state her phone number, the current  date, the current time, or the name of the current clinic.   Intellectual abilities based upon educational and vocational attainment were estimated to be in the average range. Premorbid abilities were estimated to be within the below average range based upon a single-word reading test.   Processing speed was exceptionally  low to well below average. Basic attention was below average to average. More complex attention (e.g., working memory) was well below average to below average. Executive functioning was exceptionally low. She did score in the below average range across a task assessing safety and judgment.  Assessed receptive language abilities were exceptionally low. She had difficulty across all aspects of this task, including understanding directional commands (i.e., point to the shape that Smith right or left of the target). She also had notable trouble understanding more complex sentence structure and following multi-step commands. Assessed expressive language was variable. Phonemic fluency was well below average, semantic fluency was well below average to average, and confrontation naming was average across a screening task but well below average across a more comprehensive task.   Assessed visuospatial/visuoconstructional abilities were variable, ranging from the exceptionally low to well above average normative ranges. Across her drawing of a clock, she exhibited very minor spacing errors in numerical placement. When asked to draw the clock hands, she exhibited notable confusion, ultimately circling the numbers 10 and 11 without any attempt to draw clock hands themselves.     Learning (i.e., encoding) of novel verbal information was exceptionally low. Spontaneous delayed recall (i.e., retrieval) of previously learned information was also exceptionally low. Retention rates were 0% across a story learning task, 0% across a list learning task, and 0% across a figure drawing task. Performance across  recognition tasks was exceptionally low, suggesting negligible evidence for information consolidation.   Results of emotional screening instruments suggested that recent symptoms of generalized anxiety were in the minimal range, while symptoms of depression were within normal limits.  Tables of Scores:   Note: This summary of test scores accompanies the interpretive report and should not be considered in isolation without reference to the appropriate sections in the text. Descriptors are based on appropriate normative data and may be adjusted based on clinical judgment. Terms such as "Within Normal Limits" and "Outside Normal Limits" are used when a more specific description of the test score cannot be determined. Descriptors refer to the current evaluation only.         Percentile - Normative Descriptor > 98 - Exceptionally High 91-97 - Well Above Average 75-90 - Above Average 25-74 - Average 9-24 - Below Average 2-8 - Well Below Average < 2 - Exceptionally Low        Validity:    DESCRIPTOR   November 2022 Current    Dot Counting Test: --- --- --- Within Normal Limits  RBANS Effort Index: --- --- --- Outside Normal Limits  WAIS-IV Reliable Digit Span: --- --- --- Within Normal Limits        Orientation:       Raw Score Raw Score Percentile   NAB Orientation, Form 1 24/29 18/29 --- ---        Cognitive Screening:       Raw Score Raw Score Percentile   SLUMS: 14/30 9/30 --- ---        RBANS, Form A: Standard Score/ Scaled Score Standard Score/ Scaled Score Percentile   Total Score 70 59 <1 Exceptionally Low  Immediate Memory 49 44 <1 Exceptionally Low    List Learning 1 1 <1 Exceptionally Low    Story Memory 3 2 <1 Exceptionally Low  Visuospatial/Constructional 102 102 55 Average    Figure Copy 14 14 91 Well Above Average    Line Orientation 13/20 13/20 10-16 Below Average  Language 85 85 16 Below Average    Picture  Naming 9/10 9/10 26-50 Average    Semantic Fluency 4 4 2   Well Below Average  Attention 88 68 2 Well Below Average    Digit Span 7 7 16  Below Average    Coding 9 3 1  Exceptionally Low  Delayed Memory 60 40 <1 Exceptionally Low    List Recall 1/10 0/10 <2 Exceptionally Low    List Recognition 17/20 7/20 <2 Exceptionally Low    Story Recall 1 1 <1 Exceptionally Low    Story Recognition 8/12 1/12 <1 Exceptionally Low    Figure Recall 6 1 <1 Exceptionally Low    Figure Recognition 6/8 0/8 1 Exceptionally Low         Intellectual Functioning:       Standard Score Standard Score Percentile   Test of Premorbid Functioning: 87 86 18 Below Average        Attention/Executive Function:      Trail Making Test (TMT): Raw Score (T Score) Raw Score (T Score) Percentile     Part A 51 secs.,  1 error (43) 64 secs.,  1 error (34) 5 Well Below Average    Part B Discontinued Discontinued --- Impaired          Scaled Score Scaled Score Percentile   WAIS-IV Digit Span: 9 6 9  Below Average    Forward 12 9 37 Average    Backward 8 6 9  Below Average    Sequencing 6 4 2  Well Below Average         Scaled Score Scaled Score Percentile   WAIS-IV Similarities: 6 2 <1 Exceptionally Low        D-KEFS Color-Word Interference Test: Raw Score (Scaled Score) Raw Score (Scaled Score) Percentile     Color Naming 41 secs. (7) 68 secs. (1) <1 Exceptionally Low    Word Reading 24 secs. (11) 39 secs. (3) 1 Exceptionally Low    Inhibition 110 secs (4) 18 errors (1) Discontinued --- Impaired    Inhibition/Switching Discontinued Discontinued --- Impaired        D-KEFS Verbal Fluency Test: Raw Score (Scaled Score) Raw Score (Scaled Score) Percentile     Letter Total Correct 21 (6) 18 (5) 5 Well Below Average    Category Total Correct 26 (7) 27 (8) 25 Average    Category Switching Total Correct 7 (4) 2 (1) <1 Exceptionally Low    Category Switching Accuracy 6 (5) 1 (1) <1 Exceptionally Low      Total Set Loss Errors 5 (6) 8 (3) 1 Exceptionally Low      Total Repetition  Errors 3 (10) 5 (8) 25 Average        NAB Executive Functions Module, Form 1: T Score T Score Percentile     Judgment 41 37 9 Below Average        Language:      Verbal Fluency Test: Raw Score (T Score) Raw Score (T Score) Percentile     Phonemic Fluency (FAS) 21 (33) 18 (33) 5 Well Below Average    Animal Fluency 10 (29) 12 (38) 12 Below Average         NAB Language Module, Form 1: T Score T Score Percentile     Auditory Comprehension 37 23 <1 Exceptionally Low    Naming 26/31 (37) 24/31 (32) 4 Well Below Average        Visuospatial/Visuoconstruction:       Raw Score Raw Score Percentile   Clock Drawing: 10/10 5/10 --- Impaired  Scaled Score Scaled Score Percentile   WAIS-IV Block Design: 11 10 50 Average        Mood and Personality:       Raw Score Raw Score Percentile   Geriatric Depression Scale: 14 5 --- Within Normal Limits  PROMIS Anxiety Questionnaire: --- 12 --- None to Slight        Additional Questionnaires:       Raw Score Raw Score Percentile   PROMIS Sleep Disturbance Questionnaire: 13 --- --- ---   Informed Consent and Coding/Compliance:   The current evaluation represents a clinical evaluation for the purposes previously outlined by the referral source and Smith in no way reflective of a forensic evaluation.   Ms. Hashemi was provided with a verbal description of the nature and purpose of the present neuropsychological evaluation. Also reviewed were the foreseeable risks and/or discomforts and benefits of the procedure, limits of confidentiality, and mandatory reporting requirements of this provider. The patient was given the opportunity to ask questions and receive answers about the evaluation. Oral consent to participate was provided by the patient.   This evaluation was conducted by Christia Reading, Ph.D., ABPP-CN, board certified clinical neuropsychologist. Ms. Helmick completed a clinical interview with Dr. Melvyn Novas, billed as one unit [phone], and 125 minutes  of cognitive testing and scoring, billed as one unit [phone] and three additional units [id number]. Psychometrist Milana Kidney, B.S., assisted Dr. Melvyn Novas with test administration and scoring procedures. As a separate and discrete service, Dr. Melvyn Novas spent a total of 160 minutes in interpretation and report writing billed as one unit [phone] and two units [id number].

## 2022-09-17 ENCOUNTER — Encounter: Payer: Self-pay | Admitting: Psychology

## 2022-09-17 ENCOUNTER — Ambulatory Visit: Payer: Medicare HMO | Admitting: Psychology

## 2022-09-17 DIAGNOSIS — G309 Alzheimer's disease, unspecified: Secondary | ICD-10-CM | POA: Diagnosis not present

## 2022-09-17 DIAGNOSIS — F0280 Dementia in other diseases classified elsewhere without behavioral disturbance: Secondary | ICD-10-CM | POA: Diagnosis not present

## 2022-09-17 NOTE — Progress Notes (Signed)
   Neuropsychology Feedback Session Diana Smith Department of Neurology  Reason for Referral:   Diana Smith is a 77 y.o. left-handed Caucasian female referred by Sharene Butters, PA-C, to characterize her current cognitive functioning and assist with diagnostic clarity and treatment planning in the context of a prior mild neurocognitive disorder diagnosis and concerns for progressive cognitive decline.   Feedback:   Diana Smith completed a comprehensive neuropsychological evaluation on 09/10/2022. Please refer to that encounter for the full report and recommendations. Briefly, results suggested severe impairment surrounding all aspects of learning and memory. Additional impairments were exhibited across processing speed, executive functioning, and phonemic fluency, while performance variability was exhibited across complex attention, semantic fluency, confrontation naming, and visuospatial abilities. Relative to her previous evaluation in November 2022, significant decline was exhibited. This was most prominent across delayed retrieval and recognition/consolidation aspects of memory. Significant decline was also noteworthy across processing speed, executive functioning, and receptive language. Regarding etiology, progressive cognitive decline suggests an underlying neurodegenerative illness. Unfortunately, notable memory decline in this aspect has occurred and her current patterns of impairment now align with the presence of this illness reasonably well. Across memory testing, Diana Smith did not benefit from repeated exposure to new information, was fully amnestic (i.e., 0% retention) after brief delays, and consistently performed very poorly across yes/no recognition trials. Taken together, this suggests rapid forgetting and a profound storage impairment which are the hallmark characteristics of this illness across neurocognitive testing. Further variability/weakness across  semantic fluency, confrontation naming, and cognitive flexibility would follow typical disease progression. Overall, Alzheimer's disease represents the most likely cause for ongoing impairment and progressive decline.   Diana Smith was accompanied by her husband during the current feedback session. Content of the current session focused on the results of her neuropsychological evaluation. Diana Smith was given the opportunity to ask questions and her questions were answered. She was encouraged to reach out should additional questions arise. A copy of her report was provided at the conclusion of the visit.      Greater than 31 minutes were spent preparing for, conducting, and documenting the current feedback session with Diana Smith, billed as one unit [phone].

## 2022-09-21 ENCOUNTER — Ambulatory Visit: Payer: Medicare HMO | Admitting: Physician Assistant

## 2022-09-21 ENCOUNTER — Encounter: Payer: Self-pay | Admitting: Physician Assistant

## 2022-09-21 VITALS — BP 150/72 | HR 79 | Ht 65.5 in | Wt 169.0 lb

## 2022-09-21 DIAGNOSIS — G309 Alzheimer's disease, unspecified: Secondary | ICD-10-CM | POA: Diagnosis not present

## 2022-09-21 DIAGNOSIS — F0280 Dementia in other diseases classified elsewhere without behavioral disturbance: Secondary | ICD-10-CM

## 2022-09-21 DIAGNOSIS — R69 Illness, unspecified: Secondary | ICD-10-CM | POA: Diagnosis not present

## 2022-09-21 NOTE — Progress Notes (Signed)
Assessment/Plan:   Dementia due to Alzheimer's disease  Diana Smith is a very pleasant 77 y.o. RH female  with a history of anxiety, depression, OCD, and dementia due to Alzheimer's disease as per neuropsychological evaluation in March 2024, seen today in follow up for memory loss. Patient is currently on memantine 10 mg bid, not interested on adding any other antidementia medications at this time.  Her mood is controlled by Psychiatry. She is not independent of her ADLs.     Follow up in  6 months. Continue memantine 10 mg twice daily as per PCP, side effects discussed Patient does not wish to add any other antidementia meds at this time Continue to control cardiovascular risk factors Follow-up with psychiatry    Subjective:    This patient is accompanied in the office by her husband  who supplements the history.  Previous records as well as any outside records available were reviewed prior to todays visit. Patient was last seen on 06/15/2022.    Any changes in memory since last visit?  "Some good and bad days "."I kinda quit crossword puzzles and word finding". [facility].  LTM is fair repeats oneself?  Endorsed Disoriented when walking into a room?  Patient denies   Leaving objects in unusual places? Loses the glasses .   Wandering behavior?  denies   Any personality changes since last visit?  denies   Any worsening depression?:  Not worse. She has some moments in which she will cry.  Hallucinations or paranoia?  denies   Seizures?    denies    Any sleep changes?  Denies vivid dreams, REM behavior or sleepwalking   Sleep apnea?   denies   Any hygiene concerns?    denies   Independent of bathing and dressing?  Endorsed  Does the patient needs help with medications?  Husband is in charge   Who is in charge of the finances?  Husband is in charge     Any changes in appetite?  denies     Patient have trouble swallowing?   denies   Does the patient cook?  Any kitchen accidents such as leaving the stove on? Patient denies   Any headaches?   denies   Chronic back pain  denies   Ambulates with difficulty?     denies   Recent falls or head injuries? denies     Unilateral weakness, numbness or tingling?    denies   Any tremors?  denies   Any anosmia?  Patient denies   Any incontinence of urine?  denies   Any bowel dysfunction?     denies      Patient lives with her husband  Does the patient drive?No longer drives     Neuropsych evaluation 09/10/2022 Briefly, results suggested severe impairment surrounding all aspects of learning and memory. Additional impairments were exhibited across processing speed, executive functioning, and phonemic fluency, while performance variability was exhibited across complex attention, semantic fluency, confrontation naming, and visuospatial abilities. Relative to her previous evaluation in November 2022, significant decline was exhibited. This was most prominent across delayed retrieval and recognition/consolidation aspects of memory. Significant decline was also noteworthy across processing speed, executive functioning, and receptive language. Regarding etiology, progressive cognitive decline suggests an underlying neurodegenerative illness. Unfortunately, notable memory decline in this aspect has occurred and her current patterns of impairment now align with the presence of this illness reasonably well. Across  memory testing, Ms. Lythgoe did not benefit from repeated exposure to new information, was fully amnestic (i.e., 0% retention) after brief delays, and consistently performed very poorly across yes/no recognition trials. Taken together, this suggests rapid forgetting and a profound storage impairment which are the hallmark characteristics of this illness across neurocognitive testing. Further variability/weakness across semantic fluency, confrontation naming, and cognitive flexibility  would follow typical disease progression. Overall, Alzheimer's disease represents the most likely cause for ongoing impairment and progressive decline.   PREVIOUS MEDICATIONS:   CURRENT MEDICATIONS:  Outpatient Encounter Medications as of 09/21/2022  Medication Sig   ALPRAZolam (XANAX) 0.25 MG tablet Take 0.125-0.25 mg by mouth See admin instructions. Take 1/2 tablet by mouth daily if needed take 1 tablet as needed for anxiety   Biotin 1000 MCG tablet Take 1,000 mcg by mouth daily.   calcium carbonate (OSCAL) 1500 (600 Ca) MG TABS tablet Take 1,500 mg by mouth daily with breakfast.   escitalopram (LEXAPRO) 5 MG tablet Take 5 mg by mouth daily.   Melatonin 10 MG CAPS Take 10 mg by mouth at bedtime.   memantine (NAMENDA) 10 MG tablet    omeprazole (PRILOSEC) 20 MG capsule Take 2 capsules (40 mg total) by mouth daily.   PARoxetine (PAXIL) 30 MG tablet Take 40 mg by mouth every morning.   Probiotic Product (PROBIOTIC DAILY PO) Take 1 Capful by mouth daily.   thiamine (VITAMIN B-1) 100 MG tablet Take 1 tablet (100 mg total) by mouth daily.   valsartan (DIOVAN) 40 MG tablet Take 1 tablet (40 mg total) by mouth daily.   vitamin B-12 (CYANOCOBALAMIN) 500 MCG tablet Take 500 mcg by mouth daily.   ondansetron (ZOFRAN-ODT) 8 MG disintegrating tablet Take 1 tablet (8 mg total) by mouth every 8 (eight) hours as needed for nausea or vomiting. (Patient not taking: Reported on 09/21/2022)   No facility-administered encounter medications on file as of 09/21/2022.        No data to display            01/08/2021    9:00 AM  Montreal Cognitive Assessment   Visuospatial/ Executive (0/5) 4  Naming (0/3) 2  Attention: Read list of digits (0/2) 1  Attention: Read list of letters (0/1) 0  Attention: Serial 7 subtraction starting at 100 (0/3) 1  Language: Repeat phrase (0/2) 0  Language : Fluency (0/1) 0  Abstraction (0/2) 2  Delayed Recall (0/5) 0  Orientation (0/6) 6  Total 16  Adjusted Score (based  on education) 17    Objective:     PHYSICAL EXAMINATION:    VITALS:   Vitals:   09/21/22 1452  BP: (!) 152/90  Pulse: 79  SpO2: 97%  Weight: 169 lb (76.7 kg)  Height: 5' 5.5" (1.664 m)    GEN:  The patient appears stated age and is in NAD. HEENT:  Normocephalic, atraumatic.   Neurological examination:  General: NAD, well-groomed, appears stated age. Orientation: The patient is alert.  Anxious appearing. Oriented to person, place and not to date Cranial nerves: There is good facial symmetry.The speech is fluent and clear. No aphasia or dysarthria. Fund of knowledge is appropriate. Recent and remote memory are impaired. Attention and concentration are reduced.  Able to name objects and unable to repeat phrases.  Hearing is intact to conversational tone.   Sensation: Sensation is intact to light touch throughout Motor: Strength is at least antigravity x4. DTR's 2/4 in UE/LE     Movement examination: Tone: There is  normal tone in the UE/LE Abnormal movements:  no tremor.  No myoclonus.  No asterixis.   Coordination:  There is no decremation with RAM's. Normal finger to nose  Gait and Station: The patient has no difficulty arising out of a deep-seated chair without the use of the hands. The patient's stride length is good.  Gait is cautious and narrow.    Thank you for allowing [place] the opportunity to participate in the care of this nice patient. Please do not hesitate to contact us for any questions or concerns.   Total time spent on today's visit was 20 minutes dedicated to this patient today, preparing to see patient, examining the patient, ordering tests and/or medications and counseling the patient, documenting clinical information in the EHR or other health record, independently interpreting results and communicating results to the patient/family, discussing treatment and goals, answering patient's questions and coordinating care.  Cc:  Jonathon Jordan, MD  Sharene Butters 09/21/2022 3:13 PM

## 2022-09-21 NOTE — Patient Instructions (Signed)
It was a pleasure to see you today at our office.   Recommendations:  Follow up in  6 months Continue memantine as per your doctor Follow up with psychiatry    Whom to call:  Memory  decline, memory medications: Call our office [phone]   For psychiatric meds, mood meds: Please have your primary care physician manage these medications.     For assessment of decision of mental capacity and competency:  Call Dr. Anthoney Harada, geriatric psychiatrist at [phone]       If you have any severe symptoms of a stroke, or other severe issues such as confusion,severe chills or fever, etc call 911 or go to the ER as you may need to be evaluated further      RECOMMENDATIONS FOR ALL PATIENTS WITH MEMORY PROBLEMS: 1. Continue to exercise (Recommend 30 minutes of walking everyday, or 3 hours every week) 2. Increase social interactions - continue going to [place] and enjoy social gatherings with friends and family 3. Eat healthy, avoid fried foods and eat more fruits and vegetables 4. Maintain adequate blood pressure, blood sugar, and blood cholesterol level. Reducing the risk of stroke and cardiovascular disease also helps promoting better memory. 5. Avoid stressful situations. Live a simple life and avoid aggravations. Organize your time and prepare for the next day in anticipation. 6. Sleep well, avoid any interruptions of sleep and avoid any distractions in the bedroom that may interfere with adequate sleep quality 7. Avoid sugar, avoid sweets as there is a strong link between excessive sugar intake, diabetes, and cognitive impairment We discussed the Mediterranean diet, which has been shown to help patients reduce the risk of progressive memory disorders and reduces cardiovascular risk. This includes eating fish, eat fruits and green leafy vegetables, nuts like almonds and hazelnuts, walnuts, and also use olive oil. Avoid fast foods and fried foods as much as possible. Avoid sweets  and sugar as sugar use has been linked to worsening of memory function.  There is always a concern of gradual progression of memory problems. If this is the case, then we may need to adjust level of care according to patient needs. Support, both to the patient and caregiver, should then be put into place.    FALL PRECAUTIONS: Be cautious when walking. Scan the area for obstacles that may increase the risk of trips and falls. When getting up in the mornings, sit up at the edge of the bed for a few minutes before getting out of bed. Consider elevating the bed at the head end to avoid drop of blood pressure when getting up. Walk always in a well-lit room (use night lights in the walls). Avoid area rugs or power cords from appliances in the middle of the walkways. Use a walker or a cane if necessary and consider physical therapy for balance exercise. Get your eyesight checked regularly.  FINANCIAL OVERSIGHT: Supervision, especially oversight when making financial decisions or transactions is also recommended.  HOME SAFETY: Consider the safety of the kitchen when operating appliances like stoves, microwave oven, and blender. Consider having supervision and share cooking responsibilities until no longer able to participate in those. Accidents with firearms and other hazards in the house should be identified and addressed as well.   ABILITY TO BE LEFT ALONE: If patient is unable to contact 911 operator, consider using LifeLine, or when the need is there, arrange for someone to stay with patients. Smoking is a fire hazard, consider supervision or cessation. Risk of wandering should  be assessed by caregiver and if detected at any point, supervision and safe proof recommendations should be instituted.  MEDICATION SUPERVISION: Inability to self-administer medication needs to be constantly addressed. Implement a mechanism to ensure safe administration of the medications.   DRIVING: Regarding driving, in  patients with progressive memory problems, driving will be impaired. We advise to have someone else do the driving if trouble finding directions or if minor accidents are reported. Independent driving assessment is available to determine safety of driving.   If you are interested in the driving assessment, you can contact the following:  [employer] in [place]  [place] [phone]  [place]  [facility] [phone] or [phone]

## 2022-09-28 ENCOUNTER — Ambulatory Visit
Admission: RE | Admit: 2022-09-28 | Discharge: 2022-09-28 | Disposition: A | Discharge status: Home / Self Care | Payer: Medicare HMO | Source: Ambulatory Visit | Attending: Family Medicine | Admitting: Family Medicine

## 2022-09-28 DIAGNOSIS — Z1231 Encounter for screening mammogram for malignant neoplasm of breast: Secondary | ICD-10-CM

## 2022-10-01 DIAGNOSIS — F02818 Dementia in other diseases classified elsewhere, unspecified severity, with other behavioral disturbance: Secondary | ICD-10-CM | POA: Diagnosis not present

## 2022-10-01 DIAGNOSIS — G309 Alzheimer's disease, unspecified: Secondary | ICD-10-CM | POA: Diagnosis not present

## 2022-10-01 DIAGNOSIS — F411 Generalized anxiety disorder: Secondary | ICD-10-CM | POA: Diagnosis not present

## 2022-10-01 DIAGNOSIS — F330 Major depressive disorder, recurrent, mild: Secondary | ICD-10-CM | POA: Diagnosis not present

## 2022-10-06 DIAGNOSIS — M170 Bilateral primary osteoarthritis of knee: Secondary | ICD-10-CM | POA: Diagnosis not present

## 2022-12-15 ENCOUNTER — Ambulatory Visit: Payer: Medicare HMO | Admitting: Physician Assistant

## 2022-12-24 DIAGNOSIS — F411 Generalized anxiety disorder: Secondary | ICD-10-CM | POA: Diagnosis not present

## 2022-12-24 DIAGNOSIS — G309 Alzheimer's disease, unspecified: Secondary | ICD-10-CM | POA: Diagnosis not present

## 2022-12-24 DIAGNOSIS — F02818 Dementia in other diseases classified elsewhere, unspecified severity, with other behavioral disturbance: Secondary | ICD-10-CM | POA: Diagnosis not present

## 2022-12-24 DIAGNOSIS — F330 Major depressive disorder, recurrent, mild: Secondary | ICD-10-CM | POA: Diagnosis not present

## 2022-12-26 ENCOUNTER — Encounter (HOSPITAL_COMMUNITY): Payer: Self-pay | Admitting: Emergency Medicine

## 2022-12-26 ENCOUNTER — Emergency Department (HOSPITAL_COMMUNITY): Payer: Medicare HMO

## 2022-12-26 ENCOUNTER — Observation Stay (HOSPITAL_COMMUNITY)
Admission: EM | Admit: 2022-12-26 | Discharge: 2022-12-27 | Disposition: A | Discharge status: Home / Self Care | Payer: Medicare HMO | Attending: Family Medicine | Admitting: Family Medicine

## 2022-12-26 ENCOUNTER — Other Ambulatory Visit: Payer: Self-pay

## 2022-12-26 DIAGNOSIS — Z8616 Personal history of COVID-19: Secondary | ICD-10-CM | POA: Diagnosis not present

## 2022-12-26 DIAGNOSIS — I10 Essential (primary) hypertension: Secondary | ICD-10-CM | POA: Diagnosis not present

## 2022-12-26 DIAGNOSIS — G4700 Insomnia, unspecified: Secondary | ICD-10-CM | POA: Diagnosis present

## 2022-12-26 DIAGNOSIS — R2681 Unsteadiness on feet: Secondary | ICD-10-CM | POA: Insufficient documentation

## 2022-12-26 DIAGNOSIS — Z79899 Other long term (current) drug therapy: Secondary | ICD-10-CM | POA: Diagnosis not present

## 2022-12-26 DIAGNOSIS — M6281 Muscle weakness (generalized): Secondary | ICD-10-CM | POA: Insufficient documentation

## 2022-12-26 DIAGNOSIS — F329 Major depressive disorder, single episode, unspecified: Secondary | ICD-10-CM | POA: Diagnosis present

## 2022-12-26 DIAGNOSIS — R2689 Other abnormalities of gait and mobility: Secondary | ICD-10-CM | POA: Diagnosis not present

## 2022-12-26 DIAGNOSIS — I679 Cerebrovascular disease, unspecified: Secondary | ICD-10-CM | POA: Diagnosis present

## 2022-12-26 DIAGNOSIS — E538 Deficiency of other specified B group vitamins: Secondary | ICD-10-CM | POA: Diagnosis present

## 2022-12-26 DIAGNOSIS — I639 Cerebral infarction, unspecified: Secondary | ICD-10-CM | POA: Diagnosis not present

## 2022-12-26 DIAGNOSIS — F411 Generalized anxiety disorder: Secondary | ICD-10-CM | POA: Diagnosis present

## 2022-12-26 DIAGNOSIS — R410 Disorientation, unspecified: Secondary | ICD-10-CM | POA: Diagnosis not present

## 2022-12-26 DIAGNOSIS — G459 Transient cerebral ischemic attack, unspecified: Secondary | ICD-10-CM | POA: Diagnosis present

## 2022-12-26 DIAGNOSIS — F0390 Unspecified dementia without behavioral disturbance: Secondary | ICD-10-CM | POA: Diagnosis not present

## 2022-12-26 DIAGNOSIS — E785 Hyperlipidemia, unspecified: Secondary | ICD-10-CM | POA: Diagnosis present

## 2022-12-26 DIAGNOSIS — R4182 Altered mental status, unspecified: Secondary | ICD-10-CM | POA: Diagnosis not present

## 2022-12-26 DIAGNOSIS — F0280 Dementia in other diseases classified elsewhere without behavioral disturbance: Secondary | ICD-10-CM | POA: Diagnosis present

## 2022-12-26 DIAGNOSIS — I517 Cardiomegaly: Secondary | ICD-10-CM | POA: Diagnosis not present

## 2022-12-26 DIAGNOSIS — R0602 Shortness of breath: Secondary | ICD-10-CM | POA: Diagnosis not present

## 2022-12-26 DIAGNOSIS — R41841 Cognitive communication deficit: Secondary | ICD-10-CM | POA: Insufficient documentation

## 2022-12-26 DIAGNOSIS — R42 Dizziness and giddiness: Secondary | ICD-10-CM | POA: Diagnosis not present

## 2022-12-26 DIAGNOSIS — K219 Gastro-esophageal reflux disease without esophagitis: Secondary | ICD-10-CM | POA: Diagnosis present

## 2022-12-26 LAB — URINALYSIS, ROUTINE W REFLEX MICROSCOPIC
Bacteria, UA: NONE SEEN
Bilirubin Urine: NEGATIVE
Glucose, UA: NEGATIVE mg/dL
Hgb urine dipstick: NEGATIVE
Ketones, ur: NEGATIVE mg/dL
Nitrite: NEGATIVE
Protein, ur: NEGATIVE mg/dL
Specific Gravity, Urine: 1.01 (ref 1.005–1.030)
pH: 8 (ref 5.0–8.0)

## 2022-12-26 LAB — CBC WITH DIFFERENTIAL/PLATELET
Abs Immature Granulocytes: 0.03 10*3/uL (ref 0.00–0.07)
Basophils Absolute: 0 10*3/uL (ref 0.0–0.1)
Basophils Relative: 0 %
Eosinophils Absolute: 0 10*3/uL (ref 0.0–0.5)
Eosinophils Relative: 1 %
HCT: 39 % (ref 36.0–46.0)
Hemoglobin: 12.7 g/dL (ref 12.0–15.0)
Immature Granulocytes: 0 %
Lymphocytes Relative: 26 %
Lymphs Abs: 2.1 10*3/uL (ref 0.7–4.0)
MCH: 31.8 pg (ref 26.0–34.0)
MCHC: 32.6 g/dL (ref 30.0–36.0)
MCV: 97.5 fL (ref 80.0–100.0)
Monocytes Absolute: 0.6 10*3/uL (ref 0.1–1.0)
Monocytes Relative: 8 %
Neutro Abs: 5.2 10*3/uL (ref 1.7–7.7)
Neutrophils Relative %: 65 %
Platelets: 245 10*3/uL (ref 150–400)
RBC: 4 MIL/uL (ref 3.87–5.11)
RDW: 13.5 % (ref 11.5–15.5)
WBC: 7.9 10*3/uL (ref 4.0–10.5)
nRBC: 0 % (ref 0.0–0.2)

## 2022-12-26 LAB — VITAMIN B12: Vitamin B-12: 4548 pg/mL — ABNORMAL HIGH (ref 180–914)

## 2022-12-26 LAB — COMPREHENSIVE METABOLIC PANEL
ALT: 18 U/L (ref 0–44)
AST: 21 U/L (ref 15–41)
Albumin: 3.6 g/dL (ref 3.5–5.0)
Alkaline Phosphatase: 105 U/L (ref 38–126)
Anion gap: 5 (ref 5–15)
BUN: 13 mg/dL (ref 8–23)
CO2: 25 mmol/L (ref 22–32)
Calcium: 8.8 mg/dL — ABNORMAL LOW (ref 8.9–10.3)
Chloride: 107 mmol/L (ref 98–111)
Creatinine, Ser: 0.71 mg/dL (ref 0.44–1.00)
GFR, Estimated: >60 mL/min (ref >60)
Glucose, Bld: 117 mg/dL — ABNORMAL HIGH (ref 70–99)
Potassium: 4 mmol/L (ref 3.5–5.1)
Sodium: 137 mmol/L (ref 135–145)
Total Bilirubin: 0.7 mg/dL (ref 0.3–1.2)
Total Protein: 6.9 g/dL (ref 6.5–8.1)

## 2022-12-26 MED ORDER — STROKE: EARLY STAGES OF RECOVERY BOOK
Freq: Once | Status: AC
  Filled 2022-12-26: qty 1

## 2022-12-26 MED ORDER — ROSUVASTATIN CALCIUM 10 MG PO TABS
5.0000 mg | ORAL_TABLET | ORAL | Status: DC
  Administered 2022-12-27: 5 mg via ORAL
  Filled 2022-12-26: qty 1

## 2022-12-26 MED ORDER — CALCIUM CARBONATE ANTACID 500 MG PO CHEW
1500.0000 mg | CHEWABLE_TABLET | Freq: Every day | ORAL | Status: DC
  Administered 2022-12-27: 1500 mg via ORAL
  Filled 2022-12-26: qty 8

## 2022-12-26 MED ORDER — ONDANSETRON HCL 4 MG PO TABS: 4.0000 mg | ORAL_TABLET | Freq: Four times a day (QID) | ORAL | Status: DC | PRN

## 2022-12-26 MED ORDER — ALPRAZOLAM 0.25 MG PO TABS
0.1250 mg | ORAL_TABLET | Freq: Every day | ORAL | Status: DC | PRN
  Administered 2022-12-27: 0.25 mg via ORAL
  Filled 2022-12-26: qty 1

## 2022-12-26 MED ORDER — ACETAMINOPHEN 650 MG RE SUPP: 650.0000 mg | Freq: Four times a day (QID) | RECTAL | Status: DC | PRN

## 2022-12-26 MED ORDER — SODIUM CHLORIDE 0.9 % IV BOLUS
1000.0000 mL | Freq: Once | INTRAVENOUS | Status: AC
  Administered 2022-12-26: 1000 mL via INTRAVENOUS

## 2022-12-26 MED ORDER — BIOTIN 1000 MCG PO TABS: 1000.0000 ug | ORAL_TABLET | Freq: Every day | ORAL | Status: DC

## 2022-12-26 MED ORDER — THIAMINE MONONITRATE 100 MG PO TABS
100.0000 mg | ORAL_TABLET | Freq: Every day | ORAL | Status: DC
  Administered 2022-12-26 – 2022-12-27 (×2): 100 mg via ORAL
  Filled 2022-12-26 (×2): qty 1

## 2022-12-26 MED ORDER — METOPROLOL TARTRATE 5 MG/5ML IV SOLN: 5.0000 mg | Freq: Four times a day (QID) | INTRAVENOUS | Status: DC | PRN

## 2022-12-26 MED ORDER — ENOXAPARIN SODIUM 40 MG/0.4ML IJ SOSY
40.0000 mg | PREFILLED_SYRINGE | INTRAMUSCULAR | Status: DC
  Administered 2022-12-26: 40 mg via SUBCUTANEOUS
  Filled 2022-12-26 (×2): qty 0.4

## 2022-12-26 MED ORDER — ESCITALOPRAM OXALATE 10 MG PO TABS
10.0000 mg | ORAL_TABLET | Freq: Every day | ORAL | Status: DC
  Administered 2022-12-26 – 2022-12-27 (×2): 10 mg via ORAL
  Filled 2022-12-26 (×2): qty 1

## 2022-12-26 MED ORDER — MELATONIN 3 MG PO TABS
9.0000 mg | ORAL_TABLET | Freq: Every day | ORAL | Status: DC
  Administered 2022-12-26: 9 mg via ORAL
  Filled 2022-12-26: qty 3

## 2022-12-26 MED ORDER — ONDANSETRON HCL 4 MG/2ML IJ SOLN: 4.0000 mg | Freq: Four times a day (QID) | INTRAMUSCULAR | Status: DC | PRN

## 2022-12-26 MED ORDER — IRBESARTAN 75 MG PO TABS
75.0000 mg | ORAL_TABLET | Freq: Every day | ORAL | Status: DC
  Administered 2022-12-26 – 2022-12-27 (×2): 75 mg via ORAL
  Filled 2022-12-26 (×2): qty 1

## 2022-12-26 MED ORDER — PANTOPRAZOLE SODIUM 40 MG PO TBEC
40.0000 mg | DELAYED_RELEASE_TABLET | Freq: Every day | ORAL | Status: DC
  Administered 2022-12-27: 40 mg via ORAL
  Filled 2022-12-26 (×2): qty 1

## 2022-12-26 MED ORDER — RISAQUAD PO CAPS
1.0000 | ORAL_CAPSULE | Freq: Every day | ORAL | Status: DC
  Administered 2022-12-26 – 2022-12-27 (×2): 1 via ORAL
  Filled 2022-12-26 (×2): qty 1

## 2022-12-26 MED ORDER — VITAMIN B-12 1000 MCG PO TABS
500.0000 ug | ORAL_TABLET | Freq: Every day | ORAL | Status: DC
  Administered 2022-12-26 – 2022-12-27 (×2): 500 ug via ORAL
  Filled 2022-12-26 (×2): qty 1

## 2022-12-26 MED ORDER — ASPIRIN 81 MG PO TBEC
81.0000 mg | DELAYED_RELEASE_TABLET | Freq: Every day | ORAL | Status: DC
  Administered 2022-12-26 – 2022-12-27 (×2): 81 mg via ORAL
  Filled 2022-12-26 (×2): qty 1

## 2022-12-26 MED ORDER — PAROXETINE HCL 20 MG PO TABS
40.0000 mg | ORAL_TABLET | Freq: Every day | ORAL | Status: DC
  Administered 2022-12-27: 40 mg via ORAL
  Filled 2022-12-26 (×2): qty 2

## 2022-12-26 MED ORDER — MEMANTINE HCL 10 MG PO TABS
10.0000 mg | ORAL_TABLET | Freq: Every day | ORAL | Status: DC
  Administered 2022-12-26 – 2022-12-27 (×2): 10 mg via ORAL
  Filled 2022-12-26 (×2): qty 1

## 2022-12-26 MED ORDER — POLYETHYLENE GLYCOL 3350 17 G PO PACK: 17.0000 g | PACK | Freq: Every day | ORAL | Status: DC | PRN

## 2022-12-26 MED ORDER — ACETAMINOPHEN 325 MG PO TABS: 650.0000 mg | ORAL_TABLET | Freq: Four times a day (QID) | ORAL | Status: DC | PRN

## 2022-12-26 NOTE — Assessment & Plan Note (Signed)
BP is fairly well controlled on Diovan

## 2022-12-26 NOTE — Assessment & Plan Note (Signed)
Last cholesterol in system, total cholesterol was > 200, LDL > 150

## 2022-12-26 NOTE — Plan of Care (Signed)
On call note  Call from Dr. Estell Harpin at [facility].  Patient with history of dementia who went to bed last night in her usual state of health, woke up more confused this morning.  Husband reports that she gets this way when she has a UTI.  Her labs were checked in the [facility] emergency room and are unremarkable.  CT head negative.  UA not impressive.  Husband reports that she still remains confused. Non focal per EDP. No aphasia. Not a candidate for thrombolysis.  Recs: Obs at [facility].  This still may all be related to a small UTI given mildly abnormal urinalysis. Can get EEG and MRI in the AM if remains confused.   Also feel free to call the on-call neurologist for teleconsult if any of the above are abnormal or if she remains confused.  -- Milon Dikes, MD Neurologist [facility] Pager: [phone]

## 2022-12-26 NOTE — Assessment & Plan Note (Addendum)
Need clarification on which medications patient is on.

## 2022-12-26 NOTE — Assessment & Plan Note (Signed)
Continue Melatonin 

## 2022-12-26 NOTE — Assessment & Plan Note (Signed)
Continue PPI ?

## 2022-12-26 NOTE — Assessment & Plan Note (Signed)
Continue Oral B 12 repletion  Check level

## 2022-12-26 NOTE — Progress Notes (Addendum)
Alert and oriented to name, confused on year and month but knew was in hospital.  Able to interact and follow commands for stroke eval and showed no deficits except a little trouble coming up with name of a few items on handout.    Passed swallow screen on admit, plus husband had brought food in ED which patient had eaten.  Swallowed pills with no difficulty.  On admission ambulated to bathroom with standby.  Ambulates independently at home Husband at bedside and will spend the night.

## 2022-12-26 NOTE — Assessment & Plan Note (Addendum)
Unclear - increased confusion - ? Polypharmacy Per neurology - MRI in am Neurology consult in am Telemetry ASA Recent ECHO 10/23 Lipid panel am - consider addition of statin, in care everywhere appears to by on crestor 3x/week

## 2022-12-26 NOTE — Assessment & Plan Note (Signed)
--  Continue ARB 

## 2022-12-26 NOTE — H&P (Signed)
History and Physical    Patient: Diana Smith WGN:[id number] DOB: June 27, 1946 DOA: 12/26/2022 DOS: the patient was seen and examined on 12/26/2022 PCP: Mila Palmer, MD  Patient coming from: Home  Chief Complaint:  Chief Complaint  Patient presents with   Altered Mental Status   HPI: Diana Smith is a 77 y.o. female with medical history significant of GERD, HTN, Anxiety, MDD, Insomnia and dementia brought in to the hospital today by her husband who reports increased confusion. Hospitalized for similar in 10/23 and had a UTI. Labs and w/u in ED were normal. Neurology called by EDP who asked for admission, reported they would see the patient in the am, and asked for MRI in the am.   Review of Systems: unable to review all systems due to the inability of the patient to answer questions.  Past Medical History:  Diagnosis Date   Abdominal pain 04/24/2022   Actinic keratosis    Allergic rhinitis due to pollen    Cerebrovascular disease    Dementia due to Alzheimer's disease 09/10/2022   Essential hypertension 04/24/2022   Frequent urinary tract infections    Gastro-esophageal reflux disease without esophagitis    Generalized anxiety disorder    Heartburn    Hematochezia    History of COVID-19    Hyperglycemia    Hyperlipidemia    Hypoalbuminemia due to protein-calorie malnutrition 04/24/2022   Insomnia 04/24/2022   Major depressive disorder    Nonalcoholic steatohepatitis (NASH)    Osteoarthritis of left knee 03/21/2022   Osteoarthritis of right knee 01/25/2021   Overactive bladder    Personal history of colonic polyps    Prediabetes    Pulmonary nodule 04/24/2022   Pure hypercholesterolemia    Right bundle branch block    Rosacea    Sciatica, right side    Slow transit constipation    Starvation ketoacidosis 04/24/2022   UTI (urinary tract infection) 04/24/2022   Vitamin B12 deficiency (non anemic)    Vitamin D deficiency    Past Surgical History:  Procedure  Laterality Date   HYSTEROTOMY     MASS EXCISION  10/08/2011   Procedure: MINOR EXCISION OF MASS;  Surgeon: Wyn Forster., MD;  Location: [facility];  Service: Orthopedics;  Laterality: Left;  excisional biopsy dorsum left long finger MCP joint   Social History:  reports that she has never smoked. She has never used smokeless tobacco. She reports that she does not drink alcohol and does not use drugs.  Allergies  Allergen Reactions   Dynacin [Minocycline Hcl]    Gatifloxacin Other (See Comments)   Lorabid [Loracarbef]    Minocycline Other (See Comments)    Family History  Problem Relation Age of Onset   Dementia Father    Memory loss Father    Breast cancer Neg Hx     Prior to Admission medications   Medication Sig Start Date End Date Taking? Authorizing Provider  ALPRAZolam (XANAX) 0.25 MG tablet Take 0.125-0.25 mg by mouth See admin instructions. Take 1/2 tablet by mouth daily if needed take 1 tablet as needed for anxiety 12/23/20   [provider]  Biotin 1000 MCG tablet Take 1,000 mcg by mouth daily.    [provider]  calcium carbonate (OSCAL) 1500 (600 Ca) MG TABS tablet Take 1,500 mg by mouth daily with breakfast.    [provider]  escitalopram (LEXAPRO) 5 MG tablet Take 5 mg by mouth daily. 04/12/22   [provider]  Melatonin 10 MG CAPS Take 10 mg by mouth at bedtime.    [provider]  memantine (NAMENDA) 10 MG tablet  08/06/22   [provider]  omeprazole (PRILOSEC) 20 MG capsule Take 2 capsules (40 mg total) by mouth daily. 04/26/22   Vassie Loll, MD  ondansetron (ZOFRAN-ODT) 8 MG disintegrating tablet Take 1 tablet (8 mg total) by mouth every 8 (eight) hours as needed for nausea or vomiting. Patient not taking: Reported on 09/21/2022 04/26/22   Vassie Loll, MD  PARoxetine (PAXIL) 30 MG tablet Take 40 mg by mouth every morning.    [provider]  Probiotic Product (PROBIOTIC DAILY  PO) Take 1 Capful by mouth daily.    [provider]  thiamine (VITAMIN B-1) 100 MG tablet Take 1 tablet (100 mg total) by mouth daily. 04/27/22   Vassie Loll, MD  valsartan (DIOVAN) 40 MG tablet Take 1 tablet (40 mg total) by mouth daily. 04/26/22   Vassie Loll, MD  vitamin B-12 (CYANOCOBALAMIN) 500 MCG tablet Take 500 mcg by mouth daily.    [provider]    Physical Exam: Vitals:   12/26/22 1530 12/26/22 1545 12/26/22 1600 12/26/22 1615  BP:   133/60   Pulse: 71 69 68   Resp: 14 10 14 13   Temp:   98.4 F (36.9 C)   TempSrc:   Oral   SpO2: 96% 97% 97%   Weight:      Height:       Physical Examination: General appearance - alert, well appearing, and in no distress Mental status - confused, pleasant Chest - clear to auscultation, no wheezes, rales or rhonchi, symmetric air entry Heart - normal rate, regular rhythm, normal S1, S2, no murmurs, rubs, clicks or gallops Abdomen - soft, nontender, nondistended, no masses or organomegaly Extremities - peripheral pulses normal, no pedal edema, no clubbing or cyanosis  Data Reviewed: Results for orders placed or performed during the hospital encounter of 12/26/22 (from the past 24 hour(s))  CBC with Differential     Status: None   Collection Time: 12/26/22 12:10 PM  Result Value Ref Range   WBC 7.9 4.0 - 10.5 K/uL   RBC 4.00 3.87 - 5.11 MIL/uL   Hemoglobin 12.7 12.0 - 15.0 g/dL   HCT 16.1 09.6 - 04.5 %   MCV 97.5 80.0 - 100.0 fL   MCH 31.8 26.0 - 34.0 pg   MCHC 32.6 30.0 - 36.0 g/dL   RDW 40.9 81.1 - 91.4 %   Platelets 245 150 - 400 K/uL   nRBC 0.0 0.0 - 0.2 %   Neutrophils Relative % 65 %   Neutro Abs 5.2 1.7 - 7.7 K/uL   Lymphocytes Relative 26 %   Lymphs Abs 2.1 0.7 - 4.0 K/uL   Monocytes Relative 8 %   Monocytes Absolute 0.6 0.1 - 1.0 K/uL   Eosinophils Relative 1 %   Eosinophils Absolute 0.0 0.0 - 0.5 K/uL   Basophils Relative 0 %   Basophils Absolute 0.0 0.0 - 0.1 K/uL   Immature Granulocytes 0  %   Abs Immature Granulocytes 0.03 0.00 - 0.07 K/uL  Comprehensive metabolic panel     Status: Abnormal   Collection Time: 12/26/22 12:10 PM  Result Value Ref Range   Sodium 137 135 - 145 mmol/L   Potassium 4.0 3.5 - 5.1 mmol/L   Chloride 107 98 - 111 mmol/L   CO2 25 22 - 32 mmol/L   Glucose, Bld 117 (H) 70 -  99 mg/dL   BUN 13 8 - 23 mg/dL   Creatinine, Ser 9.14 0.44 - 1.00 mg/dL   Calcium 8.8 (L) 8.9 - 10.3 mg/dL   Total Protein 6.9 6.5 - 8.1 g/dL   Albumin 3.6 3.5 - 5.0 g/dL   AST 21 15 - 41 U/L   ALT 18 0 - 44 U/L   Alkaline Phosphatase 105 38 - 126 U/L   Total Bilirubin 0.7 0.3 - 1.2 mg/dL   GFR, Estimated >78 >29 mL/min   Anion gap 5 5 - 15  Urinalysis, Routine w reflex microscopic -Urine, Clean Catch     Status: Abnormal   Collection Time: 12/26/22 12:10 PM  Result Value Ref Range   Color, Urine YELLOW YELLOW   APPearance CLEAR CLEAR   Specific Gravity, Urine 1.010 1.005 - 1.030   pH 8.0 5.0 - 8.0   Glucose, UA NEGATIVE NEGATIVE mg/dL   Hgb urine dipstick NEGATIVE NEGATIVE   Bilirubin Urine NEGATIVE NEGATIVE   Ketones, ur NEGATIVE NEGATIVE mg/dL   Protein, ur NEGATIVE NEGATIVE mg/dL   Nitrite NEGATIVE NEGATIVE   Leukocytes,Ua MODERATE (A) NEGATIVE   RBC / HPF 0-5 0 - 5 RBC/hpf   WBC, UA 0-5 0 - 5 WBC/hpf   Bacteria, UA NONE SEEN NONE SEEN   Squamous Epithelial / HPF 0-5 0 - 5 /HPF   DG Chest Port 1 View  Result Date: 12/26/2022 CLINICAL DATA:  Shortness of breath, dizziness EXAM: PORTABLE CHEST 1 VIEW COMPARISON:  None Available. FINDINGS: Transverse diameter of heart is increased. There are no signs of pulmonary edema or focal pulmonary consolidation. There is no pleural effusion or pneumothorax. IMPRESSION: Cardiomegaly. There are no signs of pulmonary edema or focal pulmonary consolidation. Electronically Signed   By: Ernie Avena M.D.   On: 12/26/2022 13:19   CT Head Wo Contrast  Result Date: 12/26/2022 CLINICAL DATA:  Delirium with dizziness. EXAM: CT  HEAD WITHOUT CONTRAST TECHNIQUE: Contiguous axial images were obtained from the base of the skull through the vertex without intravenous contrast. RADIATION DOSE REDUCTION: This exam was performed according to the departmental dose-optimization program which includes automated exposure control, adjustment of the mA and/or kV according to patient size and/or use of iterative reconstruction technique. COMPARISON:  MRI brain 01/18/2021 FINDINGS: Brain: Ventricles, cisterns and other CSF spaces are normal. There is mild chronic ischemic microvascular disease. No mass, mass effect, shift of midline structures or acute hemorrhage. No evidence of acute infarction. Vascular: No hyperdense vessel or unexpected calcification. Skull: Normal. Negative for fracture or focal lesion. Sinuses/Orbits: No acute finding. Other: None. IMPRESSION: 1. No acute findings. 2. Mild chronic ischemic microvascular disease. Electronically Signed   By: Elberta Fortis M.D.   On: 12/26/2022 13:07     EKG - sinus rhythm, RBBB, LAFB  Assessment and Plan: * TIA (transient ischemic attack) ?, with increased confusion and baseline dementia. ? Polypharmacy, please review med list with pharmacy and family? W/u thus far negative. ASA Telemetry AM ECHO AM MRI Neurology consult in am Lipid panel - on statin.  Check urine culture  Dementia due to Alzheimer's disease Continue Namenda Prescribed Aracept and patient's husband is not going to allow her to take it, as she had side effects from the last time she was on it.  Insomnia Continue Melatonin  Essential hypertension Continue ARB  Vitamin B12 deficiency (non anemic) Continue oral repletion Check level  Major depressive disorder Continue Paxil and lexapro  Hyperlipidemia Last labs which are scanned show total cholesterol >  200 and LDL > 150 Crestor 3x/wk  Generalized anxiety disorder On Paxil On Lexapro Not sure why on both? Prn Ativan ordered  Gastro-esophageal  reflux disease without esophagitis Continue PPI  Cerebrovascular disease Found on CT--with microvascular changes On Statin    Advance Care Planning:   Code Status: Full Code has Living will, and the husband was not sure what it said and will try to bring it in the morning. Consider palliative care consult.  Patient lives with husband.   Consults: Neurology, called by EDP  Family Communication: Called husband and discussed with him.  Severity of Illness: The appropriate patient status for this patient is OBSERVATION. Observation status is judged to be reasonable and necessary in order to provide the required intensity of service to ensure the patient's safety. The patient's presenting symptoms, physical exam findings, and initial radiographic and laboratory data in the context of their medical condition is felt to place them at decreased risk for further clinical deterioration. Furthermore, it is anticipated that the patient will be medically stable for discharge from the hospital within 2 midnights of admission.   Author: Reva Bores, MD 12/26/2022 4:35 PM  For on call review [email or web address].

## 2022-12-26 NOTE — Assessment & Plan Note (Signed)
Continue oral repletion Check level

## 2022-12-26 NOTE — Hospital Course (Signed)
Diana Smith is a 77 y.o. female with medical history significant of GERD, HTN, Anxiety, MDD, Insomnia and dementia brought in to the hospital today by her husband who reports increased confusion. Hospitalized for similar in 10/23 and had a UTI. Labs and w/u in ED were normal. Neurology called by EDP who asked for admission, reported they would see the patient in the am, and asked for MRI in the am.

## 2022-12-26 NOTE — Assessment & Plan Note (Addendum)
Continue Paxil and lexapro

## 2022-12-26 NOTE — Assessment & Plan Note (Addendum)
Last labs which are scanned show total cholesterol > 200 and LDL > 150 Crestor 3x/wk

## 2022-12-26 NOTE — ED Triage Notes (Signed)
Pt ambulatory to triage with c/o dizziness, and "does not feel good".  Pt's husband states that she came in February for similar symptoms and was dehydrated and had a UTI.  Pt in NAD  at this time.

## 2022-12-26 NOTE — Assessment & Plan Note (Addendum)
Continue Namenda Prescribed Aracept and patient's husband is not going to allow her to take it, as she had side effects from the last time she was on it.

## 2022-12-26 NOTE — Assessment & Plan Note (Addendum)
On Paxil On Lexapro Not sure why on both? Prn Ativan ordered

## 2022-12-26 NOTE — Assessment & Plan Note (Addendum)
Continue Namenda Aricept - noted in Care Everywhere, but not on--if taking would continue this?

## 2022-12-26 NOTE — Assessment & Plan Note (Addendum)
?,   with increased confusion and baseline dementia. ? Polypharmacy, please review med list with pharmacy and family? W/u thus far negative. ASA Telemetry AM ECHO AM MRI Neurology consult in am Lipid panel - on statin.  Check urine culture

## 2022-12-26 NOTE — Assessment & Plan Note (Addendum)
Found on CT--with microvascular changes On Statin

## 2022-12-26 NOTE — ED Notes (Signed)
ED TO INPATIENT HANDOFF REPORT  ED Nurse Name and Phone #: [phone]   S Name/Age/Gender Diana Smith 77 y.o. female Room/Bed: APA08/APA08  Code Status   Code Status: Prior  Home/SNF/Other Home Patient oriented to: self and place Is this baseline? Yes   Triage Complete: Triage complete  Chief Complaint TIA (transient ischemic attack) [G45.9]  Triage Note Pt ambulatory to triage with c/o dizziness, and "does not feel good".  Pt's husband states that she came in February for similar symptoms and was dehydrated and had a UTI.  Pt in NAD  at this time.   Allergies Allergies  Allergen Reactions   Dynacin [Minocycline Hcl]    Lorabid [Loracarbef]    Minocycline Other (See Comments)    Level of Care/Admitting Diagnosis ED Disposition     ED Disposition  Admit   Condition  --   Comment  Hospital Area: [facility] [[id number]]  Level of Care: Telemetry [5]  Covid Evaluation: Asymptomatic - no recent exposure (last 10 days) testing not required  Diagnosis: TIA (transient ischemic attack) [244010]  Admitting Physician: Samara Snide  Attending Physician: Samara Snide          B Medical/Surgery History Past Medical History:  Diagnosis Date   Abdominal pain 04/24/2022   Actinic keratosis    Allergic rhinitis due to pollen    Cerebrovascular disease    Dementia due to Alzheimer's disease 09/10/2022   Essential hypertension 04/24/2022   Frequent urinary tract infections    Gastro-esophageal reflux disease without esophagitis    Generalized anxiety disorder    Heartburn    Hematochezia    History of COVID-19    Hyperglycemia    Hyperlipidemia    Hypoalbuminemia due to protein-calorie malnutrition 04/24/2022   Insomnia 04/24/2022   Major depressive disorder    Nonalcoholic steatohepatitis (NASH)    Osteoarthritis of left knee 03/21/2022   Osteoarthritis of right knee 01/25/2021   Overactive bladder    Personal history of colonic  polyps    Prediabetes    Pulmonary nodule 04/24/2022   Pure hypercholesterolemia    Right bundle branch block    Rosacea    Sciatica, right side    Slow transit constipation    Starvation ketoacidosis 04/24/2022   UTI (urinary tract infection) 04/24/2022   Vitamin B12 deficiency (non anemic)    Vitamin D deficiency    Past Surgical History:  Procedure Laterality Date   HYSTEROTOMY     MASS EXCISION  10/08/2011   Procedure: MINOR EXCISION OF MASS;  Surgeon: Wyn Forster., MD;  Location: [facility];  Service: Orthopedics;  Laterality: Left;  excisional biopsy dorsum left long finger MCP joint     A IV Location/Drains/Wounds Patient Lines/Drains/Airways Status     Active Line/Drains/Airways     Name Placement date Placement time Site Days   Peripheral IV 12/26/22 20 G Anterior;Right Forearm 12/26/22  1215  Forearm  less than 1            Intake/Output Last 24 hours  Intake/Output Summary (Last 24 hours) at 12/26/2022 1615 Last data filed at 12/26/2022 1324 Gross per 24 hour  Intake 1000 ml  Output --  Net 1000 ml    Labs/Imaging Results for orders placed or performed during the hospital encounter of 12/26/22 (from the past 48 hour(s))  CBC with Differential     Status: None   Collection Time: 12/26/22 12:10 PM  Result Value Ref Range  WBC 7.9 4.0 - 10.5 K/uL   RBC 4.00 3.87 - 5.11 MIL/uL   Hemoglobin 12.7 12.0 - 15.0 g/dL   HCT 16.1 09.6 - 04.5 %   MCV 97.5 80.0 - 100.0 fL   MCH 31.8 26.0 - 34.0 pg   MCHC 32.6 30.0 - 36.0 g/dL   RDW 40.9 81.1 - 91.4 %   Platelets 245 150 - 400 K/uL   nRBC 0.0 0.0 - 0.2 %   Neutrophils Relative % 65 %   Neutro Abs 5.2 1.7 - 7.7 K/uL   Lymphocytes Relative 26 %   Lymphs Abs 2.1 0.7 - 4.0 K/uL   Monocytes Relative 8 %   Monocytes Absolute 0.6 0.1 - 1.0 K/uL   Eosinophils Relative 1 %   Eosinophils Absolute 0.0 0.0 - 0.5 K/uL   Basophils Relative 0 %   Basophils Absolute 0.0 0.0 - 0.1 K/uL   Immature  Granulocytes 0 %   Abs Immature Granulocytes 0.03 0.00 - 0.07 K/uL    Comment: Performed at [facility], [address] [place], [place] [address]  Comprehensive metabolic panel     Status: Abnormal   Collection Time: 12/26/22 12:10 PM  Result Value Ref Range   Sodium 137 135 - 145 mmol/L   Potassium 4.0 3.5 - 5.1 mmol/L   Chloride 107 98 - 111 mmol/L   CO2 25 22 - 32 mmol/L   Glucose, Bld 117 (H) 70 - 99 mg/dL    Comment: Glucose reference range applies only to samples taken after fasting for at least 8 hours.   BUN 13 8 - 23 mg/dL   Creatinine, Ser 6.21 0.44 - 1.00 mg/dL   Calcium 8.8 (L) 8.9 - 10.3 mg/dL   Total Protein 6.9 6.5 - 8.1 g/dL   Albumin 3.6 3.5 - 5.0 g/dL   AST 21 15 - 41 U/L   ALT 18 0 - 44 U/L   Alkaline Phosphatase 105 38 - 126 U/L   Total Bilirubin 0.7 0.3 - 1.2 mg/dL   GFR, Estimated >30 >86 mL/min    Comment: (NOTE) Calculated using the CKD-EPI Creatinine Equation (2021)    Anion gap 5 5 - 15    Comment: Performed at [facility], [address] [place], [place] [address]  Urinalysis, Routine w reflex microscopic -Urine, Clean Catch     Status: Abnormal   Collection Time: 12/26/22 12:10 PM  Result Value Ref Range   Color, Urine YELLOW YELLOW   APPearance CLEAR CLEAR   Specific Gravity, Urine 1.010 1.005 - 1.030   pH 8.0 5.0 - 8.0   Glucose, UA NEGATIVE NEGATIVE mg/dL   Hgb urine dipstick NEGATIVE NEGATIVE   Bilirubin Urine NEGATIVE NEGATIVE   Ketones, ur NEGATIVE NEGATIVE mg/dL   Protein, ur NEGATIVE NEGATIVE mg/dL   Nitrite NEGATIVE NEGATIVE   Leukocytes,Ua MODERATE (A) NEGATIVE   RBC / HPF 0-5 0 - 5 RBC/hpf   WBC, UA 0-5 0 - 5 WBC/hpf   Bacteria, UA NONE SEEN NONE SEEN   Squamous Epithelial / HPF 0-5 0 - 5 /HPF    Comment: Performed at [facility], [address] [place], [place] [address]   DG Chest Port 1 View  Result Date: 12/26/2022 CLINICAL DATA:  Shortness of breath, dizziness EXAM: PORTABLE CHEST 1 VIEW COMPARISON:  None Available.  FINDINGS: Transverse diameter of heart is increased. There are no signs of pulmonary edema or focal pulmonary consolidation. There is no pleural effusion or pneumothorax. IMPRESSION: Cardiomegaly. There are no signs  of pulmonary edema or focal pulmonary consolidation. Electronically Signed   By: Ernie Avena M.D.   On: 12/26/2022 13:19   CT Head Wo Contrast  Result Date: 12/26/2022 CLINICAL DATA:  Delirium with dizziness. EXAM: CT HEAD WITHOUT CONTRAST TECHNIQUE: Contiguous axial images were obtained from the base of the skull through the vertex without intravenous contrast. RADIATION DOSE REDUCTION: This exam was performed according to the departmental dose-optimization program which includes automated exposure control, adjustment of the mA and/or kV according to patient size and/or use of iterative reconstruction technique. COMPARISON:  MRI brain 01/18/2021 FINDINGS: Brain: Ventricles, cisterns and other CSF spaces are normal. There is mild chronic ischemic microvascular disease. No mass, mass effect, shift of midline structures or acute hemorrhage. No evidence of acute infarction. Vascular: No hyperdense vessel or unexpected calcification. Skull: Normal. Negative for fracture or focal lesion. Sinuses/Orbits: No acute finding. Other: None. IMPRESSION: 1. No acute findings. 2. Mild chronic ischemic microvascular disease. Electronically Signed   By: Elberta Fortis M.D.   On: 12/26/2022 13:07    Pending Labs Unresulted Labs (From admission, onward)    None       Vitals/Pain Today's Vitals   12/26/22 1330 12/26/22 1400 12/26/22 1511 12/26/22 1512  BP: (!) 169/84 (!) 149/68 134/71   Pulse: 68 67 86   Resp: (!) 21 (!) 26 16 17   Temp:   98.5 F (36.9 C)   TempSrc:   Oral   SpO2: 96% 96% 97% 98%  Weight:      Height:      PainSc:   0-No pain     Isolation Precautions No active isolations  Medications Medications  sodium chloride 0.9 % bolus 1,000 mL (0 mLs Intravenous Stopped  12/26/22 1324)    Mobility walks with person assist     Focused Assessments Neuro Assessment Handoff:  Swallow screen pass? Yes          Neuro Assessment: Exceptions to WDL Neuro Checks:      Has TPA been given? No If patient is a Neuro Trauma and patient is going to OR before floor call report to 4N Charge nurse: [phone] or [phone]   R Recommendations: See Admitting Provider Note  Report given to:   Additional Notes:

## 2022-12-26 NOTE — Assessment & Plan Note (Signed)
Seen on CT--nothing acute MRI in am ASA

## 2022-12-26 NOTE — Assessment & Plan Note (Signed)
Continue melatonin 

## 2022-12-26 NOTE — Assessment & Plan Note (Addendum)
Holding Lexapro Per patient records she is on both Lexapro and Paxil--need to confirm if taking both? Has prn Ativan

## 2022-12-27 ENCOUNTER — Observation Stay (HOSPITAL_COMMUNITY): Payer: Medicare HMO

## 2022-12-27 ENCOUNTER — Other Ambulatory Visit (HOSPITAL_COMMUNITY): Payer: Medicare HMO

## 2022-12-27 ENCOUNTER — Observation Stay (HOSPITAL_BASED_OUTPATIENT_CLINIC_OR_DEPARTMENT_OTHER): Payer: Medicare HMO

## 2022-12-27 ENCOUNTER — Other Ambulatory Visit (HOSPITAL_COMMUNITY): Payer: Self-pay | Admitting: *Deleted

## 2022-12-27 DIAGNOSIS — I639 Cerebral infarction, unspecified: Secondary | ICD-10-CM | POA: Diagnosis not present

## 2022-12-27 DIAGNOSIS — R410 Disorientation, unspecified: Secondary | ICD-10-CM | POA: Diagnosis not present

## 2022-12-27 DIAGNOSIS — G459 Transient cerebral ischemic attack, unspecified: Secondary | ICD-10-CM

## 2022-12-27 DIAGNOSIS — R42 Dizziness and giddiness: Secondary | ICD-10-CM | POA: Diagnosis not present

## 2022-12-27 LAB — ECHOCARDIOGRAM COMPLETE
AR max vel: 2.5 cm2
AV Area VTI: 2.44 cm2
AV Area mean vel: 2.52 cm2
AV Mean grad: 3 mmHg
AV Peak grad: 6.6 mmHg
Ao pk vel: 1.28 m/s
Area-P 1/2: 4.15 cm2
Height: 65 in
MV VTI: 4.19 cm2
S' Lateral: 2.8 cm
Weight: 2688 oz

## 2022-12-27 LAB — LIPID PANEL
Cholesterol: 168 mg/dL (ref 0–200)
HDL: 47 mg/dL (ref >40)
LDL Cholesterol: 98 mg/dL (ref 0–99)
Total CHOL/HDL Ratio: 3.6 RATIO
Triglycerides: 114 mg/dL (ref <150)
VLDL: 23 mg/dL (ref 0–40)

## 2022-12-27 MED ORDER — CLOPIDOGREL BISULFATE 75 MG PO TABS: 75.0000 mg | ORAL_TABLET | Freq: Every day | ORAL | 0 refills | Status: AC

## 2022-12-27 MED ORDER — ASPIRIN 81 MG PO TBEC: 81.0000 mg | DELAYED_RELEASE_TABLET | Freq: Every day | ORAL | 11 refills | Status: AC

## 2022-12-27 MED ORDER — ROSUVASTATIN CALCIUM 20 MG PO TABS: 20.0000 mg | ORAL_TABLET | Freq: Every day | ORAL | 11 refills | Status: DC

## 2022-12-27 MED ORDER — GADOBUTROL 1 MMOL/ML IV SOLN
7.5000 mL | Freq: Once | INTRAVENOUS | Status: AC | PRN
  Administered 2022-12-27: 7.5 mL via INTRAVENOUS

## 2022-12-27 NOTE — Discharge Instructions (Signed)
1)Please take Aspirin 81 mg daily along with Plavix 75 mg daily for 21 days then after that STOP the Plavix  and continue ONLY Aspirin 81 mg daily indefinitely--for secondary stroke Prevention   2)Increase Crestor (Rosuvastatin) to 20 mg daily for stroke prevention--  3)Follow up with Neurologist Dr. Pearlean Brownie in about 4 to 6 weeks  4)They will mail a 30-day event/heart monitor to your home address- to help look for irregular heartbeat (Atrial Fibrillation/Atrial Flutter-- please follow the instructions on the box and return it as advised

## 2022-12-27 NOTE — TOC CM/SW Note (Signed)
Transition of Care [facility]) - Inpatient Brief Assessment   Patient Details  Name: Diana Smith MRN: [id number] Date of Birth: 04/16/1946  Transition of Care [facility]) CM/SW Contact:    Elliot Gault, LCSW Phone Number: 12/27/2022, 10:16 AM   Clinical Narrative:  Transition of Care Department [facility]) has reviewed patient and no TOC needs have been identified at this time. We will continue to monitor patient advancement through interdisciplinary progression rounds. If new patient transition needs arise, please place a TOC consult.  Transition of Care Asessment: Insurance and Status: Insurance coverage has been reviewed Patient has primary care physician: Yes Home environment has been reviewed: from home with spouse Prior level of function:: independent Prior/Current Home Services: No current home services Social Determinants of Health Reivew: SDOH reviewed no interventions necessary Readmission risk has been reviewed: Yes Transition of care needs: no transition of care needs at this time

## 2022-12-27 NOTE — Evaluation (Signed)
Speech Language Pathology Evaluation Patient Details Name: AGDA CRESSWELL MRN: [id number] DOB: 11-29-1945 Today's Date: 12/27/2022 Time: 1236-1300 SLP Time Calculation (min) (ACUTE ONLY): 24 min  Problem List:  Patient Active Problem List   Diagnosis Date Noted   TIA (transient ischemic attack) 12/26/2022   Dementia due to Alzheimer's disease 09/10/2022   Abdominal pain 04/24/2022   Hypoalbuminemia due to protein-calorie malnutrition 04/24/2022   Pulmonary nodule 04/24/2022   Essential hypertension 04/24/2022   Insomnia 04/24/2022   Actinic keratosis    Allergic rhinitis due to pollen    Cerebrovascular disease    History of COVID-19    Gastro-esophageal reflux disease without esophagitis    Generalized anxiety disorder    Hyperglycemia    Hyperlipidemia    Major depressive disorder    Nonalcoholic steatohepatitis (NASH)    Overactive bladder    Personal history of colonic polyps    Prediabetes    Right bundle branch block    Rosacea    Sciatica, right side    Slow transit constipation    Vitamin B12 deficiency (non anemic)    Vitamin D deficiency    Osteoarthritis of right knee 01/25/2021   Past Medical History:  Past Medical History:  Diagnosis Date   Abdominal pain 04/24/2022   Actinic keratosis    Allergic rhinitis due to pollen    Cerebrovascular disease    Dementia due to Alzheimer's disease 09/10/2022   Essential hypertension 04/24/2022   Frequent urinary tract infections    Gastro-esophageal reflux disease without esophagitis    Generalized anxiety disorder    Heartburn    Hematochezia    History of COVID-19    Hyperglycemia    Hyperlipidemia    Hypoalbuminemia due to protein-calorie malnutrition 04/24/2022   Insomnia 04/24/2022   Major depressive disorder    Nonalcoholic steatohepatitis (NASH)    Osteoarthritis of left knee 03/21/2022   Osteoarthritis of right knee 01/25/2021   Overactive bladder    Personal history of colonic polyps     Prediabetes    Pulmonary nodule 04/24/2022   Pure hypercholesterolemia    Right bundle branch block    Rosacea    Sciatica, right side    Slow transit constipation    Starvation ketoacidosis 04/24/2022   UTI (urinary tract infection) 04/24/2022   Vitamin B12 deficiency (non anemic)    Vitamin D deficiency    Past Surgical History:  Past Surgical History:  Procedure Laterality Date   HYSTEROTOMY     MASS EXCISION  10/08/2011   Procedure: MINOR EXCISION OF MASS;  Surgeon: Wyn Forster., MD;  Location: [facility];  Service: Orthopedics;  Laterality: Left;  excisional biopsy dorsum left long finger MCP joint   HPI:  IWONA FORESTIER is a 77 y.o. female with medical history significant of GERD, HTN, Anxiety, MDD, Insomnia and dementia brought in to the hospital today by her husband who reports increased confusion. MRI shows:Evidence of a punctate acute cortical infarct at the junction of  the posterior left temporal and occipital lobes (probably posterior  Left MCA territory). No associated hemorrhage or mass effect.    No other acute intracranial abnormality. Mild for age white  matter but moderate chronic pontine signal changes are nonspecific  but most commonly due to small vessel disease. SLE requested as part of stroke protocol.   Assessment / Plan / Recommendation Clinical Impression  Pt seen in room for cognitive linguistic evaluation. Chart review reveals a h/o memory deficits in suspected  setting of Alzheimer's disease (see neurology and neuropsych reports). Pt verbal, engaged, and in a pleasant mood. She was oriented to "June" (July 1) "2025" (2024) and was unable to state why she was in the hospital. She acknowledged that she was in the hospital, but did not know it was [facility]. She exhibited dysnomia, circumlocution, and general word finding deficits with vague responses. She was able to tell me that she lives with her husband (not present for evaluation), has 3  children, and 5 grandchildren. She named 4 animals in one minute, but repeated items many times and then started naming foods without awareness. Suspect her cognitive linguistic skills are near baseline after reading neuro notes, however he spouse is not present to confirm. She reportedly has the necessary supervision at home. Consider HH SLP therapy if she will be receiving PT to provide short term SLP therapy to provide education and strategies at home to facilitate increased independence with communication at home. SLP will sign off during acute stay.    SLP Assessment  SLP Recommendation/Assessment: All further Speech Lanaguage Pathology  needs can be addressed in the next venue of care SLP Visit Diagnosis: Cognitive communication deficit (R41.841)    Recommendations for follow up therapy are one component of a multi-disciplinary discharge planning process, led by the attending physician.  Recommendations may be updated based on patient status, additional functional criteria and insurance authorization.    Follow Up Recommendations  Home health SLP    Assistance Recommended at Discharge  Frequent or constant Supervision/Assistance  Functional Status Assessment Patient has had a recent decline in their functional status and/or demonstrates limited ability to make significant improvements in function in a reasonable and predictable amount of time  Frequency and Duration           SLP Evaluation Cognition  Overall Cognitive Status: History of cognitive impairments - at baseline Arousal/Alertness: Awake/alert Orientation Level: Oriented to person;Disoriented to place;Disoriented to time;Disoriented to situation Year: 2025 Month: June Day of Week: Incorrect Attention: Sustained Sustained Attention: Impaired Sustained Attention Impairment: Verbal basic;Functional basic Memory: Impaired Memory Impairment: Storage deficit;Retrieval deficit;Prospective memory Awareness: Impaired Awareness  Impairment: Intellectual impairment Problem Solving: Impaired Problem Solving Impairment: Verbal complex;Functional complex Executive Function: Self Monitoring;Organizing Organizing: Impaired Organizing Impairment: Verbal complex Self Monitoring: Impaired Self Monitoring Impairment: Verbal complex Safety/Judgment: Appears intact       Comprehension  Auditory Comprehension Overall Auditory Comprehension: Appears within functional limits for tasks assessed Yes/No Questions: Within Functional Limits Commands: Within Functional Limits Conversation: Simple Interfering Components: Attention;Working Radio broadcast assistant: Repetition Counsellor: Within [employer] Reading Comprehension Reading Status: Not tested    Expression Expression Primary Mode of Expression: Verbal Verbal Expression Overall Verbal Expression: Impaired Initiation: No impairment Automatic Speech: Name;Social Response;Day of week Level of Generative/Spontaneous Verbalization: Conversation Repetition: Impaired Level of Impairment: Sentence level Naming: Impairment Responsive: 76-100% accurate Confrontation: Within functional limits Convergent: 25-49% accurate Divergent: 25-49% accurate Pragmatics: No impairment Interfering Components: Attention Effective Techniques: Phonemic cues Non-Verbal Means of Communication: Not applicable Written Expression Dominant Hand: Left Written Expression: Not tested   Oral / Motor  Oral Motor/Sensory Function Overall Oral Motor/Sensory Function: Within functional limits Motor Speech Overall Motor Speech: Appears within functional limits for tasks assessed Respiration: Within functional limits Phonation: Normal Resonance: Within functional limits Articulation: Within functional limitis Intelligibility: Intelligible Motor Planning: Witnin functional limits Motor Speech Errors: Not applicable           Thank you,  Havery Moros, CCC-SLP [phone]  Eliel Dudding  12/27/2022, 4:07 PM

## 2022-12-27 NOTE — Progress Notes (Signed)
Patient discharged home with husband after Dr. Marisa Severin reviewed MRI. Paper work reviewed and given to husband. Patient dressed and wheeled patient down to Husbands care with Nurse Lenetta Quaker.

## 2022-12-27 NOTE — Progress Notes (Signed)
*  PRELIMINARY RESULTS* Echocardiogram 2D Echocardiogram has been performed.  Carolyne Fiscal 12/27/2022, 10:43 AM

## 2022-12-27 NOTE — Care Management Obs Status (Signed)
MEDICARE OBSERVATION STATUS NOTIFICATION   Patient Details  Name: Diana Smith MRN: [id number] Date of Birth: 04/13/1946   Medicare Observation Status Notification Given:  Yes    Corey Harold 12/27/2022, 4:16 PM

## 2022-12-27 NOTE — Evaluation (Signed)
Physical Therapy Evaluation Patient Details Name: Diana Smith MRN: [id number] DOB: [birth year] Today's Date: 12/27/2022  History of Present Illness  Jermesha L Freiwald is a 77 y.o. female with medical history significant of GERD, HTN, Anxiety, MDD, Insomnia and dementia brought in to the hospital today by her husband who reports increased confusion. Hospitalized for similar in 10/23 and had a UTI. Labs and w/u in ED were normal. Neurology called by EDP who asked for admission, reported they would see the patient in the am, and asked for MRI in the am.   Clinical Impression  Patient functioning near baseline for functional mobility and gait other than requiring verbal cues for letting arms swing during gait training with fair carryover, otherwise no loss of balance during ambulation or completing functional tasks.  PLAN:  Patient to be discharged home today and discharged from acute physical therapy to care of nursing for ambulation as tolerated for length of stay with recommendations stated below         Recommendations for follow up therapy are one component of a multi-disciplinary discharge planning process, led by the attending physician.  Recommendations may be updated based on patient status, additional functional criteria and insurance authorization.  Follow Up Recommendations       Assistance Recommended at Discharge Set up Supervision/Assistance  Patient can return home with the following  A little help with walking and/or transfers;A little help with bathing/dressing/bathroom;Help with stairs or ramp for entrance;Assistance with cooking/housework    Equipment Recommendations None recommended by PT  Recommendations for Other Services       Functional Status Assessment Patient has had a recent decline in their functional status and/or demonstrates limited ability to make significant improvements in function in a reasonable and predictable amount of time     Precautions /  Restrictions Precautions Precautions: Fall Restrictions Weight Bearing Restrictions: No      Mobility  Bed Mobility Overal bed mobility: Needs Assistance Bed Mobility: Supine to Sit     Supine to sit: Min assist     General bed mobility comments: Husband gave min A to pull pt to sitting.    Transfers Overall transfer level: Needs assistance   Transfers: Sit to/from Stand, Bed to chair/wheelchair/BSC Sit to Stand: Modified independent (Device/Increase time), Supervision   Step pivot transfers: Modified independent (Device/Increase time), Supervision       General transfer comment: slightly labored movement    Ambulation/Gait Ambulation/Gait assistance: Modified independent (Device/Increase time), Supervision Gait Distance (Feet): 100 Feet Assistive device: None Gait Pattern/deviations: Decreased step length - left, Decreased stance time - right, Decreased stride length Gait velocity: slightly decreased     General Gait Details: slightly labored movement with decreased bilateral armswing, no loss of balance with good return for ambulating in room and hallways  Stairs            Wheelchair Mobility    Modified Rankin (Stroke Patients Only)       Balance Overall balance assessment: Mild deficits observed, not formally tested                                           Pertinent Vitals/Pain Pain Assessment Pain Assessment: No/denies pain    Home Living Family/patient expects to be discharged to:: Private residence Living Arrangements: Spouse/significant other Available Help at Discharge: Family;Available 24 hours/day Type of Home: House Home Access: Stairs to enter  Entrance Stairs-Rails: None Entrance Stairs-Number of Steps: 2   Home Layout: Two level;Able to live on main level with bedroom/bathroom Home Equipment: None      Prior Function Prior Level of Function : Independent/Modified Independent;Needs assist       Physical  Assist : Mobility (physical);ADLs (physical) Mobility (physical): Bed mobility;Transfers;Gait;Stairs   Mobility Comments: Community ambulator without AD ADLs Comments: Independent ADL; can do cooking and cleaning.     Hand Dominance   Dominant Hand: Left    Extremity/Trunk Assessment   Upper Extremity Assessment Upper Extremity Assessment: Defer to OT evaluation    Lower Extremity Assessment Lower Extremity Assessment: Overall WFL for tasks assessed    Cervical / Trunk Assessment Cervical / Trunk Assessment: Normal  Communication   Communication: No difficulties  Cognition Arousal/Alertness: Awake/alert Behavior During Therapy: WFL for tasks assessed/performed Overall Cognitive Status: History of cognitive impairments - at baseline                                          General Comments      Exercises     Assessment/Plan    PT Assessment Patient does not need any further PT services  PT Problem List         PT Treatment Interventions      PT Goals (Current goals can be found in the Care Plan section)  Acute Rehab PT Goals Patient Stated Goal: return home with family to assist PT Goal Formulation: With patient/family Time For Goal Achievement: 01/24/23 Potential to Achieve Goals: Good    Frequency       Co-evaluation PT/OT/SLP Co-Evaluation/Treatment: Yes Reason for Co-Treatment: To address functional/ADL transfers PT goals addressed during session: Mobility/safety with mobility;Balance         AM-PAC PT "6 Clicks" Mobility  Outcome Measure Help needed turning from your back to your side while in a flat bed without using bedrails?: None Help needed moving from lying on your back to sitting on the side of a flat bed without using bedrails?: A Little Help needed moving to and from a bed to a chair (including a wheelchair)?: None Help needed standing up from a chair using your arms (e.g., wheelchair or bedside chair)?: None Help  needed to walk in hospital room?: A Little Help needed climbing 3-5 steps with a railing? : A Little 6 Click Score: 21    End of Session   Activity Tolerance: Patient tolerated treatment well Patient left: in chair;with call bell/phone within reach;with family/visitor present Nurse Communication: Mobility status PT Visit Diagnosis: Unsteadiness on feet (R26.81);Other abnormalities of gait and mobility (R26.89);Muscle weakness (generalized) (M62.81)    Time: 9604-5409 PT Time Calculation (min) (ACUTE ONLY): 23 min   Charges:   PT Evaluation $PT Eval Moderate Complexity: 1 Mod PT Treatments $Therapeutic Activity: 23-37 mins        12:06 PM, 12/27/22 Ocie Bob, MPT Physical Therapist with [facility] [address] [phone] office [phone] mobile phone

## 2022-12-27 NOTE — Progress Notes (Signed)
Patient alert and oriented to name and place. Patient able to follow commands for stroke eval. No deficits noted except patient scoring 2, unable to answer LOC questions due to confusion. Patient has slept on and off this shift. No complaints of pain. Husband at bedside.

## 2022-12-27 NOTE — Discharge Summary (Signed)
Diana Smith, is a 77 y.o. female  DOB Jan 28, 1946  MRN [id number].  Admission date:  12/26/2022  Admitting Physician  Reva Bores, MD  Discharge Date:  12/27/2022   Primary MD  Mila Palmer, MD  Recommendations for primary care physician for things to follow:   1)Please take Aspirin 81 mg daily along with Plavix 75 mg daily for 21 days then after that STOP the Plavix  and continue ONLY Aspirin 81 mg daily indefinitely--for secondary stroke Prevention   2)Increase Crestor (Rosuvastatin) to 20 mg daily for stroke prevention--  3)Follow up with Neurologist Dr. Pearlean Brownie in about 4 to 6 weeks  4)They will mail a 30-day event/heart monitor to your home address- to help look for irregular heartbeat (Atrial Fibrillation/Atrial Flutter-- please follow the instructions on the box and return it as advised  Admission Diagnosis  TIA (transient ischemic attack) [G45.9]   Discharge Diagnosis  TIA (transient ischemic attack) [G45.9]    Principal Problem:   TIA (transient ischemic attack) Active Problems:   Cerebrovascular disease   Gastro-esophageal reflux disease without esophagitis   Generalized anxiety disorder   Hyperlipidemia   Major depressive disorder   Vitamin B12 deficiency (non anemic)   Essential hypertension   Insomnia   Dementia due to Alzheimer's disease      Past Medical History:  Diagnosis Date   Abdominal pain 04/24/2022   Actinic keratosis    Allergic rhinitis due to pollen    Cerebrovascular disease    Dementia due to Alzheimer's disease 09/10/2022   Essential hypertension 04/24/2022   Frequent urinary tract infections    Gastro-esophageal reflux disease without esophagitis    Generalized anxiety disorder    Heartburn    Hematochezia    History of COVID-19    Hyperglycemia    Hyperlipidemia    Hypoalbuminemia due to protein-calorie malnutrition 04/24/2022   Insomnia  04/24/2022   Major depressive disorder    Nonalcoholic steatohepatitis (NASH)    Osteoarthritis of left knee 03/21/2022   Osteoarthritis of right knee 01/25/2021   Overactive bladder    Personal history of colonic polyps    Prediabetes    Pulmonary nodule 04/24/2022   Pure hypercholesterolemia    Right bundle branch block    Rosacea    Sciatica, right side    Slow transit constipation    Starvation ketoacidosis 04/24/2022   UTI (urinary tract infection) 04/24/2022   Vitamin B12 deficiency (non anemic)    Vitamin D deficiency     Past Surgical History:  Procedure Laterality Date   HYSTEROTOMY     MASS EXCISION  10/08/2011   Procedure: MINOR EXCISION OF MASS;  Surgeon: Wyn Forster., MD;  Location: [facility];  Service: Orthopedics;  Laterality: Left;  excisional biopsy dorsum left long finger MCP joint     HPI  from the history and physical done on the day of admission:     HPI: Diana Smith is a 77 y.o. female with medical history significant of GERD, HTN,  Anxiety, MDD, Insomnia and dementia brought in to the hospital today by her husband who reports increased confusion. Hospitalized for similar in 10/23 and had a UTI. Labs and w/u in ED were normal. Neurology called by EDP who asked for admission, reported they would see the patient in the am, and asked for MRI in the am.   Review of Systems: unable to review all systems due to the inability of the patient to answer questions.   Hospital Course:     Diana Smith is a 77 y.o. female with medical history significant of GERD, HTN, Anxiety, MDD, Insomnia and dementia brought in to the hospital today by her husband who reports increased confusion. Hospitalized for similar in 10/23 and had a UTI. Labs and w/u in ED were normal. Neurology called by EDP who asked for admission, reported they would see the patient in the am, and asked for MRI in the am.   Assessment and Plan: 1)Acute stroke---MRI Brain  with punctate acute cortical infarct at the junction of the posterior left temporal and occipital lobes (probably posterior Left MCA territory). No associated hemorrhage or mass effect. -Echo with EF of 60 to 65% and grade 1 diastolic dysfunction,, no mitral stenosis and no aortic stenosis, no regional wall motion abnormalities -LDL is 98 with HDL of 47---PTA patient was already taking Crestor 3 times a week -Even if her lipid panel is within desired limits, patient should still take Lipitor/Statin for it's Pleiotropic effects (beyond cholesterol lowering benefits) -A1c pending On Telemetry no significant arrhythmias -MRA head and neck pending to rule out LVO and critical stenosis -Neurology consult appreciated -take Aspirin 81 mg daily along with Plavix 75 mg daily for 21 days then after that STOP the Plavix  and continue ONLY Aspirin 81 mg daily indefinitely--for secondary stroke Prevention (Per The multicenter SAMMPRIS trial) -Outpatient follow-up with allergist Dr. Pearlean Brownie advised  2)Dementia --with progressive memory and cognitive deficits Continue Namenda -As needed Xanax -Continue Lexapro, stop paroxetine -UA without infection -B12 is not low  3)Insomnia Continue Melatonin  4)HTN- Continue ARB  Vitamin B12 deficiency (non anemic) Continue oral repletion Serum B12 is not low  Gastro-esophageal reflux disease without esophagitis Continue PPI  Discharge Condition: stable, -----Plan of care discussed with patient husband and patient's granddaughter [name] at bedside  Follow UP   Follow-up Information     Mila Palmer, MD Follow up in 1 week(s).   Specialty: Family Medicine Contact information: [address] [place] [address] [phone]         Micki Riley, MD. Schedule an appointment as soon as possible for a visit in 1 month(s).   Specialties: Neurology, Radiology Contact information: [address] [place]  [address] [phone]                  Consults obtained - Neuro  Diet and Activity recommendation:  As advised  Discharge Instructions   Discharge Instructions     Call MD for:  difficulty breathing, headache or visual disturbances   Complete by: As directed    Call MD for:  persistant dizziness or light-headedness   Complete by: As directed    Call MD for:  persistant nausea and vomiting   Complete by: As directed    Call MD for:  temperature >100.4   Complete by: As directed    Diet - low sodium heart healthy   Complete by: As directed    Discharge instructions   Complete  by: As directed    1)Please take Aspirin 81 mg daily along with Plavix 75 mg daily for 21 days then after that STOP the Plavix  and continue ONLY Aspirin 81 mg daily indefinitely--for secondary stroke Prevention   2)Increase Crestor (Rosuvastatin) to 20 mg daily for stroke prevention--  3)Follow up with Neurologist Dr. Pearlean Brownie in about 4 to 6 weeks  4)They will mail a 30-day event/heart monitor to your home address- to help look for irregular heartbeat (Atrial Fibrillation/Atrial Flutter-- please follow the instructions on the box and return it as advised   Increase activity slowly   Complete by: As directed          Discharge Medications     Allergies as of 12/27/2022       Reactions   Dynacin [minocycline Hcl]    Gatifloxacin Other (See Comments)   Lorabid [loracarbef]    Minocycline Other (See Comments)        Medication List     STOP taking these medications    ondansetron 8 MG disintegrating tablet Commonly known as: ZOFRAN-ODT   PARoxetine 30 MG tablet Commonly known as: PAXIL       TAKE these medications    ALPRAZolam 0.25 MG tablet Commonly known as: XANAX Take 0.125-0.25 mg by mouth See admin instructions. Take 1/2 tablet by mouth daily if needed take 1 tablet as needed for anxiety   aspirin EC 81 MG tablet Take 1 tablet (81 mg total) by mouth daily. Swallow  whole. Start taking on: December 28, 2022   Biotin 1000 MCG tablet Take 1,000 mcg by mouth daily.   calcium carbonate 1500 (600 Ca) MG Tabs tablet Commonly known as: OSCAL Take 1,500 mg by mouth daily with breakfast.   clopidogrel 75 MG tablet Commonly known as: Plavix Take 1 tablet (75 mg total) by mouth daily for 30 doses.   escitalopram 5 MG tablet Commonly known as: LEXAPRO Take 5 mg by mouth daily.   Melatonin 10 MG Caps Take 10 mg by mouth at bedtime.   memantine 10 MG tablet Commonly known as: NAMENDA   omeprazole 20 MG capsule Commonly known as: PRILOSEC Take 2 capsules (40 mg total) by mouth daily.   PROBIOTIC DAILY PO Take 1 Capful by mouth daily.   rosuvastatin 20 MG tablet Commonly known as: Crestor Take 1 tablet (20 mg total) by mouth daily. What changed:  medication strength how much to take when to take this   thiamine 100 MG tablet Commonly known as: Vitamin B-1 Take 1 tablet (100 mg total) by mouth daily.   valsartan 40 MG tablet Commonly known as: DIOVAN Take 1 tablet (40 mg total) by mouth daily.   vitamin B-12 500 MCG tablet Commonly known as: CYANOCOBALAMIN Take 500 mcg by mouth daily.        Major procedures and Radiology Reports - PLEASE review detailed and final reports for all details, in brief -   ECHOCARDIOGRAM COMPLETE  Result Date: 12/27/2022    ECHOCARDIOGRAM REPORT   Patient Name:   Diana Smith Date of Exam: 12/27/2022 Medical Rec #:  [id number]        Height:       65.0 in Accession #:    [id number]       Weight:       168.0 lb Date of Birth:  01-29-1946        BSA:          1.837 m Patient Age:    31  years         BP:           119/62 mmHg Patient Gender: F                HR:           82 bpm. [facility] Procedure: 2D Echo, Cardiac Doppler and Color Doppler Indications:    TIA  History:        Patient has prior history of Echocardiogram examinations, most                 recent 04/14/2022. TIA, Arrythmias:RBBB,                  Signs/Symptoms:Alzheimer's; Risk Factors:Hypertension and                 Dyslipidemia.  Sonographer:    Mikki Harbor Referring Phys: [id number] S PRATT IMPRESSIONS  1. Left ventricular ejection fraction, by estimation, is 60 to 65%. The left ventricle has normal function. The left ventricle has no regional wall motion abnormalities. Left ventricular diastolic parameters are consistent with Grade I diastolic dysfunction (impaired relaxation).  2. Right ventricular systolic function is normal. The right ventricular size is normal.  3. The mitral valve is normal in structure. No evidence of mitral valve regurgitation. No evidence of mitral stenosis.  4. The aortic valve is tricuspid. Aortic valve regurgitation is mild. No aortic stenosis is present.  5. The pulmonic valve was abnormal.  6. The inferior vena cava is normal in size with greater than 50% respiratory variability, suggesting right atrial pressure of 3 mmHg. FINDINGS  Left Ventricle: Left ventricular ejection fraction, by estimation, is 60 to 65%. The left ventricle has normal function. The left ventricle has no regional wall motion abnormalities. The left ventricular internal cavity size was normal in size. There is  no left ventricular hypertrophy. Left ventricular diastolic parameters are consistent with Grade I diastolic dysfunction (impaired relaxation). Normal left ventricular filling pressure. Right Ventricle: The right ventricular size is normal. Right vetricular wall thickness was not well visualized. Right ventricular systolic function is normal. Left Atrium: Left atrial size was normal in size. Right Atrium: Right atrial size was normal in size. Pericardium: There is no evidence of pericardial effusion. Mitral Valve: The mitral valve is normal in structure. No evidence of mitral valve regurgitation. No evidence of mitral valve stenosis. MV peak gradient, 3.9 mmHg. The mean mitral valve gradient is 1.0 mmHg. Tricuspid Valve: The  tricuspid valve is normal in structure. Tricuspid valve regurgitation is not demonstrated. No evidence of tricuspid stenosis. Aortic Valve: The aortic valve is tricuspid. Aortic valve regurgitation is mild. No aortic stenosis is present. Aortic valve mean gradient measures 3.0 mmHg. Aortic valve peak gradient measures 6.6 mmHg. Aortic valve area, by VTI measures 2.44 cm. Pulmonic Valve: The pulmonic valve was abnormal. Pulmonic valve regurgitation is mild. No evidence of pulmonic stenosis. Aorta: The aortic root is normal in size and structure. Venous: The inferior vena cava is normal in size with greater than 50% respiratory variability, suggesting right atrial pressure of 3 mmHg. IAS/Shunts: No atrial level shunt detected by color flow Doppler.  LEFT VENTRICLE PLAX 2D LVIDd:         5.50 cm   Diastology LVIDs:         2.80 cm   LV e' medial:    6.31 cm/s LV PW:         1.00 cm  LV E/e' medial:  5.8 LV IVS:        1.00 cm   LV e' lateral:   6.53 cm/s LVOT diam:     2.00 cm   LV E/e' lateral: 5.6 LV SV:         64 LV SV Index:   35 LVOT Area:     3.14 cm  RIGHT VENTRICLE RV Basal diam:  3.20 cm RV Mid diam:    2.90 cm RV S prime:     11.60 cm/s TAPSE (M-mode): 2.3 cm LEFT ATRIUM             Index        RIGHT ATRIUM           Index LA diam:        3.90 cm 2.12 cm/m   RA Area:     14.80 cm LA Vol (A2C):   37.6 ml 20.47 ml/m  RA Volume:   39.30 ml  21.40 ml/m LA Vol (A4C):   42.8 ml 23.30 ml/m LA Biplane Vol: 40.0 ml 21.78 ml/m  AORTIC VALVE                    PULMONIC VALVE AV Area (Vmax):    2.50 cm     PV Vmax:       1.13 m/s AV Area (Vmean):   2.52 cm     PV Peak grad:  5.1 mmHg AV Area (VTI):     2.44 cm AV Vmax:           128.00 cm/s AV Vmean:          86.100 cm/s AV VTI:            0.263 m AV Peak Grad:      6.6 mmHg AV Mean Grad:      3.0 mmHg LVOT Vmax:         102.00 cm/s LVOT Vmean:        69.200 cm/s LVOT VTI:          0.204 m LVOT/AV VTI ratio: 0.78  AORTA Ao Root diam: 3.50 cm Ao Asc diam:   3.30 cm MITRAL VALVE MV Area (PHT): 4.15 cm    SHUNTS MV Area VTI:   4.19 cm    Systemic VTI:  0.20 m MV Peak grad:  3.9 mmHg    Systemic Diam: 2.00 cm MV Mean grad:  1.0 mmHg MV Vmax:       0.99 m/s MV Vmean:      42.3 cm/s MV Decel Time: 183 msec MV E velocity: 36.80 cm/s MV A velocity: 84.40 cm/s MV E/A ratio:  0.44 Diana Rich MD Electronically signed by Diana Rich MD Signature Date/Time: 12/27/2022/11:04:37 AM    Final    MR BRAIN W WO CONTRAST  Result Date: 12/27/2022 CLINICAL DATA:  77 year old female TIA.  Delirium, dizziness. EXAM: MRI HEAD WITHOUT AND WITH CONTRAST TECHNIQUE: Multiplanar, multiecho pulse sequences of the brain and surrounding structures were obtained without and with intravenous contrast. CONTRAST:  7.22mL GADAVIST GADOBUTROL 1 MMOL/ML IV SOLN COMPARISON:  Head CT yesterday.  Brain MRI 01/18/2021. FINDINGS: Brain: Punctate cortical focus of restricted diffusion at the junction of the posterior left temporal and lateral occipital lobe series 5, image 14. This is also visible on coronal DWI (series 8, image 9). Mild if any T2/FLAIR hyperintensity. No other convincing diffusion restriction, acute infarct. Cerebral volume has not significantly changed since 2022. No midline shift, mass effect,  evidence of mass lesion, ventriculomegaly, extra-axial collection or acute intracranial hemorrhage. Cervicomedullary junction and pituitary are within normal limits. Mild for age cerebral white matter (mostly periventricular), but more moderate chronic pontine T2 and FLAIR hyperintensity redemonstrated. No cortical encephalomalacia or chronic cerebral blood products (T2*) identified. No abnormal enhancement identified.  No dural thickening. Vascular: Major intracranial vascular flow voids are stable. Dominant left vertebral artery again noted. Following contrast the major dural venous structures are enhancing and appear to be patent. Skull and upper cervical spine: Negative for age visible  cervical spine. Visible bone marrow signal is stable and within normal limits. Sinuses/Orbits: Negative. Other: Trace mastoid air cell fluid is stable from 2022 and appears inconsequential. Negative visible nasopharynx. Visible internal auditory structures appear normal. Normal stylomastoid foramina. Negative visible scalp and face. IMPRESSION: 1. Evidence of a punctate acute cortical infarct at the junction of the posterior left temporal and occipital lobes (probably posterior Left MCA territory). No associated hemorrhage or mass effect. 2. No other acute intracranial abnormality. Mild for age white matter but moderate chronic pontine signal changes are nonspecific but most commonly due to small vessel disease. Electronically Signed   By: Odessa Fleming M.D.   On: 12/27/2022 10:24   DG Chest Port 1 View  Result Date: 12/26/2022 CLINICAL DATA:  Shortness of breath, dizziness EXAM: PORTABLE CHEST 1 VIEW COMPARISON:  None Available. FINDINGS: Transverse diameter of heart is increased. There are no signs of pulmonary edema or focal pulmonary consolidation. There is no pleural effusion or pneumothorax. IMPRESSION: Cardiomegaly. There are no signs of pulmonary edema or focal pulmonary consolidation. Electronically Signed   By: Ernie Avena M.D.   On: 12/26/2022 13:19   CT Head Wo Contrast  Result Date: 12/26/2022 CLINICAL DATA:  Delirium with dizziness. EXAM: CT HEAD WITHOUT CONTRAST TECHNIQUE: Contiguous axial images were obtained from the base of the skull through the vertex without intravenous contrast. RADIATION DOSE REDUCTION: This exam was performed according to the departmental dose-optimization program which includes automated exposure control, adjustment of the mA and/or kV according to patient size and/or use of iterative reconstruction technique. COMPARISON:  MRI brain 01/18/2021 FINDINGS: Brain: Ventricles, cisterns and other CSF spaces are normal. There is mild chronic ischemic microvascular disease.  No mass, mass effect, shift of midline structures or acute hemorrhage. No evidence of acute infarction. Vascular: No hyperdense vessel or unexpected calcification. Skull: Normal. Negative for fracture or focal lesion. Sinuses/Orbits: No acute finding. Other: None. IMPRESSION: 1. No acute findings. 2. Mild chronic ischemic microvascular disease. Electronically Signed   By: Elberta Fortis M.D.   On: 12/26/2022 13:07    Today   Subjective    Diana Creeden today has no new complaints  No fever  Or chills   No Nausea, Vomiting or Diarrhea -No new acute motor or sensory deficits at this time -Dementia symptoms persist -Plan of care discussed with patient husband and patient's granddaughter [name] at bedside         Patient has been seen and examined prior to discharge   Objective   Blood pressure 133/68, pulse 66, temperature 98.6 F (37 C), temperature source Oral, resp. rate 17, height 5\' 5"  (1.651 m), weight 76.2 kg, SpO2 97 %.   Intake/Output Summary (Last 24 hours) at 12/27/2022 1918 Last data filed at 12/27/2022 0900 Gross per 24 hour  Intake 240 ml  Output --  Net 240 ml    Exam Gen:- Awake Alert, no acute distress  HEENT:- [place].AT, No sclera icterus Neck-Supple Neck,No  JVD,.  Lungs-  CTAB , good air movement bilaterally CV- S1, S2 normal, regular Abd-  +ve B.Sounds, Abd Soft, No tenderness,    Extremity/Skin:- No  edema,   good pulses Psych-affect is appropriate, oriented x2, obvious memory and cognitive deficits Neuro-no new focal deficits, no tremors    Data Review   CBC w Diff:  Lab Results  Component Value Date   WBC 7.9 12/26/2022   HGB 12.7 12/26/2022   HCT 39.0 12/26/2022   PLT 245 12/26/2022   LYMPHOPCT 26 12/26/2022   MONOPCT 8 12/26/2022   EOSPCT 1 12/26/2022   BASOPCT 0 12/26/2022    CMP:  Lab Results  Component Value Date   NA 137 12/26/2022   K 4.0 12/26/2022   CL 107 12/26/2022   CO2 25 12/26/2022   BUN 13 12/26/2022   CREATININE 0.71  12/26/2022   PROT 6.9 12/26/2022   ALBUMIN 3.6 12/26/2022   BILITOT 0.7 12/26/2022   ALKPHOS 105 12/26/2022   AST 21 12/26/2022   ALT 18 12/26/2022  .  Total Discharge time is about 33 minutes  Diana Smith M.D on 12/27/2022 at 7:18 PM  Go to [email or web address] -  for contact info  [facility] - Office  [phone]

## 2022-12-27 NOTE — Consult Note (Addendum)
I connected with  Diana Smith on 12/27/22 by a video enabled telemedicine application and verified that I am speaking with the correct person using two identifiers.   I discussed the limitations of evaluation and management by telemedicine. The patient expressed understanding and agreed to proceed.  Location of patient: [facility] Location of physician: [facility]  Neurology Consultation Reason for Consult: stroke Referring Physician: Dr Shon Hale  CC: ams  History is obtained from: patient, husband, chart review  HPI: Diana Smith is a 77 y.o. female with past medical history of anxiety, depression, OCD, dementia and goes MS disease who was brought in on 12/26/2022 for dizziness and "not feeling good".  Per husband, she had similar symptoms in the past due to UTI.  Patient was admitted for further workup.  UA did show moderate leukocytes but was not suggestive of UTI.  Per husband, her symptoms resolved within 2 hours or so.  Of note, at baseline patient does have dementia but per husband has never been confused like this in the morning before.  MRI brain was obtained which showed acute punctate infarct in the left temporal/occipital region.  Neurology was consulted for further recommendations.  Last known normal: 12/25/2018 4 at night No tPA as outside window No thrombectomy as no LVO Event happened at home mRS 3  ROS: All other systems reviewed and negative except as noted in the HPI.  Past Medical History:  Diagnosis Date   Abdominal pain 04/24/2022   Actinic keratosis    Allergic rhinitis due to pollen    Cerebrovascular disease    Dementia due to Alzheimer's disease 09/10/2022   Essential hypertension 04/24/2022   Frequent urinary tract infections    Gastro-esophageal reflux disease without esophagitis    Generalized anxiety disorder    Heartburn    Hematochezia    History of COVID-19    Hyperglycemia    Hyperlipidemia    Hypoalbuminemia  due to protein-calorie malnutrition 04/24/2022   Insomnia 04/24/2022   Major depressive disorder    Nonalcoholic steatohepatitis (NASH)    Osteoarthritis of left knee 03/21/2022   Osteoarthritis of right knee 01/25/2021   Overactive bladder    Personal history of colonic polyps    Prediabetes    Pulmonary nodule 04/24/2022   Pure hypercholesterolemia    Right bundle branch block    Rosacea    Sciatica, right side    Slow transit constipation    Starvation ketoacidosis 04/24/2022   UTI (urinary tract infection) 04/24/2022   Vitamin B12 deficiency (non anemic)    Vitamin D deficiency     Family History  Problem Relation Age of Onset   Dementia Father    Memory loss Father    Breast cancer Neg Hx     Social History:  reports that she has never smoked. She has never used smokeless tobacco. She reports that she does not drink alcohol and does not use drugs.   Medications Prior to Admission  Medication Sig Dispense Refill Last Dose   ALPRAZolam (XANAX) 0.25 MG tablet Take 0.125-0.25 mg by mouth See admin instructions. Take 1/2 tablet by mouth daily if needed take 1 tablet as needed for anxiety      Biotin 1000 MCG tablet Take 1,000 mcg by mouth daily.      calcium carbonate (OSCAL) 1500 (600 Ca) MG TABS tablet Take 1,500 mg by mouth daily with breakfast.      escitalopram (LEXAPRO) 5 MG tablet Take 5 mg by  mouth daily.      Melatonin 10 MG CAPS Take 10 mg by mouth at bedtime.      memantine (NAMENDA) 10 MG tablet       omeprazole (PRILOSEC) 20 MG capsule Take 2 capsules (40 mg total) by mouth daily. 60 capsule 2    ondansetron (ZOFRAN-ODT) 8 MG disintegrating tablet Take 1 tablet (8 mg total) by mouth every 8 (eight) hours as needed for nausea or vomiting. (Patient not taking: Reported on 09/21/2022) 20 tablet 0    PARoxetine (PAXIL) 30 MG tablet Take 40 mg by mouth every morning.      Probiotic Product (PROBIOTIC DAILY PO) Take 1 Capful by mouth daily.      rosuvastatin (CRESTOR)  5 MG tablet Take 5 mg by mouth 3 (three) times a week.      thiamine (VITAMIN B-1) 100 MG tablet Take 1 tablet (100 mg total) by mouth daily. 30 tablet 1    valsartan (DIOVAN) 40 MG tablet Take 1 tablet (40 mg total) by mouth daily. 30 tablet 1    vitamin B-12 (CYANOCOBALAMIN) 500 MCG tablet Take 500 mcg by mouth daily.         Exam: Current vital signs: BP 133/68 (BP Location: Left Arm)   Pulse 66   Temp 98.6 F (37 C) (Oral)   Resp 17   Ht 5\' 5"  (1.651 m)   Wt 76.2 kg   SpO2 97%   BMI 27.96 kg/m  Vital signs in last 24 hours: Temp:  [98.2 F (36.8 C)-99.7 F (37.6 C)] 98.6 F (37 C) (07/01 1343) Pulse Rate:  [55-86] 66 (07/01 1343) Resp:  [16-19] 17 (07/01 1343) BP: (114-160)/(59-71) 133/68 (07/01 1343) SpO2:  [93 %-98 %] 97 % (07/01 1343)   Physical Exam  Constitutional: Appears well-developed and well-nourished.  Psych: Affect appropriate to situation Neuro: Awake, alert, oriented to place and person but not to time (baseline), no aphasia, cranial nerves II to XII appear grossly intact, antigravity strength in all 4 extremities without drift, FTN intact bilaterally, sensation intact to light touch  NIHSS 0  I have reviewed labs in epic and the results pertinent to this consultation are: CBC:  Recent Labs  Lab 12/26/22 1210  WBC 7.9  NEUTROABS 5.2  HGB 12.7  HCT 39.0  MCV 97.5  PLT 245    Basic Metabolic Panel:  Lab Results  Component Value Date   NA 137 12/26/2022   K 4.0 12/26/2022   CO2 25 12/26/2022   GLUCOSE 117 (H) 12/26/2022   BUN 13 12/26/2022   CREATININE 0.71 12/26/2022   CALCIUM 8.8 (L) 12/26/2022   GFRNONAA >60 12/26/2022   Lipid Panel:  Lab Results  Component Value Date   LDLCALC 98 12/27/2022   HgbA1c: No results found for: "HGBA1C" Urine Drug Screen: No results found for: "LABOPIA", "COCAINSCRNUR", "LABBENZ", "AMPHETMU", "THCU", "LABBARB"  Alcohol Level No results found for: "ETH"   I have reviewed the images obtained:  CT  Head without contrast 12/26/2022: No acute findings. Mild chronic ischemic microvascular disease.  MRI Brain with and without contrast 12/27/2022:  Evidence of a punctate acute cortical infarct at the junction of the posterior left temporal and occipital lobes (probably posterior Left MCA territory). No associated hemorrhage or mass effect. No other acute intracranial abnormality. Mild for age white matter but moderate chronic pontine signal changes are nonspecific but most commonly due to small vessel disease.     ASSESSMENT/PLAN: 77 year old female with dementia brought in due to  confusion on waking up which resolved in about 2 hours.   Acute ischemic stroke (incidental) - Patient's symptoms were most likely not related to the stroke. -Etiology: Likely embolic versus small vessel disease  Recommendations: -Will obtain carotid ultrasound as well as MRA head to look for intracranial stenosis -Recommend aspirin 81 mg daily and Plavix 75 mg daily for 3 weeks followed by aspirin 81 mg daily. -If there is symptomatic intracranial stenosis, would need Plavix 75 mg daily for 3 months instead of 3 weeks -Recommend increasing rosuvastatin to 20 mg daily -Recommend 30-day event monitor to look for paroxysmal A-fib -PT/OT -Follow-up with [facility] in 2 to 3 months  Acute encephalopathy -Patient was transiently confused upon waking up without any other focal neurological deficits.  Her symptoms resolved within few hours.  Unclear if this is progression of patient's baseline dementia.  Infectious, toxic, metabolic workup so far is negative.  -Discussed plan with Dr. Mariea Clonts via secure chat and husband at bedside  Thank you for allowing [place] to participate in the care of this patient. If you have any further questions, please contact  me or neurohospitalist.   Lindie Spruce [facility]

## 2022-12-27 NOTE — Evaluation (Signed)
Occupational Therapy Evaluation Patient Details Name: Diana Smith MRN: [id number] DOB: 10/19/1945 Today's Date: 12/27/2022   History of Present Illness Diana Smith is a 77 y.o. female with medical history significant of GERD, HTN, Anxiety, MDD, Insomnia and dementia brought in to the hospital today by her husband who reports increased confusion. Hospitalized for similar in 10/23 and had a UTI. Labs and w/u in ED were normal. Neurology called by EDP who asked for admission, reported they would see the patient in the am, and asked for MRI in the am. (per MD)   Clinical Impression   Pt agreeable to OT and PT co-evaluation. Pt appears to be at or near baseline level of function for mobility and ADL's. Pt is generally weak but able to transfer and complete ADL's with supervision assist to mod I assist. Pt lives with husband who is present 24/7. Pt is not recommended for further acute OT services and will be discharged to care of nursing staff for remaining length of stay.       Recommendations for follow up therapy are one component of a multi-disciplinary discharge planning process, led by the attending physician.  Recommendations may be updated based on patient status, additional functional criteria and insurance authorization.   Assistance Recommended at Discharge Set up Supervision/Assistance        Functional Status Assessment  Patient has not had a recent decline in their functional status  Equipment Recommendations  None recommended by OT           Precautions / Restrictions Precautions Precautions: Fall Restrictions Weight Bearing Restrictions: No      Mobility Bed Mobility Overal bed mobility: Needs Assistance Bed Mobility: Supine to Sit     Supine to sit: Min assist     General bed mobility comments: Husband gave min A to pull pt to sitting.    Transfers Overall transfer level: Needs assistance   Transfers: Sit to/from Stand, Bed to  chair/wheelchair/BSC Sit to Stand: Modified independent (Device/Increase time), Supervision     Step pivot transfers: Modified independent (Device/Increase time), Supervision     General transfer comment: Mildly slow movement.      Balance Overall balance assessment: Mild deficits observed, not formally tested                                         ADL either performed or assessed with clinical judgement   ADL Overall ADL's : Modified independent                                       General ADL Comments: Able to doff and don sock seated in recliner.     Vision Baseline Vision/History: 1 Wears glasses Ability to See in Adequate Light: 1 Impaired Patient Visual Report: No change from baseline Vision Assessment?: No apparent visual deficits                Pertinent Vitals/Pain Pain Assessment Pain Assessment: No/denies pain     Hand Dominance Left   Extremity/Trunk Assessment Upper Extremity Assessment Upper Extremity Assessment: Generalized weakness   Lower Extremity Assessment Lower Extremity Assessment: Defer to PT evaluation   Cervical / Trunk Assessment Cervical / Trunk Assessment: Normal   Communication Communication Communication: No difficulties   Cognition Arousal/Alertness: Awake/alert Behavior During Therapy: [facility]  for tasks assessed/performed Overall Cognitive Status: History of cognitive impairments - at baseline                                                        Home Living Family/patient expects to be discharged to:: Private residence Living Arrangements: Spouse/significant other Available Help at Discharge: Family;Available 24 hours/day Type of Home: House Home Access: Stairs to enter [employer] of Steps: 2 Entrance Stairs-Rails: None Home Layout: Two level;Able to live on main level with bedroom/bathroom     Bathroom Shower/Tub: Higher education careers adviser: Handicapped height Bathroom Accessibility: Yes How Accessible: Accessible via wheelchair;Accessible via walker Home Equipment: None (may have access to a w/c)          Prior Functioning/Environment Prior Level of Function : Independent/Modified Independent;Needs assist             Mobility Comments: Community ambulator without AD ADLs Comments: Independent ADL; can do cooking and cleaning.                                Co-evaluation PT/OT/SLP Co-Evaluation/Treatment: Yes Reason for Co-Treatment: To address functional/ADL transfers   OT goals addressed during session: ADL's and self-care      AM-PAC OT "6 Clicks" Daily Activity     Outcome Measure Help from another person eating meals?: None Help from another person taking care of personal grooming?: None Help from another person toileting, which includes using toliet, bedpan, or urinal?: None Help from another person bathing (including washing, rinsing, drying)?: None Help from another person to put on and taking off regular upper body clothing?: None Help from another person to put on and taking off regular lower body clothing?: None 6 Click Score: 24   End of Session Equipment Utilized During Treatment: Gait belt  Activity Tolerance: Patient tolerated treatment well Patient left: in chair;with call bell/phone within reach;with family/visitor present  OT Visit Diagnosis: Unsteadiness on feet (R26.81);Other abnormalities of gait and mobility (R26.89)                Time: 0981-1914 OT Time Calculation (min): 18 min Charges:  OT General Charges $OT Visit: 1 Visit OT Evaluation $OT Eval Low Complexity: 1 Low  Lorrin Nawrot OT, MOT  Danie Chandler 12/27/2022, 8:35 AM

## 2022-12-28 ENCOUNTER — Telehealth: Payer: Self-pay | Admitting: Physician Assistant

## 2022-12-28 ENCOUNTER — Telehealth: Payer: Self-pay

## 2022-12-28 ENCOUNTER — Other Ambulatory Visit: Payer: Self-pay

## 2022-12-28 DIAGNOSIS — I639 Cerebral infarction, unspecified: Secondary | ICD-10-CM

## 2022-12-28 LAB — HEMOGLOBIN A1C
Hgb A1c MFr Bld: 6.3 % — ABNORMAL HIGH (ref 4.8–5.6)
Mean Plasma Glucose: 134 mg/dL

## 2022-12-28 NOTE — Telephone Encounter (Signed)
Per Dr.Emokpae 30 day event monitor ordered for cva

## 2022-12-28 NOTE — Telephone Encounter (Signed)
Patients husband called to get a sooner appt than 02/2023. Diana Smith had a mini stroke last Saturday and they said she needs to be seen soon. They done an MRI/MRA there is no blockage. Please call both numbers on file

## 2022-12-29 NOTE — ED Provider Notes (Signed)
[facility] MEDICAL SURGICAL UNIT Provider Note   CSN: [id number] Arrival date & time: 12/26/22  1041     History  Chief Complaint  Patient presents with   Altered Mental Status    Diana Smith is a 77 y.o. female.  Patient has a history of dementia.  According to the husband, the patient woke up this morning more confused than normal.  Last time this happened she had a UTI.  The history is provided by a relative. No language interpreter was used.  Altered Mental Status Presenting symptoms: behavior changes and confusion   Severity:  Moderate Most recent episode:  Today Episode history:  Continuous Timing:  Constant Chronicity:  Recurrent Context: not alcohol use        Home Medications Prior to Admission medications   Medication Sig Start Date End Date Taking? Authorizing Provider  clopidogrel (PLAVIX) 75 MG tablet Take 1 tablet (75 mg total) by mouth daily for 30 doses. 12/27/22 01/26/23 Yes Emokpae, Courage, MD  rosuvastatin (CRESTOR) 20 MG tablet Take 1 tablet (20 mg total) by mouth daily. 12/27/22 12/27/23 Yes Emokpae, Courage, MD  ALPRAZolam Prudy Feeler) 0.25 MG tablet Take 0.125-0.25 mg by mouth See admin instructions. Take 1/2 tablet by mouth daily if needed take 1 tablet as needed for anxiety 12/23/20   [provider]  aspirin EC 81 MG tablet Take 1 tablet (81 mg total) by mouth daily. Swallow whole. 12/28/22   Shon Hale, MD  Biotin 1000 MCG tablet Take 1,000 mcg by mouth daily.    [provider]  calcium carbonate (OSCAL) 1500 (600 Ca) MG TABS tablet Take 1,500 mg by mouth daily with breakfast.    [provider]  escitalopram (LEXAPRO) 5 MG tablet Take 5 mg by mouth daily. 04/12/22   [provider]  Melatonin 10 MG CAPS Take 10 mg by mouth at bedtime.    [provider]  memantine (NAMENDA) 10 MG tablet  08/06/22   [provider]  omeprazole (PRILOSEC) 20 MG capsule Take 2 capsules (40 mg total) by mouth daily.  04/26/22   Vassie Loll, MD  Probiotic Product (PROBIOTIC DAILY PO) Take 1 Capful by mouth daily.    [provider]  thiamine (VITAMIN B-1) 100 MG tablet Take 1 tablet (100 mg total) by mouth daily. 04/27/22   Vassie Loll, MD  valsartan (DIOVAN) 40 MG tablet Take 1 tablet (40 mg total) by mouth daily. 04/26/22   Vassie Loll, MD  vitamin B-12 (CYANOCOBALAMIN) 500 MCG tablet Take 500 mcg by mouth daily.    [provider]      Allergies    Dynacin [minocycline hcl], Gatifloxacin, Lorabid [loracarbef], and Minocycline    Review of Systems   Review of Systems  Unable to perform ROS: Mental status change  Psychiatric/Behavioral:  Positive for confusion.     Physical Exam Updated Vital Signs BP 133/68 (BP Location: Left Arm)   Pulse 66   Temp 98.6 F (37 C) (Oral)   Resp 17   Ht 5\' 5"  (1.651 m)   Wt 76.2 kg   SpO2 97%   BMI 27.96 kg/m  Physical Exam Vitals and nursing note reviewed.  Constitutional:      Appearance: She is well-developed.  HENT:     Head: Normocephalic.     Nose: Nose normal.  Eyes:     General: No scleral icterus.    Conjunctiva/sclera: Conjunctivae normal.  Neck:     Thyroid: No thyromegaly.  Cardiovascular:  Rate and Rhythm: Normal rate and regular rhythm.     Heart sounds: No murmur heard.    No friction rub. No gallop.  Pulmonary:     Breath sounds: No stridor. No wheezing or rales.  Chest:     Chest wall: No tenderness.  Abdominal:     General: There is no distension.     Tenderness: There is no abdominal tenderness. There is no rebound.  Musculoskeletal:        General: Normal range of motion.     Cervical back: Neck supple.  Lymphadenopathy:     Cervical: No cervical adenopathy.  Skin:    Findings: No erythema or rash.  Neurological:     Mental Status: She is alert.     Motor: No abnormal muscle tone.     Coordination: Coordination normal.     Comments: Oriented to person only  Psychiatric:         Behavior: Behavior normal.     ED Results / Procedures / Treatments   Labs (all labs ordered are listed, but only abnormal results are displayed) Labs Reviewed  COMPREHENSIVE METABOLIC PANEL - Abnormal; Notable for the following components:      Result Value   Glucose, Bld 117 (*)    Calcium 8.8 (*)    All other components within normal limits  URINALYSIS, ROUTINE W REFLEX MICROSCOPIC - Abnormal; Notable for the following components:   Leukocytes,Ua MODERATE (*)    All other components within normal limits  HEMOGLOBIN A1C - Abnormal; Notable for the following components:   Hgb A1c MFr Bld 6.3 (*)    All other components within normal limits  VITAMIN B12 - Abnormal; Notable for the following components:   Vitamin B-12 4,548 (*)    All other components within normal limits  CBC WITH DIFFERENTIAL/PLATELET  LIPID PANEL    EKG EKG Interpretation Date/Time:  Sunday December 26 2022 12:09:30 EDT Ventricular Rate:  69 PR Interval:  171 QRS Duration:  148 QT Interval:  450 QTC Calculation: 483 R Axis:   254  Text Interpretation: Sinus rhythm RBBB and LAFB Confirmed by Glyn Ade [phone]) on 12/27/2022 6:08:39 PM  Radiology MR ANGIO HEAD WO CONTRAST  Result Date: 12/27/2022 CLINICAL DATA:  Stroke follow-up EXAM: MRA NECK WITHOUT CONTRAST MRA HEAD WITHOUT CONTRAST TECHNIQUE: Angiographic images of the Circle of Willis were acquired using MRA technique without intravenous contrast. COMPARISON:  None Available. FINDINGS: MRA NECK FINDINGS Aortic arch: Limited visualization due to technique. No visible abnormality. Right carotid system: Normal Left carotid system: Normal Vertebral arteries: Left dominant. Visualized portions are patent without stenosis. Other: None. MRA HEAD FINDINGS POSTERIOR CIRCULATION: --Vertebral arteries: Normal --Inferior cerebellar arteries: Normal. --Basilar artery: Normal. --Superior cerebellar arteries: Normal. --Posterior cerebral arteries: Normal. ANTERIOR  CIRCULATION: --Intracranial internal carotid arteries: Normal. --Anterior cerebral arteries (ACA): Normal. --Middle cerebral arteries (MCA): Multifocal moderate atherosclerotic stenosis of the right MCA M2 and M3 branches. No proximal occlusion. Anatomic variants: Diminutive right ACA A1 segment. Other: None. IMPRESSION: 1. No emergent large vessel occlusion or high-grade stenosis. 2. Multifocal moderate atherosclerotic stenosis of the right MCA M2 and M3 branches. Electronically Signed   By: Deatra Robinson M.D.   On: 12/27/2022 19:43   MR ANGIO NECK WO CONTRAST  Result Date: 12/27/2022 CLINICAL DATA:  Stroke follow-up EXAM: MRA NECK WITHOUT CONTRAST MRA HEAD WITHOUT CONTRAST TECHNIQUE: Angiographic images of the Circle of Willis were acquired using MRA technique without intravenous contrast. COMPARISON:  None Available. FINDINGS: MRA NECK FINDINGS  Aortic arch: Limited visualization due to technique. No visible abnormality. Right carotid system: Normal Left carotid system: Normal Vertebral arteries: Left dominant. Visualized portions are patent without stenosis. Other: None. MRA HEAD FINDINGS POSTERIOR CIRCULATION: --Vertebral arteries: Normal --Inferior cerebellar arteries: Normal. --Basilar artery: Normal. --Superior cerebellar arteries: Normal. --Posterior cerebral arteries: Normal. ANTERIOR CIRCULATION: --Intracranial internal carotid arteries: Normal. --Anterior cerebral arteries (ACA): Normal. --Middle cerebral arteries (MCA): Multifocal moderate atherosclerotic stenosis of the right MCA M2 and M3 branches. No proximal occlusion. Anatomic variants: Diminutive right ACA A1 segment. Other: None. IMPRESSION: 1. No emergent large vessel occlusion or high-grade stenosis. 2. Multifocal moderate atherosclerotic stenosis of the right MCA M2 and M3 branches. Electronically Signed   By: Deatra Robinson M.D.   On: 12/27/2022 19:43    Procedures Procedures    Medications Ordered in ED Medications  sodium chloride  0.9 % bolus 1,000 mL (0 mLs Intravenous Stopped 12/26/22 1324)   stroke: early stages of recovery book ( Does not apply Given 12/27/22 0839)  gadobutrol (GADAVIST) 1 MMOL/ML injection 7.5 mL (7.5 mLs Intravenous Contrast Given 12/27/22 1015)    ED Course/ Medical Decision Making/ A&P                             Medical Decision Making Amount and/or Complexity of Data Reviewed Labs: ordered. Radiology: ordered. ECG/medicine tests: ordered.  Risk Decision regarding hospitalization.  This patient presents to the ED for concern of confusion, this involves an extensive number of treatment options, and is a complaint that carries with it a high risk of complications and morbidity.  The differential diagnosis includes stroke, infection   Co morbidities that complicate the patient evaluation  Dementia   Additional history obtained:  Additional history obtained from family External records from outside source obtained and reviewed including hospital records   Lab Tests:  I Ordered, and personally interpreted labs.  The pertinent results include: CBC unremarkable   Imaging Studies ordered:  I ordered imaging studies including CT head I independently visualized and interpreted imaging which showed no acute changes I agree with the radiologist interpretation   Cardiac Monitoring: / EKG:  The patient was maintained on a cardiac monitor.  I personally viewed and interpreted the cardiac monitored which showed an underlying rhythm of: His rhythm   Consultations Obtained:  I requested consultation with the neurology and hospitalist,  and discussed lab and imaging findings as well as pertinent plan - they recommend: Hospitalist admit with MRI tomorrow   Problem List / ED Course / Critical interventions / Medication management  Dementia and worsening confusion I ordered medication including normal saline for dehydration Reevaluation of the patient after these medicines showed that  the patient stayed the same I have reviewed the patients home medicines and have made adjustments as needed   Social Determinants of Health:  None   Test / Admission - Considered:  None  Patient with confusion and dementia.  I spoke to neurology and they recommend having patient admitted for observation and getting an MRI in the morning        Final Clinical Impression(s) / ED Diagnoses Final diagnoses:  None    Rx / DC Orders ED Discharge Orders          Ordered    aspirin EC 81 MG tablet  Daily        12/27/22 1910    rosuvastatin (CRESTOR) 20 MG tablet  Daily  12/27/22 1910    clopidogrel (PLAVIX) 75 MG tablet  Daily        12/27/22 1910    Increase activity slowly        12/27/22 1910    Diet - low sodium heart healthy        12/27/22 1910    Discharge instructions       Comments: 1)Please take Aspirin 81 mg daily along with Plavix 75 mg daily for 21 days then after that STOP the Plavix  and continue ONLY Aspirin 81 mg daily indefinitely--for secondary stroke Prevention   2)Increase Crestor (Rosuvastatin) to 20 mg daily for stroke prevention--  3)Follow up with Neurologist Dr. Pearlean Brownie in about 4 to 6 weeks  4)They will mail a 30-day event/heart monitor to your home address- to help look for irregular heartbeat (Atrial Fibrillation/Atrial Flutter-- please follow the instructions on the box and return it as advised   12/27/22 1910    Call MD for:  temperature >100.4        12/27/22 1910    Call MD for:  persistant nausea and vomiting        12/27/22 1910    Call MD for:  difficulty breathing, headache or visual disturbances        12/27/22 1910    Call MD for:  persistant dizziness or light-headedness        12/27/22 1910              Bethann Berkshire, MD 12/29/22 1128

## 2023-01-03 ENCOUNTER — Encounter: Payer: Self-pay | Admitting: Physician Assistant

## 2023-01-03 ENCOUNTER — Ambulatory Visit: Payer: Medicare HMO | Admitting: Physician Assistant

## 2023-01-03 VITALS — BP 126/84 | HR 84 | Ht 65.0 in | Wt 168.0 lb

## 2023-01-03 DIAGNOSIS — G459 Transient cerebral ischemic attack, unspecified: Secondary | ICD-10-CM | POA: Diagnosis not present

## 2023-01-03 DIAGNOSIS — I700 Atherosclerosis of aorta: Secondary | ICD-10-CM | POA: Diagnosis not present

## 2023-01-03 DIAGNOSIS — Z79899 Other long term (current) drug therapy: Secondary | ICD-10-CM | POA: Diagnosis not present

## 2023-01-03 DIAGNOSIS — G309 Alzheimer's disease, unspecified: Secondary | ICD-10-CM | POA: Diagnosis not present

## 2023-01-03 DIAGNOSIS — F0280 Dementia in other diseases classified elsewhere without behavioral disturbance: Secondary | ICD-10-CM

## 2023-01-03 DIAGNOSIS — G301 Alzheimer's disease with late onset: Secondary | ICD-10-CM | POA: Diagnosis not present

## 2023-01-03 DIAGNOSIS — F0283 Dementia in other diseases classified elsewhere, unspecified severity, with mood disturbance: Secondary | ICD-10-CM | POA: Diagnosis not present

## 2023-01-03 NOTE — Progress Notes (Signed)
[facility] Neurology Division Clinic Note - Initial Visit   Date: 01/03/23  [name] MRN: [id number] DOB: 11-30-1945   Recent TIA  Continue aspirin 81 mg daily and Plavix 75 mg to complete a 6-month therapy Hospital neurology recommendations Continue secondary stroke prevention with rosuvastatin 20 mg daily and monitor blood pressure carefully Agree with 30-day event monitor to evaluate for PAF Return to clinic in 3  months.  Dementia due to Alzheimer's disease  Continue memantine 10 mg bid She had side effects with donepezil (GI). She wants to hold  tying any new antidementia meds for now, will discuss again in Sept 20204  Follow up in 9/20204  Continue to control mood as per psychiatry  Recommend good control of cardiovascular risk factors.      History of Present Illness:  Diana Smith is a 77 y.o. R-handed female with a history of OCD, anxiety, depression, dementia likely due to Alzheimer's disease,  presenting for evaluation of recent TIA on 12/26/2022 . Patient presented to the emergency department with dizziness, and generalized malaise.  Her husband brought her to the ED because in the past, when she had UTI had similar symptoms further workup included an MRI of the brain performed  which did  reveal acute punctate infarct in the left temporal occipital region.  Patient  never had a similar episode. Denies any history of TIA. Denies vertigo dizziness or vision changes. Denies headaches, dysarthria or dysphagia. The only symptoms was confusion that lasted 2 hours without seizures. Denies any chest pain, or shortness of breath. Denies any fever or chills, or night sweats. No tobacco. No new meds or hormonal supplements. Does take a regular ASA a day, and was placed on additional Plavix 75 mg daily for 3 months instead of 3 weeks as per neurology recommendations.   Denies any recent long distance trips or recent surgeries. No sick contacts. Mother in ALF since  08/2022 and that caused stress. She was not on ASA when admitted to the hospital Patient was very active, exercising daily until March, then was not as active. No family history of stroke Patient was not administered TPA was beyond time window for treatment consideration.  No thrombectomy was indicated because no LVO was seen.  Out-side paper records, electronic medical record, and images have been reviewed where available and summarized as:     MRI of the brain with and without contrast 12/27/2022 showing punctate acute cortical infarct at the junction of the posterior left temporal and occipital lobes (probably posterior left MCA territory), without associated hemorrhage or mass effect, no other acute intracranial abnormality.  Mild to moderate chronic small vessel disease.  MRA of the head and neck personally reviewed (12/27/2022), without emergent LVO or high-grade stenosis. Multifocal moderate atherosclerotic stenosis of the right MCA M2 and M3 branches.   Labs LDL 98, HDL 47 2D echo showed EF 60 to 65%, grade 1 diastolic dysfunction, no mitral stenosis or aortic stenosis, no regional wall motion abnormalities  Lab Results  Component Value Date   HGBA1C 6.3 (H) 12/26/2022   Lab Results  Component Value Date   VITAMINB12 4,548 (H) 12/26/2022   No results found for: "TSH" No results found for: "ESRSEDRATE", "POCTSEDRATE"  Past Medical History:  Diagnosis Date   Abdominal pain 04/24/2022   Actinic keratosis    Allergic rhinitis due to pollen    Cerebrovascular disease    Dementia due to Alzheimer's disease 09/10/2022   Essential hypertension 04/24/2022   Frequent urinary  tract infections    Gastro-esophageal reflux disease without esophagitis    Generalized anxiety disorder    Heartburn    Hematochezia    History of COVID-19    Hyperglycemia    Hyperlipidemia    Hypoalbuminemia due to protein-calorie malnutrition 04/24/2022   Insomnia 04/24/2022   Major depressive disorder     Nonalcoholic steatohepatitis (NASH)    Osteoarthritis of left knee 03/21/2022   Osteoarthritis of right knee 01/25/2021   Overactive bladder    Personal history of colonic polyps    Prediabetes    Pulmonary nodule 04/24/2022   Pure hypercholesterolemia    Right bundle branch block    Rosacea    Sciatica, right side    Slow transit constipation    Starvation ketoacidosis 04/24/2022   UTI (urinary tract infection) 04/24/2022   Vitamin B12 deficiency (non anemic)    Vitamin D deficiency     Past Surgical History:  Procedure Laterality Date   HYSTEROTOMY     MASS EXCISION  10/08/2011   Procedure: MINOR EXCISION OF MASS;  Surgeon: Wyn Forster., MD;  Location: [facility];  Service: Orthopedics;  Laterality: Left;  excisional biopsy dorsum left long finger MCP joint     Medications:  Outpatient Encounter Medications as of 01/03/2023  Medication Sig   ALPRAZolam (XANAX) 0.25 MG tablet Take 0.125-0.25 mg by mouth See admin instructions. Take 1/2 tablet by mouth daily if needed take 1 tablet as needed for anxiety   aspirin EC 81 MG tablet Take 1 tablet (81 mg total) by mouth daily. Swallow whole.   Biotin 1000 MCG tablet Take 1,000 mcg by mouth daily.   calcium carbonate (OSCAL) 1500 (600 Ca) MG TABS tablet Take 1,500 mg by mouth daily with breakfast.   clopidogrel (PLAVIX) 75 MG tablet Take 1 tablet (75 mg total) by mouth daily for 30 doses.   escitalopram (LEXAPRO) 5 MG tablet Take 5 mg by mouth daily.   Melatonin 10 MG CAPS Take 10 mg by mouth at bedtime.   memantine (NAMENDA) 10 MG tablet    omeprazole (PRILOSEC) 20 MG capsule Take 2 capsules (40 mg total) by mouth daily.   Probiotic Product (PROBIOTIC DAILY PO) Take 1 Capful by mouth daily.   rosuvastatin (CRESTOR) 20 MG tablet Take 1 tablet (20 mg total) by mouth daily.   thiamine (VITAMIN B-1) 100 MG tablet Take 1 tablet (100 mg total) by mouth daily.   valsartan (DIOVAN) 40 MG tablet Take 1 tablet (40 mg  total) by mouth daily.   vitamin B-12 (CYANOCOBALAMIN) 500 MCG tablet Take 500 mcg by mouth daily.   No facility-administered encounter medications on file as of 01/03/2023.    Allergies:  Allergies  Allergen Reactions   Dynacin [Minocycline Hcl]    Gatifloxacin Other (See Comments)   Lorabid [Loracarbef]    Minocycline Other (See Comments)    Family History: Family History  Problem Relation Age of Onset   Dementia Father    Memory loss Father    Breast cancer Neg Hx     Social History: Social History   Tobacco Use   Smoking status: Never   Smokeless tobacco: Never  Vaping Use   Vaping Use: Never used  Substance Use Topics   Alcohol use: Never   Drug use: Never   Social History   Social History Narrative   Left handed    Lives with husband    Retired   Caffeine prn   No falls  Vital Signs:  BP 126/84   Pulse 84   Ht 5\' 5"  (1.651 m)   Wt 168 lb (76.2 kg)   BMI 27.96 kg/m    General Medical Exam:   General:  Well appearing, comfortable.   Eyes/ENT: see cranial nerve examination.   Neck:   No carotid bruits. Respiratory:  Clear to auscultation, good air entry bilaterally.   Cardiac:  Regular rate and rhythm, no murmur.   Extremities:  No deformities, edema, or skin discoloration.  Skin:  No rashes or lesions.  Neurological Exam: MENTAL STATUS including orientation to time, place, person, recent and remote memory, attention span and concentration, language, and fund of knowledge is reduced  Speech is not dysarthric.  CRANIAL NERVES: II:  No visual field defects.  Unremarkable fundi.   III-IV-VI: Pupils equal round and reactive to light.  Normal conjugate, extra-ocular eye movements in all directions of gaze.  No nystagmus.  No ptosis .   V:  Normal facial sensation.    VII:  Normal facial symmetry and movements.   VIII:  Normal hearing and vestibular function.   IX-X:  Normal palatal movement.   XI:  Normal shoulder shrug and head rotation.   XII:   Normal tongue strength and range of motion, no deviation or fasciculation.  MOTOR:  No atrophy, fasciculations or abnormal movements.  No pronator drift.    SENSORY:  Normal and symmetric perception of light touch, pinprick, vibration, and proprioception.  Romberg's sign absent.   COORDINATION/GAIT: Normal finger-to- nose-finger and heel-to-shin.  Intact rapid alternating movements bilaterally.  Able to rise from a chair without using arms.  Gait narrow based and stable. Tandem and stressed gait intact.     Total time spent:  33 mins    Thank you for allowing me to participate in patient's care.  If I can answer any additional questions, I would be pleased to do so.    Sincerely,   Marlowe Kays, PA-C

## 2023-01-03 NOTE — Patient Instructions (Addendum)
It was a pleasure to see you today at our office.   Recommendations:  Follow up in  6 months Continue memantine as per your doctor Continue Plavix and Aspirin Continue the heart medicine and the cholesterol medicine  Agree with the monitor Follow up with psychiatry    Whom to call:  Memory  decline, memory medications: Call our office [phone]   For psychiatric meds, mood meds: Please have your primary care physician manage these medications.     For assessment of decision of mental capacity and competency:  Call Dr. Erick Blinks, geriatric psychiatrist at [phone]       If you have any severe symptoms of a stroke, or other severe issues such as confusion,severe chills or fever, etc call 911 or go to the ER as you may need to be evaluated further      RECOMMENDATIONS FOR ALL PATIENTS WITH MEMORY PROBLEMS: 1. Continue to exercise (Recommend 30 minutes of walking everyday, or 3 hours every week) 2. Increase social interactions - continue going to [place] and enjoy social gatherings with friends and family 3. Eat healthy, avoid fried foods and eat more fruits and vegetables 4. Maintain adequate blood pressure, blood sugar, and blood cholesterol level. Reducing the risk of stroke and cardiovascular disease also helps promoting better memory. 5. Avoid stressful situations. Live a simple life and avoid aggravations. Organize your time and prepare for the next day in anticipation. 6. Sleep well, avoid any interruptions of sleep and avoid any distractions in the bedroom that may interfere with adequate sleep quality 7. Avoid sugar, avoid sweets as there is a strong link between excessive sugar intake, diabetes, and cognitive impairment We discussed the Mediterranean diet, which has been shown to help patients reduce the risk of progressive memory disorders and reduces cardiovascular risk. This includes eating fish, eat fruits and green leafy vegetables, nuts like almonds and  hazelnuts, walnuts, and also use olive oil. Avoid fast foods and fried foods as much as possible. Avoid sweets and sugar as sugar use has been linked to worsening of memory function.  There is always a concern of gradual progression of memory problems. If this is the case, then we may need to adjust level of care according to patient needs. Support, both to the patient and caregiver, should then be put into place.    FALL PRECAUTIONS: Be cautious when walking. Scan the area for obstacles that may increase the risk of trips and falls. When getting up in the mornings, sit up at the edge of the bed for a few minutes before getting out of bed. Consider elevating the bed at the head end to avoid drop of blood pressure when getting up. Walk always in a well-lit room (use night lights in the walls). Avoid area rugs or power cords from appliances in the middle of the walkways. Use a walker or a cane if necessary and consider physical therapy for balance exercise. Get your eyesight checked regularly.  FINANCIAL OVERSIGHT: Supervision, especially oversight when making financial decisions or transactions is also recommended.  HOME SAFETY: Consider the safety of the kitchen when operating appliances like stoves, microwave oven, and blender. Consider having supervision and share cooking responsibilities until no longer able to participate in those. Accidents with firearms and other hazards in the house should be identified and addressed as well.   ABILITY TO BE LEFT ALONE: If patient is unable to contact 911 operator, consider using LifeLine, or when the need is there, arrange for someone  to stay with patients. Smoking is a fire hazard, consider supervision or cessation. Risk of wandering should be assessed by caregiver and if detected at any point, supervision and safe proof recommendations should be instituted.  MEDICATION SUPERVISION: Inability to self-administer medication needs to be constantly addressed.  Implement a mechanism to ensure safe administration of the medications.   DRIVING: Regarding driving, in patients with progressive memory problems, driving will be impaired. We advise to have someone else do the driving if trouble finding directions or if minor accidents are reported. Independent driving assessment is available to determine safety of driving.   If you are interested in the driving assessment, you can contact the following:  [employer] in [place] [phone]  [facility] [phone]  [facility] [phone]  [facility] [phone] or [phone]

## 2023-01-19 DIAGNOSIS — G464 Cerebellar stroke syndrome: Secondary | ICD-10-CM | POA: Diagnosis not present

## 2023-01-24 ENCOUNTER — Ambulatory Visit: Payer: Medicare HMO | Attending: Cardiology

## 2023-01-24 DIAGNOSIS — I639 Cerebral infarction, unspecified: Secondary | ICD-10-CM

## 2023-03-01 DIAGNOSIS — Z79899 Other long term (current) drug therapy: Secondary | ICD-10-CM | POA: Diagnosis not present

## 2023-03-01 DIAGNOSIS — Z8673 Personal history of transient ischemic attack (TIA), and cerebral infarction without residual deficits: Secondary | ICD-10-CM | POA: Diagnosis not present

## 2023-03-01 DIAGNOSIS — E785 Hyperlipidemia, unspecified: Secondary | ICD-10-CM | POA: Diagnosis not present

## 2023-03-01 DIAGNOSIS — Z9181 History of falling: Secondary | ICD-10-CM | POA: Diagnosis not present

## 2023-03-01 DIAGNOSIS — E559 Vitamin D deficiency, unspecified: Secondary | ICD-10-CM | POA: Diagnosis not present

## 2023-03-01 DIAGNOSIS — Z23 Encounter for immunization: Secondary | ICD-10-CM | POA: Diagnosis not present

## 2023-03-01 DIAGNOSIS — R7303 Prediabetes: Secondary | ICD-10-CM | POA: Diagnosis not present

## 2023-03-01 DIAGNOSIS — F331 Major depressive disorder, recurrent, moderate: Secondary | ICD-10-CM | POA: Diagnosis not present

## 2023-03-01 DIAGNOSIS — F0393 Unspecified dementia, unspecified severity, with mood disturbance: Secondary | ICD-10-CM | POA: Diagnosis not present

## 2023-03-01 DIAGNOSIS — Z0000 Encounter for general adult medical examination without abnormal findings: Secondary | ICD-10-CM | POA: Diagnosis not present

## 2023-03-01 DIAGNOSIS — I700 Atherosclerosis of aorta: Secondary | ICD-10-CM | POA: Diagnosis not present

## 2023-03-21 DIAGNOSIS — G309 Alzheimer's disease, unspecified: Secondary | ICD-10-CM | POA: Diagnosis not present

## 2023-03-21 DIAGNOSIS — F330 Major depressive disorder, recurrent, mild: Secondary | ICD-10-CM | POA: Diagnosis not present

## 2023-03-21 DIAGNOSIS — F02818 Dementia in other diseases classified elsewhere, unspecified severity, with other behavioral disturbance: Secondary | ICD-10-CM | POA: Diagnosis not present

## 2023-03-21 DIAGNOSIS — F411 Generalized anxiety disorder: Secondary | ICD-10-CM | POA: Diagnosis not present

## 2023-03-25 ENCOUNTER — Ambulatory Visit: Payer: Medicare HMO | Admitting: Physician Assistant

## 2023-06-10 DIAGNOSIS — M170 Bilateral primary osteoarthritis of knee: Secondary | ICD-10-CM | POA: Diagnosis not present

## 2023-06-27 DIAGNOSIS — F411 Generalized anxiety disorder: Secondary | ICD-10-CM | POA: Diagnosis not present

## 2023-06-27 DIAGNOSIS — F02818 Dementia in other diseases classified elsewhere, unspecified severity, with other behavioral disturbance: Secondary | ICD-10-CM | POA: Diagnosis not present

## 2023-06-27 DIAGNOSIS — G309 Alzheimer's disease, unspecified: Secondary | ICD-10-CM | POA: Diagnosis not present

## 2023-06-27 DIAGNOSIS — F330 Major depressive disorder, recurrent, mild: Secondary | ICD-10-CM | POA: Diagnosis not present

## 2023-06-29 ENCOUNTER — Emergency Department (HOSPITAL_COMMUNITY): Payer: Medicare HMO

## 2023-06-29 ENCOUNTER — Emergency Department (HOSPITAL_COMMUNITY)
Admission: EM | Admit: 2023-06-29 | Discharge: 2023-06-29 | Disposition: A | Discharge status: Home / Self Care | Payer: Medicare HMO | Attending: Emergency Medicine | Admitting: Emergency Medicine

## 2023-06-29 ENCOUNTER — Other Ambulatory Visit: Payer: Self-pay

## 2023-06-29 ENCOUNTER — Encounter (HOSPITAL_COMMUNITY): Payer: Self-pay | Admitting: Emergency Medicine

## 2023-06-29 DIAGNOSIS — Z79899 Other long term (current) drug therapy: Secondary | ICD-10-CM | POA: Insufficient documentation

## 2023-06-29 DIAGNOSIS — U071 COVID-19: Secondary | ICD-10-CM | POA: Diagnosis not present

## 2023-06-29 DIAGNOSIS — F0390 Unspecified dementia without behavioral disturbance: Secondary | ICD-10-CM | POA: Insufficient documentation

## 2023-06-29 DIAGNOSIS — R059 Cough, unspecified: Secondary | ICD-10-CM | POA: Diagnosis not present

## 2023-06-29 DIAGNOSIS — Z7982 Long term (current) use of aspirin: Secondary | ICD-10-CM | POA: Diagnosis not present

## 2023-06-29 DIAGNOSIS — I10 Essential (primary) hypertension: Secondary | ICD-10-CM | POA: Diagnosis not present

## 2023-06-29 NOTE — ED Triage Notes (Signed)
 Pts husband reports she was dx with COVID on Christmas. Pt reports she feels bad and wanted to be checked.

## 2023-06-29 NOTE — Discharge Instructions (Signed)
 Thankfully the x-ray does not show any signs of pneumonia, you may continue to use the benzonatate  cough medication, the antibiotic is unnecessary, you may stop that at this time.  Please see your primary care doctor within the next week for recheck, ER for worsening symptoms

## 2023-06-29 NOTE — ED Provider Notes (Signed)
 [place] EMERGENCY DEPARTMENT AT [facility] Provider Note   CSN: [id number] Arrival date & time: 06/29/23  0932     History  Chief Complaint  Patient presents with   Generalized Body Aches    Diana Smith is a 78 y.o. female.  HPI   This patient is a 78 year old female recently diagnosed with COVID-19 as of about a week ago, she has a history of dementia, her husband gives the primary history.  Evidently the patient has been on amoxicillin and Tessalon  for the last week, she continues to cough, there has been no other symptoms, today she did not want to take the medications and the husband wants her to be rechecked to make sure there is nothing else going on.  No fevers no vomiting no diarrhea  Home Medications Prior to Admission medications   Medication Sig Start Date End Date Taking? Authorizing Provider  ALPRAZolam  (XANAX ) 0.25 MG tablet Take 0.125-0.25 mg by mouth See admin instructions. Take 1/2 tablet by mouth daily if needed take 1 tablet as needed for anxiety 12/23/20   [provider]  aspirin  EC 81 MG tablet Take 1 tablet (81 mg total) by mouth daily. Swallow whole. 12/28/22   Pearlean Manus, MD  Biotin  1000 MCG tablet Take 1,000 mcg by mouth daily.    [provider]  calcium  carbonate (OSCAL) 1500 (600 Ca) MG TABS tablet Take 1,500 mg by mouth daily with breakfast.    [provider]  escitalopram  (LEXAPRO ) 5 MG tablet Take 5 mg by mouth daily. 04/12/22   [provider]  Melatonin 10 MG CAPS Take 10 mg by mouth at bedtime.    [provider]  memantine  (NAMENDA ) 10 MG tablet  08/06/22   [provider]  omeprazole  (PRILOSEC) 20 MG capsule Take 2 capsules (40 mg total) by mouth daily. 04/26/22   Ricky Fines, MD  Probiotic Product (PROBIOTIC DAILY PO) Take 1 Capful by mouth daily.    [provider]  rosuvastatin  (CRESTOR ) 20 MG tablet Take 1 tablet (20 mg total) by mouth daily. 12/27/22 12/27/23   Pearlean Manus, MD  thiamine  (VITAMIN B-1) 100 MG tablet Take 1 tablet (100 mg total) by mouth daily. 04/27/22   Ricky Fines, MD  valsartan  (DIOVAN ) 40 MG tablet Take 1 tablet (40 mg total) by mouth daily. 04/26/22   Ricky Fines, MD  vitamin B-12 (CYANOCOBALAMIN ) 500 MCG tablet Take 500 mcg by mouth daily.    [provider]      Allergies    Dynacin [minocycline hcl], Gatifloxacin, Lorabid [loracarbef], and Minocycline    Review of Systems   Review of Systems  All other systems reviewed and are negative.   Physical Exam Updated Vital Signs BP (!) 163/65   Pulse 66   Temp 98.7 F (37.1 C) (Oral)   Resp 15   SpO2 97%  Physical Exam Vitals and nursing note reviewed.  Constitutional:      General: She is not in acute distress.    Appearance: She is well-developed.  HENT:     Head: Normocephalic and atraumatic.     Mouth/Throat:     Pharynx: No oropharyngeal exudate.  Eyes:     General: No scleral icterus.       Right eye: No discharge.        Left eye: No discharge.     Conjunctiva/sclera: Conjunctivae normal.     Pupils: Pupils are equal, round, and reactive to light.  Neck:  Thyroid : No thyromegaly.     Vascular: No JVD.  Cardiovascular:     Rate and Rhythm: Normal rate and regular rhythm.     Heart sounds: Normal heart sounds. No murmur heard.    No friction rub. No gallop.  Pulmonary:     Effort: Pulmonary effort is normal. No respiratory distress.     Breath sounds: Normal breath sounds. No wheezing or rales.  Abdominal:     General: Bowel sounds are normal. There is no distension.     Palpations: Abdomen is soft. There is no mass.     Tenderness: There is no abdominal tenderness.  Musculoskeletal:        General: No tenderness. Normal range of motion.     Cervical back: Normal range of motion and neck supple.  Lymphadenopathy:     Cervical: No cervical adenopathy.  Skin:    General: Skin is warm and dry.     Findings: No erythema or  rash.  Neurological:     Mental Status: She is alert.     Coordination: Coordination normal.  Psychiatric:        Behavior: Behavior normal.     ED Results / Procedures / Treatments   Labs (all labs ordered are listed, but only abnormal results are displayed) Labs Reviewed - No data to display  EKG None  Radiology DG Chest 2 View Result Date: 06/29/2023 CLINICAL DATA:  Cough. EXAM: CHEST - 2 VIEW COMPARISON:  12/26/2022 FINDINGS: The cardio pericardial silhouette is enlarged. The lungs are clear without focal pneumonia, edema, pneumothorax or pleural effusion. No acute bony abnormality. IMPRESSION: Enlargement of the cardiopericardial silhouette without acute cardiopulmonary findings. Electronically Signed   By: Camellia Candle M.D.   On: 06/29/2023 12:24    Procedures Procedures    Medications Ordered in ED Medications - No data to display  ED Course/ Medical Decision Making/ A&P                                 Medical Decision Making Amount and/or Complexity of Data Reviewed Radiology: ordered.   The patient is in no distress, her exam is unremarkable, her oxygen is 98% on room air, she is not tachycardic febrile hypoxic or hypotensive.  Will obtain chest x-ray to make sure there is no complications otherwise the patient is stable for discharge  I personally viewed and interpreted the x-ray of the chest which shows no signs of pneumonia.  I agree with the radiologist interpretation.  Patient has mild hypertension but no other significant abnormal findings.  Home with benzonatate , can stop amoxicillin, stable for discharge, no respiratory infection at this time.  The husband was more concerned that she did not want to take her amoxicillin today, I do not think that is an issue as this does not appear bacterial at all        Final Clinical Impression(s) / ED Diagnoses Final diagnoses:  COVID-19    Rx / DC Orders ED Discharge Orders     None          Cleotilde Rogue, MD 06/29/23 1237

## 2023-07-14 DIAGNOSIS — R413 Other amnesia: Secondary | ICD-10-CM | POA: Diagnosis not present

## 2023-07-14 DIAGNOSIS — E7800 Pure hypercholesterolemia, unspecified: Secondary | ICD-10-CM | POA: Diagnosis not present

## 2023-08-16 DIAGNOSIS — I119 Hypertensive heart disease without heart failure: Secondary | ICD-10-CM | POA: Diagnosis not present

## 2023-08-16 DIAGNOSIS — Z8673 Personal history of transient ischemic attack (TIA), and cerebral infarction without residual deficits: Secondary | ICD-10-CM | POA: Diagnosis not present

## 2023-08-16 DIAGNOSIS — K219 Gastro-esophageal reflux disease without esophagitis: Secondary | ICD-10-CM | POA: Diagnosis not present

## 2023-08-16 DIAGNOSIS — F411 Generalized anxiety disorder: Secondary | ICD-10-CM | POA: Diagnosis not present

## 2023-08-16 DIAGNOSIS — F0630 Mood disorder due to known physiological condition, unspecified: Secondary | ICD-10-CM | POA: Diagnosis not present

## 2023-08-16 DIAGNOSIS — F0393 Unspecified dementia, unspecified severity, with mood disturbance: Secondary | ICD-10-CM | POA: Diagnosis not present

## 2023-08-16 DIAGNOSIS — E782 Mixed hyperlipidemia: Secondary | ICD-10-CM | POA: Diagnosis not present

## 2023-08-16 DIAGNOSIS — E119 Type 2 diabetes mellitus without complications: Secondary | ICD-10-CM | POA: Diagnosis not present

## 2023-08-16 DIAGNOSIS — F0280 Dementia in other diseases classified elsewhere without behavioral disturbance: Secondary | ICD-10-CM | POA: Diagnosis not present

## 2023-08-16 DIAGNOSIS — F5101 Primary insomnia: Secondary | ICD-10-CM | POA: Diagnosis not present

## 2023-08-17 DIAGNOSIS — I639 Cerebral infarction, unspecified: Secondary | ICD-10-CM | POA: Diagnosis not present

## 2023-08-17 DIAGNOSIS — E119 Type 2 diabetes mellitus without complications: Secondary | ICD-10-CM | POA: Diagnosis not present

## 2023-08-20 ENCOUNTER — Ambulatory Visit
Admission: EM | Admit: 2023-08-20 | Discharge: 2023-08-20 | Disposition: A | Discharge status: Home / Self Care | Payer: Medicare HMO | Attending: Family Medicine | Admitting: Family Medicine

## 2023-08-20 DIAGNOSIS — N390 Urinary tract infection, site not specified: Secondary | ICD-10-CM | POA: Insufficient documentation

## 2023-08-20 DIAGNOSIS — B372 Candidiasis of skin and nail: Secondary | ICD-10-CM | POA: Diagnosis not present

## 2023-08-20 LAB — POCT URINALYSIS DIP (MANUAL ENTRY)
Bilirubin, UA: NEGATIVE
Glucose, UA: NEGATIVE mg/dL
Ketones, POC UA: NEGATIVE mg/dL
Nitrite, UA: NEGATIVE
Protein Ur, POC: NEGATIVE mg/dL
Spec Grav, UA: 1.015
Urobilinogen, UA: 0.2 U/dL
pH, UA: 7.5

## 2023-08-20 MED ORDER — SULFAMETHOXAZOLE-TRIMETHOPRIM 800-160 MG PO TABS: 1.0000 | ORAL_TABLET | Freq: Two times a day (BID) | ORAL | 0 refills | Status: DC

## 2023-08-20 MED ORDER — FLUCONAZOLE 150 MG PO TABS: 150.0000 mg | ORAL_TABLET | Freq: Every day | ORAL | 0 refills | Status: DC

## 2023-08-20 NOTE — ED Provider Notes (Signed)
 [facility]    CSN: [id number] Arrival date & time: 08/20/23  1115      History   Chief Complaint No chief complaint on file.   HPI Diana Smith is a 78 y.o. female.   Patient with past medical history significant for dementia presenting today with husband who provides the majority of history as primary caregiver.  He states that she "just does not feel good" for the past week or so, frequent urination noticed.  Patient denies fevers, nausea, vomiting, vaginal symptoms.  Not tried anything over-the-counter for symptoms.  She does have a history of recurrent urinary tract infections.  She also states that they have been putting nystatin cream on area to the lower pannus that tends to get yeast infections.  She states the area is becoming more painful.    Past Medical History:  Diagnosis Date   Abdominal pain 04/24/2022   Actinic keratosis    Allergic rhinitis due to pollen    Cerebrovascular disease    Dementia due to Alzheimer's disease 09/10/2022   Essential hypertension 04/24/2022   Frequent urinary tract infections    Gastro-esophageal reflux disease without esophagitis    Generalized anxiety disorder    Heartburn    Hematochezia    History of COVID-19    Hyperglycemia    Hyperlipidemia    Hypoalbuminemia due to protein-calorie malnutrition 04/24/2022   Insomnia 04/24/2022   Major depressive disorder    Nonalcoholic steatohepatitis (NASH)    Osteoarthritis of left knee 03/21/2022   Osteoarthritis of right knee 01/25/2021   Overactive bladder    Personal history of colonic polyps    Prediabetes    Pulmonary nodule 04/24/2022   Pure hypercholesterolemia    Right bundle branch block    Rosacea    Sciatica, right side    Slow transit constipation    Starvation ketoacidosis 04/24/2022   UTI (urinary tract infection) 04/24/2022   Vitamin B12 deficiency (non anemic)    Vitamin D deficiency     Patient Active Problem List   Diagnosis Date Noted    TIA (transient ischemic attack) 12/26/2022   Dementia due to Alzheimer's disease 09/10/2022   Abdominal pain 04/24/2022   Hypoalbuminemia due to protein-calorie malnutrition 04/24/2022   Pulmonary nodule 04/24/2022   Essential hypertension 04/24/2022   Insomnia 04/24/2022   Actinic keratosis    Allergic rhinitis due to pollen    Cerebrovascular disease    History of COVID-19    Gastro-esophageal reflux disease without esophagitis    Generalized anxiety disorder    Hyperglycemia    Hyperlipidemia    Major depressive disorder    Nonalcoholic steatohepatitis (NASH)    Overactive bladder    History of colonic polyps    Prediabetes    Right bundle branch block    Rosacea    Sciatica, right side    Slow transit constipation    Vitamin B12 deficiency (non anemic)    Vitamin D deficiency    Osteoarthritis of right knee 01/25/2021    Past Surgical History:  Procedure Laterality Date   HYSTEROTOMY     MASS EXCISION  10/08/2011   Procedure: MINOR EXCISION OF MASS;  Surgeon: Wyn Forster., MD;  Location: [facility];  Service: Orthopedics;  Laterality: Left;  excisional biopsy dorsum left long finger MCP joint    OB History   No obstetric history on file.      Home Medications    Prior to Admission medications  Medication Sig Start Date End Date Taking? Authorizing Provider  fluconazole (DIFLUCAN) 150 MG tablet Take 1 tablet (150 mg total) by mouth daily. 08/20/23  Yes Particia Nearing, PA-C  sulfamethoxazole-trimethoprim (BACTRIM DS) 800-160 MG tablet Take 1 tablet by mouth 2 (two) times daily for 5 days. 08/20/23 08/25/23 Yes Particia Nearing, PA-C  ALPRAZolam Prudy Feeler) 0.25 MG tablet Take 0.125-0.25 mg by mouth See admin instructions. Take 1/2 tablet by mouth daily if needed take 1 tablet as needed for anxiety 12/23/20   [provider]  aspirin EC 81 MG tablet Take 1 tablet (81 mg total) by mouth daily. Swallow whole. 12/28/22   Shon Hale, MD  Biotin 1000 MCG tablet Take 1,000 mcg by mouth daily.    [provider]  calcium carbonate (OSCAL) 1500 (600 Ca) MG TABS tablet Take 1,500 mg by mouth daily with breakfast.    [provider]  escitalopram (LEXAPRO) 5 MG tablet Take 5 mg by mouth daily. 04/12/22   [provider]  Melatonin 10 MG CAPS Take 10 mg by mouth at bedtime.    [provider]  memantine (NAMENDA) 10 MG tablet  08/06/22   [provider]  omeprazole (PRILOSEC) 20 MG capsule Take 2 capsules (40 mg total) by mouth daily. 04/26/22   Vassie Loll, MD  Probiotic Product (PROBIOTIC DAILY PO) Take 1 Capful by mouth daily.    [provider]  rosuvastatin (CRESTOR) 20 MG tablet Take 1 tablet (20 mg total) by mouth daily. 12/27/22 12/27/23  Shon Hale, MD  thiamine (VITAMIN B-1) 100 MG tablet Take 1 tablet (100 mg total) by mouth daily. 04/27/22   Vassie Loll, MD  valsartan (DIOVAN) 40 MG tablet Take 1 tablet (40 mg total) by mouth daily. 04/26/22   Vassie Loll, MD  vitamin B-12 (CYANOCOBALAMIN) 500 MCG tablet Take 500 mcg by mouth daily.    [provider]    Family History Family History  Problem Relation Age of Onset   Dementia Father    Memory loss Father    Breast cancer Neg Hx     Social History Social History   Tobacco Use   Smoking status: Never   Smokeless tobacco: Never  Vaping Use   Vaping status: Never Used  Substance Use Topics   Alcohol use: Never   Drug use: Never     Allergies   Dynacin [minocycline hcl], Gatifloxacin, Lorabid [loracarbef], and Minocycline   Review of Systems Review of Systems Per HPI  Physical Exam Triage Vital Signs ED Triage Vitals  Encounter Vitals Group     BP 08/20/23 1234 (!) 147/69     Systolic BP Percentile --      Diastolic BP Percentile --      Pulse Rate 08/20/23 1234 (!) 56     Resp 08/20/23 1234 16     Temp 08/20/23 1234 (!) 96.8 F (36 C)     Temp Source 08/20/23  1234 Temporal     SpO2 08/20/23 1234 96 %     Weight --      Height --      Head Circumference --      Peak Flow --      Pain Score 08/20/23 1237 0     Pain Loc --      Pain Education --      Exclude from Growth Chart --    No data found.  Updated Vital Signs BP (!) 147/69 (BP Location: Right Arm)  Pulse (!) 56   Temp (!) 96.8 F (36 C) (Temporal)   Resp 16   SpO2 96%   Visual Acuity Right Eye Distance:   Left Eye Distance:   Bilateral Distance:    Right Eye Near:   Left Eye Near:    Bilateral Near:     Physical Exam Vitals and nursing note reviewed.  Constitutional:      Appearance: Normal appearance. She is not ill-appearing.  HENT:     Head: Atraumatic.     Mouth/Throat:     Mouth: Mucous membranes are moist.  Eyes:     Extraocular Movements: Extraocular movements intact.     Conjunctiva/sclera: Conjunctivae normal.  Cardiovascular:     Rate and Rhythm: Normal rate and regular rhythm.     Heart sounds: Normal heart sounds.  Pulmonary:     Effort: Pulmonary effort is normal.     Breath sounds: Normal breath sounds.  Abdominal:     General: Bowel sounds are normal. There is no distension.     Palpations: Abdomen is soft.     Tenderness: There is no abdominal tenderness. There is no right CVA tenderness, left CVA tenderness or guarding.  Musculoskeletal:        General: Normal range of motion.     Cervical back: Normal range of motion and neck supple.  Skin:    General: Skin is warm and dry.     Findings: Rash present.     Comments: Erythema and maceration below pannus diffusely  Neurological:     Mental Status: She is alert. Mental status is at baseline.  Psychiatric:        Behavior: Behavior normal.      UC Treatments / Results  Labs (all labs ordered are listed, but only abnormal results are displayed) Labs Reviewed  POCT URINALYSIS DIP (MANUAL ENTRY) - Abnormal; Notable for the following components:      Result Value   Blood, UA  trace-intact (*)    Leukocytes, UA Small (1+) (*)    All other components within normal limits  URINE CULTURE    EKG   Radiology No results found.  Procedures Procedures (including critical care time)  Medications Ordered in UC Medications - No data to display  Initial Impression / Assessment and Plan / UC Course  I have reviewed the triage vital signs and the nursing notes.  Pertinent labs & imaging results that were available during my care of the patient were reviewed by me and considered in my medical decision making (see chart for details).     Overall well-appearing and in no acute distress.  Urinalysis today with evidence of a possible urinary tract infection.  Treat with Bactrim, await urine culture and adjust if needed.  Good hydration recommended.  Will treat with oral Diflucan in addition to nystatin topically for yeast dermatitis.  Return for worsening symptoms.  Final Clinical Impressions(s) / UC Diagnoses   Final diagnoses:  Acute lower UTI  Yeast dermatitis     Discharge Instructions      I have sent in an antibiotic for a urinary tract infection.  We have sent out for a urine culture to confirm that we are on the right medicine.  Someone will call if we need to make any changes to your medication.  I have also sent in some oral yeast medication that you can use daily in addition to the topical yeast medicine for the skin infection    ED Prescriptions  Medication Sig Dispense Auth. Provider   fluconazole (DIFLUCAN) 150 MG tablet Take 1 tablet (150 mg total) by mouth daily. 5 tablet Particia Nearing, [place]   sulfamethoxazole-trimethoprim (BACTRIM DS) 800-160 MG tablet Take 1 tablet by mouth 2 (two) times daily for 5 days. 10 tablet Particia Nearing, [place]      PDMP not reviewed this encounter.   Particia Nearing, [place] 08/20/23 1312

## 2023-08-20 NOTE — Discharge Instructions (Signed)
 I have sent in an antibiotic for a urinary tract infection.  We have sent out for a urine culture to confirm that we are on the right medicine.  Someone will call if we need to make any changes to your medication.  I have also sent in some oral yeast medication that you can use daily in addition to the topical yeast medicine for the skin infection

## 2023-08-20 NOTE — ED Triage Notes (Signed)
 Per pt husband, she has some frequent urination, "just doesn't feel good", and has low back pain x 8 days?

## 2023-08-22 ENCOUNTER — Other Ambulatory Visit: Payer: Self-pay | Admitting: Family Medicine

## 2023-08-22 DIAGNOSIS — Z1231 Encounter for screening mammogram for malignant neoplasm of breast: Secondary | ICD-10-CM

## 2023-08-24 ENCOUNTER — Telehealth: Payer: Self-pay

## 2023-08-24 ENCOUNTER — Ambulatory Visit
Admission: EM | Admit: 2023-08-24 | Discharge: 2023-08-24 | Disposition: A | Discharge status: Home / Self Care | Payer: Medicare HMO | Attending: Family Medicine | Admitting: Family Medicine

## 2023-08-24 ENCOUNTER — Encounter: Payer: Self-pay | Admitting: Emergency Medicine

## 2023-08-24 DIAGNOSIS — R682 Dry mouth, unspecified: Secondary | ICD-10-CM | POA: Insufficient documentation

## 2023-08-24 DIAGNOSIS — N390 Urinary tract infection, site not specified: Secondary | ICD-10-CM | POA: Insufficient documentation

## 2023-08-24 DIAGNOSIS — R5383 Other fatigue: Secondary | ICD-10-CM | POA: Diagnosis not present

## 2023-08-24 DIAGNOSIS — L570 Actinic keratosis: Secondary | ICD-10-CM | POA: Diagnosis not present

## 2023-08-24 LAB — POCT URINALYSIS DIP (MANUAL ENTRY)
Bilirubin, UA: NEGATIVE
Blood, UA: NEGATIVE
Glucose, UA: NEGATIVE mg/dL
Ketones, POC UA: NEGATIVE mg/dL
Nitrite, UA: NEGATIVE
Protein Ur, POC: NEGATIVE mg/dL
Spec Grav, UA: 1.015 (ref 1.010–1.025)
Urobilinogen, UA: 0.2 U/dL
pH, UA: 7 (ref 5.0–8.0)

## 2023-08-24 LAB — URINE CULTURE: Culture: >=100000 — AB

## 2023-08-24 MED ORDER — CEPHALEXIN 500 MG PO CAPS: 500.0000 mg | ORAL_CAPSULE | Freq: Two times a day (BID) | ORAL | 0 refills | Status: DC

## 2023-08-24 MED ORDER — NITROFURANTOIN MONOHYD MACRO 100 MG PO CAPS: 100.0000 mg | ORAL_CAPSULE | Freq: Two times a day (BID) | ORAL | 0 refills | Status: DC

## 2023-08-24 NOTE — Telephone Encounter (Signed)
 Pharmacy called stating that they have a listed allergy for keflex in they system for pt. Pharmacist requested a change in medicine due to listed allergy provider was informed of the request and sent in a prescription for Macrobid. Pharmacist aware of change.

## 2023-08-24 NOTE — ED Provider Notes (Addendum)
 [facility]    CSN: [id number] Arrival date & time: 08/24/23  1048      History   Chief Complaint No chief complaint on file.   HPI Diana Smith is a 78 y.o. female.   Patient with history of Alzheimer's dementia, recurrent UTIs, major depression, prediabetes, hyperlipidemia, vitamin deficiencies, hypertension, history of TIA presenting today with her husband who provides nearly all the history.  He states he came to visit her today at the assisted living facility and she was very fatigued, laying around on the bed and states she does not feel good.  He states her lips are dry and he was able to get her to drink 2 small bottles of water but that she is not drinking enough fluids at baseline.  Patient denies chest pain, shortness of breath, abdominal pain, fevers, nausea vomiting, diaphoresis, dizziness, headache but does note some ongoing urinary frequency and urgency.  She was seen 4 days ago, diagnosed with a UTI and treated with Bactrim which she completed.  Urine culture shows that should be susceptible.    Past Medical History:  Diagnosis Date   Abdominal pain 04/24/2022   Actinic keratosis    Allergic rhinitis due to pollen    Cerebrovascular disease    Dementia due to Alzheimer's disease 09/10/2022   Essential hypertension 04/24/2022   Frequent urinary tract infections    Gastro-esophageal reflux disease without esophagitis    Generalized anxiety disorder    Heartburn    Hematochezia    History of COVID-19    Hyperglycemia    Hyperlipidemia    Hypoalbuminemia due to protein-calorie malnutrition 04/24/2022   Insomnia 04/24/2022   Major depressive disorder    Nonalcoholic steatohepatitis (NASH)    Osteoarthritis of left knee 03/21/2022   Osteoarthritis of right knee 01/25/2021   Overactive bladder    Personal history of colonic polyps    Prediabetes    Pulmonary nodule 04/24/2022   Pure hypercholesterolemia    Right bundle branch block    Rosacea     Sciatica, right side    Slow transit constipation    Starvation ketoacidosis 04/24/2022   UTI (urinary tract infection) 04/24/2022   Vitamin B12 deficiency (non anemic)    Vitamin D deficiency     Patient Active Problem List   Diagnosis Date Noted   TIA (transient ischemic attack) 12/26/2022   Dementia due to Alzheimer's disease 09/10/2022   Abdominal pain 04/24/2022   Hypoalbuminemia due to protein-calorie malnutrition 04/24/2022   Pulmonary nodule 04/24/2022   Essential hypertension 04/24/2022   Insomnia 04/24/2022   Actinic keratosis    Allergic rhinitis due to pollen    Cerebrovascular disease    History of COVID-19    Gastro-esophageal reflux disease without esophagitis    Generalized anxiety disorder    Hyperglycemia    Hyperlipidemia    Major depressive disorder    Nonalcoholic steatohepatitis (NASH)    Overactive bladder    History of colonic polyps    Prediabetes    Right bundle branch block    Rosacea    Sciatica, right side    Slow transit constipation    Vitamin B12 deficiency (non anemic)    Vitamin D deficiency    Osteoarthritis of right knee 01/25/2021    Past Surgical History:  Procedure Laterality Date   HYSTEROTOMY     MASS EXCISION  10/08/2011   Procedure: MINOR EXCISION OF MASS;  Surgeon: Wyn Forster., MD;  Location: [facility];  Service: Orthopedics;  Laterality: Left;  excisional biopsy dorsum left long finger MCP joint    OB History   No obstetric history on file.      Home Medications    Prior to Admission medications   Medication Sig Start Date End Date Taking? Authorizing Provider  cephALEXin (KEFLEX) 500 MG capsule Take 1 capsule (500 mg total) by mouth 2 (two) times daily. 08/24/23  Yes Particia Nearing, PA-C  ALPRAZolam Prudy Feeler) 0.25 MG tablet Take 0.125-0.25 mg by mouth See admin instructions. Take 1/2 tablet by mouth daily if needed take 1 tablet as needed for anxiety 12/23/20   [provider]  aspirin EC 81 MG tablet Take 1 tablet (81 mg total) by mouth daily. Swallow whole. 12/28/22   Shon Hale, MD  Biotin 1000 MCG tablet Take 1,000 mcg by mouth daily.    [provider]  calcium carbonate (OSCAL) 1500 (600 Ca) MG TABS tablet Take 1,500 mg by mouth daily with breakfast.    [provider]  escitalopram (LEXAPRO) 5 MG tablet Take 5 mg by mouth daily. 04/12/22   [provider]  fluconazole (DIFLUCAN) 150 MG tablet Take 1 tablet (150 mg total) by mouth daily. 08/20/23   Particia Nearing, PA-C  Melatonin 10 MG CAPS Take 10 mg by mouth at bedtime.    [provider]  memantine (NAMENDA) 10 MG tablet  08/06/22   [provider]  omeprazole (PRILOSEC) 20 MG capsule Take 2 capsules (40 mg total) by mouth daily. 04/26/22   Vassie Loll, MD  Probiotic Product (PROBIOTIC DAILY PO) Take 1 Capful by mouth daily.    [provider]  rosuvastatin (CRESTOR) 20 MG tablet Take 1 tablet (20 mg total) by mouth daily. 12/27/22 12/27/23  Shon Hale, MD  thiamine (VITAMIN B-1) 100 MG tablet Take 1 tablet (100 mg total) by mouth daily. 04/27/22   Vassie Loll, MD  valsartan (DIOVAN) 40 MG tablet Take 1 tablet (40 mg total) by mouth daily. 04/26/22   Vassie Loll, MD  vitamin B-12 (CYANOCOBALAMIN) 500 MCG tablet Take 500 mcg by mouth daily.    [provider]    Family History Family History  Problem Relation Age of Onset   Dementia Father    Memory loss Father    Breast cancer Neg Hx     Social History Social History   Tobacco Use   Smoking status: Never   Smokeless tobacco: Never  Vaping Use   Vaping status: Never Used  Substance Use Topics   Alcohol use: Never   Drug use: Never     Allergies   Dynacin [minocycline hcl], Gatifloxacin, Lorabid [loracarbef], and Minocycline   Review of Systems Review of Systems Per HPI  Physical Exam Triage Vital Signs ED Triage Vitals  Encounter Vitals Group      BP 08/24/23 1114 126/77     Systolic BP Percentile --      Diastolic BP Percentile --      Pulse Rate 08/24/23 1114 (!) 58     Resp 08/24/23 1114 18     Temp 08/24/23 1114 97.9 F (36.6 C)     Temp Source 08/24/23 1114 Oral     SpO2 08/24/23 1114 92 %     Weight --      Height --      Head Circumference --      Peak Flow --      Pain Score 08/24/23 1116 0  Pain Loc --      Pain Education --      Exclude from Growth Chart --    No data found.  Updated Vital Signs BP 126/77 (BP Location: Right Arm)   Pulse (!) 58   Temp 97.9 F (36.6 C) (Oral)   Resp 18   SpO2 92%   Visual Acuity Right Eye Distance:   Left Eye Distance:   Bilateral Distance:    Right Eye Near:   Left Eye Near:    Bilateral Near:     Physical Exam Vitals and nursing note reviewed.  Constitutional:      Appearance: She is not ill-appearing.  HENT:     Head: Atraumatic.  Eyes:     Extraocular Movements: Extraocular movements intact.     Conjunctiva/sclera: Conjunctivae normal.  Cardiovascular:     Rate and Rhythm: Normal rate and regular rhythm.  Pulmonary:     Effort: Pulmonary effort is normal.     Breath sounds: Normal breath sounds.  Abdominal:     General: Bowel sounds are normal. There is no distension.     Palpations: Abdomen is soft.     Tenderness: There is no abdominal tenderness. There is no right CVA tenderness, left CVA tenderness or guarding.  Musculoskeletal:        General: Normal range of motion.     Cervical back: Normal range of motion and neck supple.  Skin:    General: Skin is warm and dry.     Comments: Small AK present to right dorsal wrist  Neurological:     Mental Status: She is alert. Mental status is at baseline.  Psychiatric:        Mood and Affect: Mood normal.        Thought Content: Thought content normal.        Judgment: Judgment normal.      UC Treatments / Results  Labs (all labs ordered are listed, but only abnormal results are displayed) Labs  Reviewed  POCT URINALYSIS DIP (MANUAL ENTRY) - Abnormal; Notable for the following components:      Result Value   Leukocytes, UA Small (1+) (*)    All other components within normal limits  URINE CULTURE  COMPREHENSIVE METABOLIC PANEL  CBC WITH DIFFERENTIAL/PLATELET    EKG   Radiology No results found.  Procedures Procedures (including critical care time)  Medications Ordered in UC Medications - No data to display  Initial Impression / Assessment and Plan / UC Course  I have reviewed the triage vital signs and the nursing notes.  Pertinent labs & imaging results that were available during my care of the patient were reviewed by me and considered in my medical decision making (see chart for details).     Urine culture from recent visit reviewed and shows the Bactrim should have been sufficient for her urinary tract infection, however still having urinary symptoms and 1+ leuks on urinalysis so we will start Keflex and discussed importance of increasing fluids.  Unclear if this is what is causing her overall fatigue, so we will also obtain basic labs for further evaluation and safety check.  COVID testing per husband was done at the assisted living facility this morning and was negative and they declined further viral testing.  Discussed importance of oral hydration, her lips are dry and she does appear a bit dehydrated today.  He did also ask me about a white raised area on her right hand, appears to be an actinic  keratosis.  Discussed following up with PCP or dermatology for cryotherapy Final Clinical Impressions(s) / UC Diagnoses   Final diagnoses:  Acute lower UTI  Other fatigue  Dry mouth  Actinic keratosis     Discharge Instructions      I have sent out another urine culture as you still have some bacteria in your urine.  I have sent a new antibiotic for you to try and want you to take in more fluids than what you are currently drinking.  We will be in touch tomorrow if  your lab results come back abnormal.  Follow-up as soon as possible with your primary care provider    ED Prescriptions     Medication Sig Dispense Auth. Provider   cephALEXin (KEFLEX) 500 MG capsule Take 1 capsule (500 mg total) by mouth 2 (two) times daily. 10 capsule Particia Nearing, [place]      PDMP not reviewed this encounter.   Particia Nearing, [place] 08/24/23 1152    Roosvelt Maser [place], [place] 08/24/23 1153

## 2023-08-24 NOTE — Discharge Instructions (Signed)
 I have sent out another urine culture as you still have some bacteria in your urine.  I have sent a new antibiotic for you to try and want you to take in more fluids than what you are currently drinking.  We will be in touch tomorrow if your lab results come back abnormal.  Follow-up as soon as possible with your primary care provider

## 2023-08-24 NOTE — ED Triage Notes (Signed)
 Was seen on Saturday for UTI symptoms.  States she doesn't feel good.  Covid test at facility was negative.  States she does have urinary urgency.

## 2023-08-25 LAB — CBC WITH DIFFERENTIAL/PLATELET
Basophils Absolute: 0 10*3/uL (ref 0.0–0.2)
Basos: 0 %
EOS (ABSOLUTE): 0.1 10*3/uL (ref 0.0–0.4)
Eos: 1 %
Hematocrit: 34.7 % (ref 34.0–46.6)
Hemoglobin: 11.5 g/dL (ref 11.1–15.9)
Immature Grans (Abs): 0 10*3/uL (ref 0.0–0.1)
Immature Granulocytes: 0 %
Lymphocytes Absolute: 2.7 10*3/uL (ref 0.7–3.1)
Lymphs: 40 %
MCH: 31.7 pg (ref 26.6–33.0)
MCHC: 33.1 g/dL (ref 31.5–35.7)
MCV: 96 fL (ref 79–97)
Monocytes Absolute: 0.4 10*3/uL (ref 0.1–0.9)
Monocytes: 6 %
Neutrophils Absolute: 3.5 10*3/uL (ref 1.4–7.0)
Neutrophils: 53 %
Platelets: 233 10*3/uL (ref 150–450)
RBC: 3.63 x10E6/uL — ABNORMAL LOW (ref 3.77–5.28)
RDW: 13.9 % (ref 11.7–15.4)
WBC: 6.6 10*3/uL (ref 3.4–10.8)

## 2023-08-25 LAB — COMPREHENSIVE METABOLIC PANEL
ALT: 14 IU/L (ref 0–32)
AST: 21 IU/L (ref 0–40)
Albumin: 4 g/dL (ref 3.8–4.8)
Alkaline Phosphatase: 102 IU/L (ref 44–121)
BUN/Creatinine Ratio: 10 — ABNORMAL LOW (ref 12–28)
BUN: 10 mg/dL (ref 8–27)
Bilirubin Total: <0.2 mg/dL (ref 0.0–1.2)
CO2: 21 mmol/L (ref 20–29)
Calcium: 9.3 mg/dL (ref 8.7–10.3)
Chloride: 107 mmol/L — ABNORMAL HIGH (ref 96–106)
Creatinine, Ser: 1.03 mg/dL — ABNORMAL HIGH (ref 0.57–1.00)
Globulin, Total: 2.3 g/dL (ref 1.5–4.5)
Glucose: 75 mg/dL (ref 70–99)
Potassium: 4.5 mmol/L (ref 3.5–5.2)
Sodium: 141 mmol/L (ref 134–144)
Total Protein: 6.3 g/dL (ref 6.0–8.5)
eGFR: 56 mL/min/{1.73_m2} — ABNORMAL LOW (ref >59)

## 2023-08-25 LAB — URINE CULTURE: Culture: NO GROWTH

## 2023-08-30 DIAGNOSIS — I10 Essential (primary) hypertension: Secondary | ICD-10-CM | POA: Diagnosis not present

## 2023-08-30 DIAGNOSIS — E559 Vitamin D deficiency, unspecified: Secondary | ICD-10-CM | POA: Diagnosis not present

## 2023-08-30 DIAGNOSIS — E782 Mixed hyperlipidemia: Secondary | ICD-10-CM | POA: Diagnosis not present

## 2023-08-30 DIAGNOSIS — E039 Hypothyroidism, unspecified: Secondary | ICD-10-CM | POA: Diagnosis not present

## 2023-09-07 ENCOUNTER — Encounter (HOSPITAL_COMMUNITY): Payer: Self-pay

## 2023-09-07 ENCOUNTER — Emergency Department (HOSPITAL_COMMUNITY)
Admission: EM | Admit: 2023-09-07 | Discharge: 2023-09-07 | Disposition: A | Discharge status: Home / Self Care | Attending: Emergency Medicine | Admitting: Emergency Medicine

## 2023-09-07 ENCOUNTER — Other Ambulatory Visit: Payer: Self-pay

## 2023-09-07 DIAGNOSIS — Z7982 Long term (current) use of aspirin: Secondary | ICD-10-CM | POA: Diagnosis not present

## 2023-09-07 DIAGNOSIS — F0390 Unspecified dementia without behavioral disturbance: Secondary | ICD-10-CM | POA: Insufficient documentation

## 2023-09-07 DIAGNOSIS — Z8616 Personal history of COVID-19: Secondary | ICD-10-CM | POA: Diagnosis not present

## 2023-09-07 DIAGNOSIS — Z79899 Other long term (current) drug therapy: Secondary | ICD-10-CM | POA: Diagnosis not present

## 2023-09-07 DIAGNOSIS — I10 Essential (primary) hypertension: Secondary | ICD-10-CM | POA: Diagnosis not present

## 2023-09-07 DIAGNOSIS — R456 Violent behavior: Secondary | ICD-10-CM | POA: Diagnosis present

## 2023-09-07 LAB — BASIC METABOLIC PANEL
Anion gap: 9 (ref 5–15)
BUN: 10 mg/dL (ref 8–23)
CO2: 23 mmol/L (ref 22–32)
Calcium: 9.1 mg/dL (ref 8.9–10.3)
Chloride: 108 mmol/L (ref 98–111)
Creatinine, Ser: 0.72 mg/dL (ref 0.44–1.00)
GFR, Estimated: >60 mL/min (ref >60)
Glucose, Bld: 127 mg/dL — ABNORMAL HIGH (ref 70–99)
Potassium: 3.5 mmol/L (ref 3.5–5.1)
Sodium: 140 mmol/L (ref 135–145)

## 2023-09-07 LAB — URINALYSIS, ROUTINE W REFLEX MICROSCOPIC
Bilirubin Urine: NEGATIVE
Glucose, UA: NEGATIVE mg/dL
Hgb urine dipstick: NEGATIVE
Ketones, ur: 5 mg/dL — AB
Nitrite: NEGATIVE
Protein, ur: NEGATIVE mg/dL
Specific Gravity, Urine: 1.016 (ref 1.005–1.030)
Trans Epithel, UA: 3
WBC, UA: >50 WBC/hpf (ref 0–5)
pH: 5 (ref 5.0–8.0)

## 2023-09-07 LAB — CBC WITH DIFFERENTIAL/PLATELET
Abs Immature Granulocytes: 0.02 10*3/uL (ref 0.00–0.07)
Basophils Absolute: 0 10*3/uL (ref 0.0–0.1)
Basophils Relative: 0 %
Eosinophils Absolute: 0 10*3/uL (ref 0.0–0.5)
Eosinophils Relative: 0 %
HCT: 35.1 % — ABNORMAL LOW (ref 36.0–46.0)
Hemoglobin: 11.5 g/dL — ABNORMAL LOW (ref 12.0–15.0)
Immature Granulocytes: 0 %
Lymphocytes Relative: 25 %
Lymphs Abs: 2 10*3/uL (ref 0.7–4.0)
MCH: 31.3 pg (ref 26.0–34.0)
MCHC: 32.8 g/dL (ref 30.0–36.0)
MCV: 95.6 fL (ref 80.0–100.0)
Monocytes Absolute: 0.7 10*3/uL (ref 0.1–1.0)
Monocytes Relative: 8 %
Neutro Abs: 5.3 10*3/uL (ref 1.7–7.7)
Neutrophils Relative %: 67 %
Platelets: 204 10*3/uL (ref 150–400)
RBC: 3.67 MIL/uL — ABNORMAL LOW (ref 3.87–5.11)
RDW: 14 % (ref 11.5–15.5)
WBC: 8 10*3/uL (ref 4.0–10.5)
nRBC: 0 % (ref 0.0–0.2)

## 2023-09-07 LAB — RESP PANEL BY RT-PCR (RSV, FLU A&B, COVID)  RVPGX2
Influenza A by PCR: NEGATIVE
Influenza B by PCR: NEGATIVE
Resp Syncytial Virus by PCR: NEGATIVE
SARS Coronavirus 2 by RT PCR: NEGATIVE

## 2023-09-07 LAB — CBG MONITORING, ED: Glucose-Capillary: 114 mg/dL — ABNORMAL HIGH (ref 70–99)

## 2023-09-07 MED ORDER — ACETAMINOPHEN 325 MG PO TABS: 650.0000 mg | ORAL_TABLET | ORAL | Status: DC | PRN

## 2023-09-07 MED ORDER — ESCITALOPRAM OXALATE 10 MG PO TABS: 5.0000 mg | ORAL_TABLET | Freq: Every day | ORAL | Status: DC

## 2023-09-07 MED ORDER — MELATONIN 3 MG PO TABS
9.0000 mg | ORAL_TABLET | Freq: Every day | ORAL | Status: DC
  Administered 2023-09-07: 9 mg via ORAL
  Filled 2023-09-07: qty 3

## 2023-09-07 MED ORDER — FOSFOMYCIN TROMETHAMINE 3 G PO PACK
3.0000 g | PACK | Freq: Once | ORAL | Status: AC
  Administered 2023-09-07: 3 g via ORAL
  Filled 2023-09-07: qty 3

## 2023-09-07 MED ORDER — ESCITALOPRAM OXALATE 10 MG PO TABS
10.0000 mg | ORAL_TABLET | Freq: Every day | ORAL | Status: DC
  Administered 2023-09-07: 10 mg via ORAL
  Filled 2023-09-07: qty 1

## 2023-09-07 MED ORDER — ROSUVASTATIN CALCIUM 20 MG PO TABS
20.0000 mg | ORAL_TABLET | Freq: Every day | ORAL | Status: DC
Start: 2023-09-07 — End: 2023-09-07
  Administered 2023-09-07: 20 mg via ORAL
  Filled 2023-09-07: qty 1

## 2023-09-07 MED ORDER — ONDANSETRON 8 MG PO TBDP
8.0000 mg | ORAL_TABLET | Freq: Once | ORAL | Status: AC
  Administered 2023-09-07: 8 mg via ORAL
  Filled 2023-09-07: qty 1

## 2023-09-07 MED ORDER — QUETIAPINE FUMARATE 50 MG PO TABS: 50.0000 mg | ORAL_TABLET | Freq: Every day | ORAL | 3 refills | Status: DC

## 2023-09-07 MED ORDER — ALPRAZOLAM 0.5 MG PO TABS: 0.2500 mg | ORAL_TABLET | Freq: Every day | ORAL | Status: DC | PRN

## 2023-09-07 MED ORDER — QUETIAPINE FUMARATE 50 MG PO TABS: 50.0000 mg | ORAL_TABLET | Freq: Two times a day (BID) | ORAL | 3 refills | Status: DC | PRN

## 2023-09-07 MED ORDER — MEMANTINE HCL 10 MG PO TABS
10.0000 mg | ORAL_TABLET | Freq: Every day | ORAL | Status: DC
Start: 2023-09-07 — End: 2023-09-07
  Administered 2023-09-07: 10 mg via ORAL
  Filled 2023-09-07: qty 1

## 2023-09-07 MED ORDER — ASPIRIN 81 MG PO TBEC
81.0000 mg | DELAYED_RELEASE_TABLET | Freq: Every day | ORAL | Status: DC
Start: 2023-09-07 — End: 2023-09-07
  Administered 2023-09-07: 81 mg via ORAL
  Filled 2023-09-07: qty 1
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
## 2023-09-07 NOTE — ED Notes (Signed)
 Pt able to take PO medications with encouragement. Pt required applesauce to be able to swallow medications this morning.

## 2023-09-07 NOTE — ED Notes (Signed)
 ED Provider at bedside.

## 2023-09-07 NOTE — ED Notes (Addendum)
 CSW called DAWN since MD shared that [facility] will accept pt back. Dawn is on the way to come lay eyes on patient and advised for husband to transport pt back since they do not have a driver. FL2 was completed, needs MD signature to fax to [facility].  FL2 was faxed. TOC signing off.
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
## 2023-09-07 NOTE — [place] FL2 (Addendum)
 [place] MEDICAID FL2 LEVEL OF CARE FORM     IDENTIFICATION  Patient Name: Diana Smith Birthdate: May 20, 1946 Sex: female Admission Date (Current Location): 09/07/2023  [facility] and [place] Number:  [employer] and Address:  [facility],  [address] [place] [address]      Provider Number: [id number]  Attending Physician Name and Address:  Glendora Score MD  Relative Name and Phone Number:  Kamry Faraci [phone]    Current Level of Care: Hospital Recommended Level of Care: Memory Care Prior Approval Number:    Date Approved/Denied:   PASRR Number:    Discharge Plan: Other (Comment) (Memory care)    Current Diagnoses: Patient Active Problem List   Diagnosis Date Noted   TIA (transient ischemic attack) 12/26/2022   Dementia due to Alzheimer's disease 09/10/2022   Abdominal pain 04/24/2022   Hypoalbuminemia due to protein-calorie malnutrition 04/24/2022   Pulmonary nodule 04/24/2022   Essential hypertension 04/24/2022   Insomnia 04/24/2022   Actinic keratosis    Allergic rhinitis due to pollen    Cerebrovascular disease    History of COVID-19    Gastro-esophageal reflux disease without esophagitis    Generalized anxiety disorder    Hyperglycemia    Hyperlipidemia    Major depressive disorder    Nonalcoholic steatohepatitis (NASH)    Overactive bladder    History of colonic polyps    Prediabetes    Right bundle branch block    Rosacea    Sciatica, right side    Slow transit constipation    Vitamin B12 deficiency (non anemic)    Vitamin D deficiency    Osteoarthritis of right knee 01/25/2021    Orientation RESPIRATION BLADDER Height & Weight     Self  Normal Incontinent Weight: 167 lb 15.9 oz (76.2 kg) Height:  5\' 5"  (165.1 cm)  BEHAVIORAL SYMPTOMS/MOOD NEUROLOGICAL BOWEL NUTRITION STATUS      Continent Diet (regular)  AMBULATORY STATUS COMMUNICATION OF NEEDS Skin   Independent Verbally Normal                        Personal Care Assistance Level of Assistance  Bathing, Feeding, Dressing Bathing Assistance: Limited assistance Feeding assistance: Independent Dressing Assistance: Limited assistance     Functional Limitations Info  Sight, Hearing, Speech Sight Info: Impaired Hearing Info: Adequate Speech Info: Adequate    SPECIAL CARE FACTORS FREQUENCY                       Contractures Contractures Info: Not present    Additional Factors Info  Code Status, Allergies Code Status Info: FULL Allergies Info: Dynancin (minocycline Hcl), Gatifloxacin, Keflex (cephalexin), Lorabid (loracarbef), and Minocycline           Current Medications (09/07/2023):  This is the current hospital active medication list Current Facility-Administered Medications  Medication Dose Route Frequency Provider Last Rate Last Admin   acetaminophen (TYLENOL) tablet 650 mg  650 mg Oral Q4H PRN Zadie Rhine, MD       ALPRAZolam Prudy Feeler) tablet 0.25 mg  0.25 mg Oral Daily PRN Zadie Rhine, MD       aspirin EC tablet 81 mg  81 mg Oral Daily Zadie Rhine, MD   81 mg at 09/07/23 1006   escitalopram (LEXAPRO) tablet 10 mg  10 mg Oral Daily Kommor, [place], MD   10 mg at 09/07/23 1006   melatonin tablet 9 mg  9 mg Oral QHS  Zadie Rhine, MD   9 mg at 09/07/23 0450   memantine (NAMENDA) tablet 10 mg  10 mg Oral Daily Zadie Rhine, MD   10 mg at 09/07/23 1006   rosuvastatin (CRESTOR) tablet 20 mg  20 mg Oral Daily Zadie Rhine, MD   20 mg at 09/07/23 1006   Current Outpatient Medications  Medication Sig Dispense Refill   ALPRAZolam (XANAX) 0.25 MG tablet Take 0.125-0.25 mg by mouth See admin instructions. Take 1/2 tablet by mouth daily if needed take 1 tablet as needed for anxiety     aspirin EC 81 MG tablet Take 1 tablet (81 mg total) by mouth daily. Swallow whole. 30 tablet 11   calcium carbonate (OSCAL) 1500 (600 Ca) MG TABS tablet Take 600 mg of elemental calcium by mouth in the morning  and at bedtime.     escitalopram (LEXAPRO) 10 MG tablet Take 10 mg by mouth daily.     Melatonin 10 MG CAPS Take 10 mg by mouth at bedtime.     memantine (NAMENDA) 10 MG tablet      Multiple Vitamins-Minerals (HAIR SKIN & NAILS PO) Take 1 tablet by mouth daily.     omeprazole (PRILOSEC) 20 MG capsule Take 2 capsules (40 mg total) by mouth daily. 60 capsule 2   Probiotic Product (PROBIOTIC DAILY PO) Take 1 Capful by mouth daily.     QUEtiapine (SEROQUEL) 50 MG tablet Take 1 tablet (50 mg total) by mouth at bedtime. 30 tablet 3   QUEtiapine (SEROQUEL) 50 MG tablet Take 1 tablet (50 mg total) by mouth 2 (two) times daily as needed (for agitation). 30 tablet 3   rosuvastatin (CRESTOR) 20 MG tablet Take 1 tablet (20 mg total) by mouth daily. 30 tablet 11   thiamine (VITAMIN B-1) 100 MG tablet Take 1 tablet (100 mg total) by mouth daily. 30 tablet 1   valsartan (DIOVAN) 40 MG tablet Take 1 tablet (40 mg total) by mouth daily. 30 tablet 1     Discharge Medications: Please review AVS      Relevant Imaging Results:  Relevant Lab Results:   Additional Information SSN: [id number]  Isabella Bowens, LCSWA

## 2023-09-07 NOTE — ED Notes (Signed)
 Attempted to contact granddaughter at this time to pick up pt's spouse. Secure vm left.

## 2023-09-07 NOTE — ED Provider Notes (Signed)
 [place] EMERGENCY DEPARTMENT AT [facility] Provider Note   CSN: [id number] Arrival date & time: 09/07/23  0023     History  Chief Complaint  Patient presents with   Aggressive Behavior   Level 5 caveat due to dementia Diana Smith is a 78 y.o. female.  The history is provided by the patient and the spouse.  Patient with history of dementia presents from nursing facility.  She is accompanied by her husband It is reported that patient stays at [facility].  Apparently tonight patient became aggressive towards staff and was told to leave the facility.  Husband was called and told to bring her to the ER and that she could not return to the facility. Patient is otherwise at her baseline. She has no pain complaints   Past Medical History:  Diagnosis Date   Abdominal pain 04/24/2022   Actinic keratosis    Allergic rhinitis due to pollen    Cerebrovascular disease    Dementia due to Alzheimer's disease 09/10/2022   Essential hypertension 04/24/2022   Frequent urinary tract infections    Gastro-esophageal reflux disease without esophagitis    Generalized anxiety disorder    Heartburn    Hematochezia    History of COVID-19    Hyperglycemia    Hyperlipidemia    Hypoalbuminemia due to protein-calorie malnutrition 04/24/2022   Insomnia 04/24/2022   Major depressive disorder    Nonalcoholic steatohepatitis (NASH)    Osteoarthritis of left knee 03/21/2022   Osteoarthritis of right knee 01/25/2021   Overactive bladder    Personal history of colonic polyps    Prediabetes    Pulmonary nodule 04/24/2022   Pure hypercholesterolemia    Right bundle branch block    Rosacea    Sciatica, right side    Slow transit constipation    Starvation ketoacidosis 04/24/2022   UTI (urinary tract infection) 04/24/2022   Vitamin B12 deficiency (non anemic)    Vitamin D deficiency     Home Medications Prior to Admission medications   Medication Sig Start Date End Date Taking?  Authorizing Provider  ALPRAZolam (XANAX) 0.25 MG tablet Take 0.125-0.25 mg by mouth See admin instructions. Take 1/2 tablet by mouth daily if needed take 1 tablet as needed for anxiety 12/23/20   [provider]  aspirin EC 81 MG tablet Take 1 tablet (81 mg total) by mouth daily. Swallow whole. 12/28/22   Shon Hale, MD  Biotin 1000 MCG tablet Take 1,000 mcg by mouth daily.    [provider]  calcium carbonate (OSCAL) 1500 (600 Ca) MG TABS tablet Take 1,500 mg by mouth daily with breakfast.    [provider]  escitalopram (LEXAPRO) 5 MG tablet Take 5 mg by mouth daily. 04/12/22   [provider]  Melatonin 10 MG CAPS Take 10 mg by mouth at bedtime.    [provider]  memantine (NAMENDA) 10 MG tablet  08/06/22   [provider]  omeprazole (PRILOSEC) 20 MG capsule Take 2 capsules (40 mg total) by mouth daily. 04/26/22   Vassie Loll, MD  Probiotic Product (PROBIOTIC DAILY PO) Take 1 Capful by mouth daily.    [provider]  rosuvastatin (CRESTOR) 20 MG tablet Take 1 tablet (20 mg total) by mouth daily. 12/27/22 12/27/23  Shon Hale, MD  thiamine (VITAMIN B-1) 100 MG tablet Take 1 tablet (100 mg total) by mouth daily. 04/27/22   Vassie Loll, MD  valsartan (DIOVAN) 40 MG tablet Take 1 tablet (40 mg total)  by mouth daily. 04/26/22   Vassie Loll, MD  vitamin B-12 (CYANOCOBALAMIN) 500 MCG tablet Take 500 mcg by mouth daily.    [provider]      Allergies    Dynacin [minocycline hcl], Gatifloxacin, Keflex [cephalexin], Lorabid [loracarbef], and Minocycline    Review of Systems   Review of Systems  Unable to perform ROS: Dementia    Physical Exam Updated Vital Signs BP (!) 118/54   Pulse (!) 59   Temp 98.2 F (36.8 C)   Resp 19   Ht 1.651 m (5\' 5" )   Wt 76.2 kg   SpO2 94%   BMI 27.96 kg/m  Physical Exam CONSTITUTIONAL: Elderly, no acute distress HEAD: Normocephalic/atraumatic, no visible  trauma SPINE/BACK:entire spine nontender CV: S1/S2 noted, no murmurs/rubs/gallops noted LUNGS: Lungs are clear to auscultation bilaterally ABDOMEN: soft, nontender NEURO: Pt is awake/alert, moves all extremitiesx4.  No facial droop.  She is pleasantly confused EXTREMITIES: No deformities SKIN: warm, color normal  ED Results / Procedures / Treatments   Labs (all labs ordered are listed, but only abnormal results are displayed) Labs Reviewed  CBC WITH DIFFERENTIAL/PLATELET - Abnormal; Notable for the following components:      Result Value   RBC 3.67 (*)    Hemoglobin 11.5 (*)    HCT 35.1 (*)    All other components within normal limits  BASIC METABOLIC PANEL - Abnormal; Notable for the following components:   Glucose, Bld 127 (*)    All other components within normal limits  URINALYSIS, ROUTINE W REFLEX MICROSCOPIC - Abnormal; Notable for the following components:   APPearance HAZY (*)    Ketones, ur 5 (*)    Leukocytes,Ua LARGE (*)    Bacteria, UA RARE (*)    All other components within normal limits    EKG None  Radiology No results found.  Procedures Procedures    Medications Ordered in ED Medications  fosfomycin (MONUROL) packet 3 g (has no administration in time range)  acetaminophen (TYLENOL) tablet 650 mg (has no administration in time range)  ALPRAZolam (XANAX) tablet 0.125-0.25 mg (has no administration in time range)  aspirin EC tablet 81 mg (has no administration in time range)  escitalopram (LEXAPRO) tablet 5 mg (has no administration in time range)  Melatonin CAPS 10 mg (has no administration in time range)  memantine (NAMENDA) tablet 10 mg (has no administration in time range)  rosuvastatin (CRESTOR) tablet 20 mg (has no administration in time range)    ED Course/ Medical Decision Making/ A&P                                 Medical Decision Making Amount and/or Complexity of Data Reviewed Labs: ordered.  Risk OTC drugs. Prescription drug  management.   Overall labs are reassuring except for potential UTI. Patient is have recurrent UTIs in the past.  She is not septic appearing, not vomiting and no fever Will give one-time dose of fosfomycin  Home meds have been ordered  Patient will need to have evaluation by transitions of care as they will need to help determine her disposition. Overall patient appears to be at her baseline        Final Clinical Impression(s) / ED Diagnoses Final diagnoses:  Dementia, unspecified dementia severity, unspecified dementia type, unspecified whether behavioral, psychotic, or mood disturbance or anxiety (HCC)    Rx / DC Orders ED Discharge Orders  None         Zadie Rhine, MD 09/07/23 [phone]

## 2023-09-07 NOTE — ED Provider Notes (Signed)
  Physical Exam  BP (!) 144/60 (BP Location: Left Arm)   Pulse (!) 55   Temp 98.2 F (36.8 C)   Resp 20   Ht 5\' 5"  (1.651 m)   Wt 76.2 kg   SpO2 97%   BMI 27.96 kg/m   Physical Exam Vitals and nursing note reviewed.  Constitutional:      General: She is not in acute distress.    Appearance: She is well-developed.  HENT:     Head: Normocephalic and atraumatic.  Eyes:     Conjunctiva/sclera: Conjunctivae normal.  Pulmonary:     Effort: Pulmonary effort is normal. No respiratory distress.  Musculoskeletal:        General: No swelling.     Cervical back: Neck supple.  Skin:    General: Skin is warm and dry.     Capillary Refill: Capillary refill takes less than 2 seconds.  Neurological:     Mental Status: She is alert.  Psychiatric:        Mood and Affect: Mood normal.     Procedures  Procedures  ED Course / MDM    Medical Decision Making Amount and/or Complexity of Data Reviewed Labs: ordered.  Risk OTC drugs. Prescription drug management.   Patient received in handoff.  Episode of agitation last night after her husband took her out of the facility.  Currently in TOC status.  I received a notification that the executive director of the facility wanted a emergent psychiatric consult due to her agitated behavior last night.  Patient has been calm for the 10 hours she has been in the emergency department and has not required any additional medication for chemical restraint.  She is displaying her baseline dementia but does not meet criteria for emergent psychiatric consult in the ER today.  I spoke with patient's nurse at the facility [facility] and informed her I would be happy to write prescriptions for as needed medication for agitation but she will need to return to her facility today as she is medically clear.       Glendora Score, MD 09/07/23 1032

## 2023-09-07 NOTE — Discharge Instructions (Signed)
 This patient was seen in the emergency room for evaluation of agitation in her nursing facility.  Reviewing her medications it appears that she is only on 0.25 mg of Xanax for anxiety.  I will provide prescriptions for nighttime Seroquel use as well as an additional prescription for an additional 50 mg to be used if she is experiencing breakthrough agitation.  At this time she does not meet criteria for emergency psychiatric evaluation as she has been calm and not combative or psychotic while here in the emergency department.  She is safe to return to her facility.

## 2023-09-07 NOTE — ED Notes (Signed)
 Per [facility] SW present in the ED, Pt cleared for discharge back to the facility. Per SW, Pts husband is going to transport the Pt at discharge.

## 2023-09-08 DIAGNOSIS — E871 Hypo-osmolality and hyponatremia: Secondary | ICD-10-CM | POA: Diagnosis not present

## 2023-09-08 DIAGNOSIS — F0390 Unspecified dementia without behavioral disturbance: Secondary | ICD-10-CM | POA: Diagnosis not present

## 2023-09-08 DIAGNOSIS — J984 Other disorders of lung: Secondary | ICD-10-CM | POA: Diagnosis not present

## 2023-09-08 DIAGNOSIS — F03911 Unspecified dementia, unspecified severity, with agitation: Secondary | ICD-10-CM | POA: Diagnosis not present

## 2023-09-08 DIAGNOSIS — Z79899 Other long term (current) drug therapy: Secondary | ICD-10-CM | POA: Diagnosis not present

## 2023-09-08 DIAGNOSIS — R4182 Altered mental status, unspecified: Secondary | ICD-10-CM | POA: Diagnosis not present

## 2023-09-08 DIAGNOSIS — R456 Violent behavior: Secondary | ICD-10-CM | POA: Diagnosis not present

## 2023-09-08 DIAGNOSIS — N390 Urinary tract infection, site not specified: Secondary | ICD-10-CM | POA: Diagnosis not present

## 2023-09-08 DIAGNOSIS — Z87440 Personal history of urinary (tract) infections: Secondary | ICD-10-CM | POA: Diagnosis not present

## 2023-09-12 DIAGNOSIS — K219 Gastro-esophageal reflux disease without esophagitis: Secondary | ICD-10-CM | POA: Diagnosis not present

## 2023-09-12 DIAGNOSIS — E119 Type 2 diabetes mellitus without complications: Secondary | ICD-10-CM | POA: Diagnosis not present

## 2023-09-12 DIAGNOSIS — F0280 Dementia in other diseases classified elsewhere without behavioral disturbance: Secondary | ICD-10-CM | POA: Diagnosis not present

## 2023-09-15 DIAGNOSIS — F0280 Dementia in other diseases classified elsewhere without behavioral disturbance: Secondary | ICD-10-CM | POA: Diagnosis not present

## 2023-09-15 DIAGNOSIS — F0393 Unspecified dementia, unspecified severity, with mood disturbance: Secondary | ICD-10-CM | POA: Diagnosis not present

## 2023-09-15 DIAGNOSIS — F411 Generalized anxiety disorder: Secondary | ICD-10-CM | POA: Diagnosis not present

## 2023-09-15 DIAGNOSIS — F5101 Primary insomnia: Secondary | ICD-10-CM | POA: Diagnosis not present

## 2023-09-15 DIAGNOSIS — Z8673 Personal history of transient ischemic attack (TIA), and cerebral infarction without residual deficits: Secondary | ICD-10-CM | POA: Diagnosis not present

## 2023-09-17 DIAGNOSIS — N390 Urinary tract infection, site not specified: Secondary | ICD-10-CM | POA: Diagnosis not present

## 2023-09-19 DIAGNOSIS — R7989 Other specified abnormal findings of blood chemistry: Secondary | ICD-10-CM | POA: Diagnosis not present

## 2023-09-19 DIAGNOSIS — N390 Urinary tract infection, site not specified: Secondary | ICD-10-CM | POA: Diagnosis not present

## 2023-09-22 DIAGNOSIS — R262 Difficulty in walking, not elsewhere classified: Secondary | ICD-10-CM | POA: Diagnosis not present

## 2023-09-22 DIAGNOSIS — R001 Bradycardia, unspecified: Secondary | ICD-10-CM | POA: Diagnosis not present

## 2023-09-22 DIAGNOSIS — R4781 Slurred speech: Secondary | ICD-10-CM | POA: Diagnosis not present

## 2023-09-22 DIAGNOSIS — R531 Weakness: Secondary | ICD-10-CM | POA: Diagnosis not present

## 2023-09-23 ENCOUNTER — Emergency Department (HOSPITAL_COMMUNITY)

## 2023-09-23 ENCOUNTER — Encounter (HOSPITAL_COMMUNITY): Payer: Self-pay | Admitting: Family Medicine

## 2023-09-23 ENCOUNTER — Other Ambulatory Visit (HOSPITAL_COMMUNITY): Payer: Self-pay | Admitting: *Deleted

## 2023-09-23 ENCOUNTER — Other Ambulatory Visit: Payer: Self-pay

## 2023-09-23 ENCOUNTER — Observation Stay (HOSPITAL_COMMUNITY)
Admission: EM | Admit: 2023-09-23 | Discharge: 2023-09-24 | Disposition: A | Discharge status: Home / Self Care | Attending: Internal Medicine | Admitting: Internal Medicine

## 2023-09-23 ENCOUNTER — Observation Stay (HOSPITAL_COMMUNITY)

## 2023-09-23 DIAGNOSIS — G309 Alzheimer's disease, unspecified: Secondary | ICD-10-CM | POA: Diagnosis not present

## 2023-09-23 DIAGNOSIS — Z7982 Long term (current) use of aspirin: Secondary | ICD-10-CM | POA: Diagnosis not present

## 2023-09-23 DIAGNOSIS — R4182 Altered mental status, unspecified: Secondary | ICD-10-CM | POA: Diagnosis not present

## 2023-09-23 DIAGNOSIS — R739 Hyperglycemia, unspecified: Secondary | ICD-10-CM

## 2023-09-23 DIAGNOSIS — G9340 Encephalopathy, unspecified: Secondary | ICD-10-CM | POA: Diagnosis present

## 2023-09-23 DIAGNOSIS — Z79899 Other long term (current) drug therapy: Secondary | ICD-10-CM | POA: Insufficient documentation

## 2023-09-23 DIAGNOSIS — Z8616 Personal history of COVID-19: Secondary | ICD-10-CM | POA: Diagnosis not present

## 2023-09-23 DIAGNOSIS — F0280 Dementia in other diseases classified elsewhere without behavioral disturbance: Secondary | ICD-10-CM | POA: Diagnosis present

## 2023-09-23 DIAGNOSIS — I959 Hypotension, unspecified: Secondary | ICD-10-CM | POA: Diagnosis not present

## 2023-09-23 DIAGNOSIS — E876 Hypokalemia: Secondary | ICD-10-CM | POA: Diagnosis not present

## 2023-09-23 DIAGNOSIS — R531 Weakness: Principal | ICD-10-CM

## 2023-09-23 DIAGNOSIS — G319 Degenerative disease of nervous system, unspecified: Secondary | ICD-10-CM | POA: Diagnosis not present

## 2023-09-23 DIAGNOSIS — F411 Generalized anxiety disorder: Secondary | ICD-10-CM | POA: Diagnosis present

## 2023-09-23 DIAGNOSIS — G9341 Metabolic encephalopathy: Secondary | ICD-10-CM | POA: Diagnosis not present

## 2023-09-23 DIAGNOSIS — R578 Other shock: Secondary | ICD-10-CM

## 2023-09-23 DIAGNOSIS — D649 Anemia, unspecified: Secondary | ICD-10-CM | POA: Diagnosis not present

## 2023-09-23 DIAGNOSIS — Z8673 Personal history of transient ischemic attack (TIA), and cerebral infarction without residual deficits: Secondary | ICD-10-CM | POA: Diagnosis not present

## 2023-09-23 DIAGNOSIS — I10 Essential (primary) hypertension: Secondary | ICD-10-CM | POA: Diagnosis present

## 2023-09-23 DIAGNOSIS — I6782 Cerebral ischemia: Secondary | ICD-10-CM | POA: Diagnosis not present

## 2023-09-23 DIAGNOSIS — I517 Cardiomegaly: Secondary | ICD-10-CM | POA: Diagnosis not present

## 2023-09-23 DIAGNOSIS — I700 Atherosclerosis of aorta: Secondary | ICD-10-CM | POA: Diagnosis not present

## 2023-09-23 DIAGNOSIS — R410 Disorientation, unspecified: Secondary | ICD-10-CM

## 2023-09-23 DIAGNOSIS — R4781 Slurred speech: Secondary | ICD-10-CM | POA: Diagnosis not present

## 2023-09-23 DIAGNOSIS — E861 Hypovolemia: Secondary | ICD-10-CM

## 2023-09-23 DIAGNOSIS — N390 Urinary tract infection, site not specified: Secondary | ICD-10-CM | POA: Diagnosis not present

## 2023-09-23 LAB — COMPREHENSIVE METABOLIC PANEL WITH GFR
ALT: 34 U/L (ref 0–44)
AST: 47 U/L — ABNORMAL HIGH (ref 15–41)
Albumin: 3.2 g/dL — ABNORMAL LOW (ref 3.5–5.0)
Alkaline Phosphatase: 100 U/L (ref 38–126)
Anion gap: 7 (ref 5–15)
BUN: 14 mg/dL (ref 8–23)
CO2: 24 mmol/L (ref 22–32)
Calcium: 8.6 mg/dL — ABNORMAL LOW (ref 8.9–10.3)
Chloride: 104 mmol/L (ref 98–111)
Creatinine, Ser: 0.84 mg/dL (ref 0.44–1.00)
GFR, Estimated: >60 mL/min (ref >60)
Glucose, Bld: 140 mg/dL — ABNORMAL HIGH (ref 70–99)
Potassium: 3.4 mmol/L — ABNORMAL LOW (ref 3.5–5.1)
Sodium: 135 mmol/L (ref 135–145)
Total Bilirubin: 0.8 mg/dL (ref 0.0–1.2)
Total Protein: 6.4 g/dL — ABNORMAL LOW (ref 6.5–8.1)

## 2023-09-23 LAB — BASIC METABOLIC PANEL WITH GFR
Anion gap: 4 — ABNORMAL LOW (ref 5–15)
BUN: 13 mg/dL (ref 8–23)
CO2: 23 mmol/L (ref 22–32)
Calcium: 8.1 mg/dL — ABNORMAL LOW (ref 8.9–10.3)
Chloride: 111 mmol/L (ref 98–111)
Creatinine, Ser: 0.75 mg/dL (ref 0.44–1.00)
GFR, Estimated: >60 mL/min (ref >60)
Glucose, Bld: 109 mg/dL — ABNORMAL HIGH (ref 70–99)
Potassium: 3.7 mmol/L (ref 3.5–5.1)
Sodium: 138 mmol/L (ref 135–145)

## 2023-09-23 LAB — URINALYSIS, W/ REFLEX TO CULTURE (INFECTION SUSPECTED)
Bacteria, UA: NONE SEEN
Bilirubin Urine: NEGATIVE
Glucose, UA: NEGATIVE mg/dL
Hgb urine dipstick: NEGATIVE
Ketones, ur: NEGATIVE mg/dL
Leukocytes,Ua: NEGATIVE
Nitrite: NEGATIVE
Protein, ur: NEGATIVE mg/dL
Specific Gravity, Urine: 1.003 — ABNORMAL LOW (ref 1.005–1.030)
pH: 7 (ref 5.0–8.0)

## 2023-09-23 LAB — CBC WITH DIFFERENTIAL/PLATELET
Abs Immature Granulocytes: 0.03 10*3/uL (ref 0.00–0.07)
Basophils Absolute: 0 10*3/uL (ref 0.0–0.1)
Basophils Relative: 0 %
Eosinophils Absolute: 0.3 10*3/uL (ref 0.0–0.5)
Eosinophils Relative: 4 %
HCT: 35.3 % — ABNORMAL LOW (ref 36.0–46.0)
Hemoglobin: 11.6 g/dL — ABNORMAL LOW (ref 12.0–15.0)
Immature Granulocytes: 0 %
Lymphocytes Relative: 13 %
Lymphs Abs: 1.1 10*3/uL (ref 0.7–4.0)
MCH: 31.7 pg (ref 26.0–34.0)
MCHC: 32.9 g/dL (ref 30.0–36.0)
MCV: 96.4 fL (ref 80.0–100.0)
Monocytes Absolute: 0.6 10*3/uL (ref 0.1–1.0)
Monocytes Relative: 8 %
Neutro Abs: 6.3 10*3/uL (ref 1.7–7.7)
Neutrophils Relative %: 75 %
Platelets: 169 10*3/uL (ref 150–400)
RBC: 3.66 MIL/uL — ABNORMAL LOW (ref 3.87–5.11)
RDW: 14.2 % (ref 11.5–15.5)
WBC: 8.4 10*3/uL (ref 4.0–10.5)
nRBC: 0 % (ref 0.0–0.2)

## 2023-09-23 LAB — CBC
HCT: 31.5 % — ABNORMAL LOW (ref 36.0–46.0)
Hemoglobin: 10.2 g/dL — ABNORMAL LOW (ref 12.0–15.0)
MCH: 31.8 pg (ref 26.0–34.0)
MCHC: 32.4 g/dL (ref 30.0–36.0)
MCV: 98.1 fL (ref 80.0–100.0)
Platelets: 152 10*3/uL (ref 150–400)
RBC: 3.21 MIL/uL — ABNORMAL LOW (ref 3.87–5.11)
RDW: 14.2 % (ref 11.5–15.5)
WBC: 6 10*3/uL (ref 4.0–10.5)
nRBC: 0 % (ref 0.0–0.2)

## 2023-09-23 LAB — ECHOCARDIOGRAM COMPLETE
AR max vel: 2.47 cm2
AV Area VTI: 2.14 cm2
AV Area mean vel: 2.58 cm2
AV Mean grad: 5 mmHg
AV Peak grad: 12.8 mmHg
Ao pk vel: 1.79 m/s
Area-P 1/2: 4.15 cm2
Height: 65 in
S' Lateral: 2.7 cm
Weight: 2740.76 [oz_av]

## 2023-09-23 LAB — LACTIC ACID, PLASMA: Lactic Acid, Venous: 1.1 mmol/L (ref 0.5–1.9)

## 2023-09-23 LAB — PHOSPHORUS: Phosphorus: 2.2 mg/dL — ABNORMAL LOW (ref 2.5–4.6)

## 2023-09-23 LAB — MAGNESIUM: Magnesium: 2 mg/dL (ref 1.7–2.4)

## 2023-09-23 LAB — TROPONIN I (HIGH SENSITIVITY): Troponin I (High Sensitivity): 11 ng/L (ref <18)

## 2023-09-23 LAB — VITAMIN B12: Vitamin B-12: 485 pg/mL (ref 180–914)

## 2023-09-23 LAB — CORTISOL: Cortisol, Plasma: 3.6 ug/dL

## 2023-09-23 LAB — AMMONIA: Ammonia: 15 umol/L (ref 9–35)

## 2023-09-23 LAB — CBG MONITORING, ED: Glucose-Capillary: 134 mg/dL — ABNORMAL HIGH (ref 70–99)

## 2023-09-23 LAB — MRSA NEXT GEN BY PCR, NASAL: MRSA by PCR Next Gen: NOT DETECTED

## 2023-09-23 LAB — PROCALCITONIN: Procalcitonin: 0.15 ng/mL

## 2023-09-23 LAB — TSH: TSH: 1.903 u[IU]/mL (ref 0.350–4.500)

## 2023-09-23 MED ORDER — ROSUVASTATIN CALCIUM 20 MG PO TABS
20.0000 mg | ORAL_TABLET | Freq: Every day | ORAL | Status: DC
  Administered 2023-09-23 – 2023-09-24 (×2): 20 mg via ORAL
  Filled 2023-09-23 (×2): qty 1

## 2023-09-23 MED ORDER — SODIUM CHLORIDE 0.9 % IV BOLUS
1000.0000 mL | Freq: Once | INTRAVENOUS | Status: AC
  Administered 2023-09-23: 1000 mL via INTRAVENOUS

## 2023-09-23 MED ORDER — ALPRAZOLAM 0.5 MG PO TABS
0.5000 mg | ORAL_TABLET | Freq: Two times a day (BID) | ORAL | Status: DC | PRN
  Administered 2023-09-24: 0.5 mg via ORAL
  Filled 2023-09-23 (×2): qty 1

## 2023-09-23 MED ORDER — PANTOPRAZOLE SODIUM 40 MG PO TBEC
40.0000 mg | DELAYED_RELEASE_TABLET | Freq: Every day | ORAL | Status: DC
  Administered 2023-09-23 – 2023-09-24 (×2): 40 mg via ORAL
  Filled 2023-09-23 (×2): qty 1

## 2023-09-23 MED ORDER — ACETAMINOPHEN 650 MG RE SUPP: 650.0000 mg | Freq: Four times a day (QID) | RECTAL | Status: DC | PRN

## 2023-09-23 MED ORDER — ASPIRIN 81 MG PO TBEC
81.0000 mg | DELAYED_RELEASE_TABLET | Freq: Every day | ORAL | Status: DC
  Administered 2023-09-23 – 2023-09-24 (×2): 81 mg via ORAL
  Filled 2023-09-23 (×2): qty 1

## 2023-09-23 MED ORDER — LACTATED RINGERS IV BOLUS
1000.0000 mL | Freq: Once | INTRAVENOUS | Status: AC
  Administered 2023-09-23: 1000 mL via INTRAVENOUS

## 2023-09-23 MED ORDER — ONDANSETRON HCL 4 MG/2ML IJ SOLN: 4.0000 mg | Freq: Four times a day (QID) | INTRAMUSCULAR | Status: DC | PRN

## 2023-09-23 MED ORDER — LACTATED RINGERS IV SOLN: INTRAVENOUS | Status: DC

## 2023-09-23 MED ORDER — ESCITALOPRAM OXALATE 10 MG PO TABS
10.0000 mg | ORAL_TABLET | Freq: Every day | ORAL | Status: DC
  Administered 2023-09-23 – 2023-09-24 (×2): 10 mg via ORAL
  Filled 2023-09-23 (×2): qty 1

## 2023-09-23 MED ORDER — ENOXAPARIN SODIUM 40 MG/0.4ML IJ SOSY
40.0000 mg | PREFILLED_SYRINGE | INTRAMUSCULAR | Status: DC
  Administered 2023-09-23 – 2023-09-24 (×2): 40 mg via SUBCUTANEOUS
  Filled 2023-09-23 (×2): qty 0.4

## 2023-09-23 MED ORDER — ACETAMINOPHEN 325 MG PO TABS: 650.0000 mg | ORAL_TABLET | Freq: Four times a day (QID) | ORAL | Status: DC | PRN

## 2023-09-23 MED ORDER — LORAZEPAM 2 MG/ML IJ SOLN
0.5000 mg | Freq: Once | INTRAMUSCULAR | Status: AC
  Administered 2023-09-23: 0.5 mg via INTRAVENOUS
  Filled 2023-09-23: qty 1

## 2023-09-23 MED ORDER — COSYNTROPIN 0.25 MG IJ SOLR
0.2500 mg | Freq: Once | INTRAMUSCULAR | Status: AC
  Administered 2023-09-24: 0.25 mg via INTRAVENOUS
  Filled 2023-09-23: qty 0.25

## 2023-09-23 MED ORDER — SENNOSIDES-DOCUSATE SODIUM 8.6-50 MG PO TABS
1.0000 | ORAL_TABLET | Freq: Every evening | ORAL | Status: DC | PRN
  Administered 2023-09-23: 1 via ORAL
  Filled 2023-09-23: qty 1

## 2023-09-23 MED ORDER — CHLORHEXIDINE GLUCONATE CLOTH 2 % EX PADS
6.0000 | MEDICATED_PAD | Freq: Every day | CUTANEOUS | Status: DC
  Administered 2023-09-23 – 2023-09-24 (×2): 6 via TOPICAL

## 2023-09-23 MED ORDER — MIDODRINE HCL 5 MG PO TABS
5.0000 mg | ORAL_TABLET | Freq: Three times a day (TID) | ORAL | Status: DC
  Administered 2023-09-23 – 2023-09-24 (×2): 5 mg via ORAL
  Filled 2023-09-23 (×4): qty 1

## 2023-09-23 MED ORDER — POTASSIUM CHLORIDE CRYS ER 20 MEQ PO TBCR
40.0000 meq | EXTENDED_RELEASE_TABLET | Freq: Once | ORAL | Status: AC
  Administered 2023-09-23: 40 meq via ORAL
  Filled 2023-09-23: qty 2

## 2023-09-23 MED ORDER — SODIUM CHLORIDE 0.9% FLUSH
3.0000 mL | Freq: Two times a day (BID) | INTRAVENOUS | Status: DC
  Administered 2023-09-23 – 2023-09-24 (×3): 3 mL via INTRAVENOUS

## 2023-09-23 MED ORDER — ONDANSETRON HCL 4 MG PO TABS: 4.0000 mg | ORAL_TABLET | Freq: Four times a day (QID) | ORAL | Status: DC | PRN

## 2023-09-23 MED ORDER — HALOPERIDOL LACTATE 5 MG/ML IJ SOLN
10.0000 mg | Freq: Once | INTRAMUSCULAR | Status: AC
Start: 2023-09-23 — End: 2023-09-23
  Administered 2023-09-23: 10 mg via INTRAMUSCULAR
  Filled 2023-09-23: qty 2

## 2023-09-23 NOTE — Progress Notes (Signed)
 Patient is very agitated and super confused and won't let the RN to put on her EKG leads, BP cuff or the oxygen probe, she won't let anybody touch her, gave her Halodol as per ordered, daughter at bedside, trying to come at a later time to get her leads and her vital after she gets settled some, will continue to monitor, MD and charge RN made aware.

## 2023-09-23 NOTE — ED Triage Notes (Signed)
 Pt arrived via [facility] from [facility] cc of weakness and slurred speech. EMS endorses that the pt has Hx of dementia, diabetes, and HTN. EMS endorses facility is unsure if pt has had a stroke in the past. Pts last known normal at 2230 tonight. EMS placed 20g RAC and no medication given. EMS endorses pt takes ASA and valsartan. BGL was 171 VSS for EMS.

## 2023-09-23 NOTE — Care Management Obs Status (Signed)
 MEDICARE OBSERVATION STATUS NOTIFICATION   Patient Details  Name: Diana Smith MRN: [id number] Date of Birth: [birth year]   Medicare Observation Status Notification Given:  Yes    Barron Alvine, RN 09/23/2023, 3:23 PM

## 2023-09-23 NOTE — Progress Notes (Signed)
*  PRELIMINARY RESULTS* Echocardiogram 2D Echocardiogram has been performed.  Stacey Drain 09/23/2023, 4:50 PM

## 2023-09-23 NOTE — ED Notes (Signed)
 This RN made MD Preston Fleeting aware that the pts BP was decreasing and asked if he would like to start fluids and he endorsed not at this time.

## 2023-09-23 NOTE — ED Notes (Signed)
 This RN received report from Narka at [facility] and endorses the pt was sent over for not acting herself. Marcelle Smiling endorses that the pt walks just fine with no problems, minor confusion due to the very beginning stage of dementia, dresses herself, Normally can tell Doralee Albino 4 quarters in a dollar, who the president is, and her birth date with no problems.

## 2023-09-23 NOTE — TOC Initial Note (Signed)
 Transition of Care [facility]) - Initial/Assessment Note    Patient Details  Name: Diana Smith MRN: [id number] Date of Birth: [birth year]  [facility] CM/SW Contact:    Villa Herb, LCSWA Phone Number: 09/23/2023, 11:48 AM  Clinical Narrative:                 CSW noted per chart review that pt admitted to hospital [facility]. CSW spoke to [place] with ALF who states that pt is ambulatory independently with no assistive devices at baseline. Pt is able to complete ADLs independently also. [place] states that pt does have dementia and is lacks orientation at baseline. TOC to follow.     Barriers to Discharge: Continued Medical Work up   Patient Goals and CMS Choice Patient states their goals for this hospitalization and ongoing recovery are:: get better CMS Medicare.gov Compare Post Acute Care list provided to:: Patient Represenative (must comment) Choice offered to / list presented to : Spouse      Expected Discharge Plan and Services In-house Referral: Clinical Social Work Discharge Planning Services: CM Consult Post Acute Care Choice: Resumption of Svcs/PTA Provider Living arrangements for the past 2 months: Assisted Living Facility                                      Prior Living Arrangements/Services Living arrangements for the past 2 months: Assisted Living Facility Lives with:: Facility Resident Patient language and need for interpreter reviewed:: Yes Do you feel safe going back to the place where you live?: Yes      Need for Family Participation in Patient Care: Yes (Comment) Care giver support system in place?: Yes (comment)   Criminal Activity/Legal Involvement Pertinent to Current Situation/Hospitalization: No - Comment as needed  Activities of Daily Living   ADL Screening (condition at time of admission) Independently performs ADLs?: No Does the patient have a NEW difficulty with bathing/dressing/toileting/self-feeding that is  expected to last >3 days?: Yes (Initiates electronic notice to provider for possible OT consult) Does the patient have a NEW difficulty with getting in/out of bed, walking, or climbing stairs that is expected to last >3 days?: Yes (Initiates electronic notice to provider for possible PT consult) Does the patient have a NEW difficulty with communication that is expected to last >3 days?: Yes (Initiates electronic notice to provider for possible SLP consult) Is the patient deaf or have difficulty hearing?: No Does the patient have difficulty seeing, even when wearing glasses/contacts?: No Does the patient have difficulty concentrating, remembering, or making decisions?: Yes  Permission Sought/Granted                  Emotional Assessment Appearance:: Appears stated age       Alcohol / Substance Use: Not Applicable Psych Involvement: No (comment)  Admission diagnosis:  Hypokalemia [E87.6] Confusion [R41.0] Weakness [R53.1] Normochromic normocytic anemia [D64.9] Elevated random blood glucose level [R73.9] Hypotension [I95.9] Patient Active Problem List   Diagnosis Date Noted   Hypotension 09/23/2023   History of ischemic stroke 09/23/2023   Acute encephalopathy 09/23/2023   TIA (transient ischemic attack) 12/26/2022   Dementia due to Alzheimer's disease 09/10/2022   Abdominal pain 04/24/2022   Hypoalbuminemia due to protein-calorie malnutrition 04/24/2022   Pulmonary nodule 04/24/2022   Essential hypertension 04/24/2022   Insomnia 04/24/2022   Actinic keratosis    Allergic rhinitis due to pollen  Cerebrovascular disease    History of COVID-19    Gastro-esophageal reflux disease without esophagitis    Generalized anxiety disorder    Hyperglycemia    Hyperlipidemia    Major depressive disorder    Nonalcoholic steatohepatitis (NASH)    Overactive bladder    History of colonic polyps    Prediabetes    Right bundle branch block    Rosacea    Sciatica, right side     Slow transit constipation    Vitamin B12 deficiency (non anemic)    Vitamin D deficiency    Osteoarthritis of right knee 01/25/2021   PCP:  Mila Palmer, MD Pharmacy:   [facility] [address], [place] - [address] [place] [address] Phone: [phone] Fax: [phone]     Social Drivers of Health (SDOH) Social History: SDOH Screenings   Food Insecurity: No Food Insecurity (09/23/2023)  Housing: Unknown (09/23/2023)  Transportation Needs: No Transportation Needs (09/23/2023)  Utilities: Not At Risk (09/23/2023)  Social Connections: Patient Declined (09/23/2023)  Tobacco Use: Low Risk  (09/23/2023)   SDOH Interventions:     Readmission Risk Interventions     No data to display

## 2023-09-23 NOTE — Progress Notes (Signed)
 Patient seen and evaluated, chart reviewed, please see EMR for updated orders. Please see full H&P dictated by admitting physician Dr Antionette Char for same date of service.   Brief Summary: -78 y.o. female with medical history significant for Alzheimer dementia, h/o CVA, hypertension, and anxiety admitted on 09/23/2023 from [facility] memory care unit with with slurred speech and abnormal gait , altered mentation and hypotension  A/p 1)Hypotension -No fevers, no leukocytosis, chest x-ray unremarkable and UA is not suggestive of UTI---low clinical index of  suspicion for infection -Continue to hold valsartan -Continue IV fluids --Midodrine for pressure support at this time Abnormal a.m. cortisol level noted (3.6) -ACTH stimulation test to be completed on 09/24/2023--- - possible discharge on 09/24/2023 after results of ACTH stimulation test available -If ACTH stimulation test is normal ,may discharge home on midodrine for orthostatic hypotension concerns --If ACTH stimulation test is Abnormal , then consider discharge home on [place] and consider follow-up with Dr. Purcell Nails -Echocardiogram requested report pending at the time of this dictation  2) acute metabolic encephalopathy superimposed on underlying advanced dementia--suspect due to hypotension as above -CT head without acute strokes or other acute findings -MRI brain without contrast requested report pending at the time of this dictation -Husband at bedside patient at baseline with significant cognitive and memory deficits -She resides at [facility] memory care unit -Continue Lexapro -Xanax on hold   3)H/o Prior CVA-no new acute focal deficit especially -Hypotension and altered mentation as noted above -CT head without acute strokes or other acute findings -MRI brain without contrast requested report pending at the time of this dictation --Crestor and aspirin for secondary stroke prophylaxis  4)GERD--continue Protonix  5)  social/ethics--discussed with husband at bedside, questions answered patient is a DNR/DNI -  Patient seen and evaluated, chart reviewed, please see EMR for updated orders. Please see full H&P dictated by admitting physician Dr Antionette Char for same date of service.   - Total care time is 43 minutes  Shon Hale, MD

## 2023-09-23 NOTE — ED Provider Notes (Signed)
 [facility] AT [name] Provider Note   CSN: [id number] Arrival date & time: 09/23/23  0033     History  Chief Complaint  Patient presents with   Weakness    Diana Smith is a 78 y.o. female.  The history is provided by the nursing home and the EMS personnel. The history is limited by the condition of the patient (Dementia).  Weakness She has history of hypertension, prediabetes, hyperlipidemia, dementia and was sent from a skilled nursing facility because of slurred speech and difficulty walking.  Her baseline is not known other than she does have significant dementia.  Patient is not able to give any history.  EMS reports equal grip strength and no slurred speech per their evaluation.   Home Medications Prior to Admission medications   Medication Sig Start Date End Date Taking? Authorizing Provider  ALPRAZolam (XANAX) 0.25 MG tablet Take 0.125-0.25 mg by mouth See admin instructions. Take 1/2 tablet by mouth daily if needed take 1 tablet as needed for anxiety 12/23/20   [provider]  aspirin EC 81 MG tablet Take 1 tablet (81 mg total) by mouth daily. Swallow whole. 12/28/22   Shon Hale, MD  calcium carbonate (OSCAL) 1500 (600 Ca) MG TABS tablet Take 600 mg of elemental calcium by mouth in the morning and at bedtime.    [provider]  escitalopram (LEXAPRO) 10 MG tablet Take 10 mg by mouth daily.    [provider]  Melatonin 10 MG CAPS Take 10 mg by mouth at bedtime.    [provider]  memantine (NAMENDA) 10 MG tablet  08/06/22   [provider]  Multiple Vitamins-Minerals (HAIR SKIN & NAILS PO) Take 1 tablet by mouth daily.    [provider]  omeprazole (PRILOSEC) 20 MG capsule Take 2 capsules (40 mg total) by mouth daily. 04/26/22   Vassie Loll, MD  Probiotic Product (PROBIOTIC DAILY PO) Take 1 Capful by mouth daily.    [provider]  QUEtiapine (SEROQUEL) 50 MG  tablet Take 1 tablet (50 mg total) by mouth at bedtime. 09/07/23   Kommor, Madison, MD  QUEtiapine (SEROQUEL) 50 MG tablet Take 1 tablet (50 mg total) by mouth 2 (two) times daily as needed (for agitation). 09/07/23   Kommor, Madison, MD  rosuvastatin (CRESTOR) 20 MG tablet Take 1 tablet (20 mg total) by mouth daily. 12/27/22 12/27/23  Shon Hale, MD  thiamine (VITAMIN B-1) 100 MG tablet Take 1 tablet (100 mg total) by mouth daily. 04/27/22   Vassie Loll, MD  valsartan (DIOVAN) 40 MG tablet Take 1 tablet (40 mg total) by mouth daily. 04/26/22   Vassie Loll, MD      Allergies    Dynacin [minocycline hcl], Gatifloxacin, Keflex [cephalexin], Lorabid [loracarbef], and Minocycline    Review of Systems   Review of Systems  Unable to perform ROS: Dementia  Neurological:  Positive for weakness.    Physical Exam Updated Vital Signs BP (!) 92/43   Pulse 100   Temp 98.7 F (37.1 C) (Oral)   Resp 20   Ht 5\' 5"  (1.651 m)   Wt 75.3 kg   SpO2 95%   BMI 27.62 kg/m  Physical Exam Vitals and nursing note reviewed.   78 year old female, resting comfortably and in no acute distress. Vital signs are normal. Oxygen saturation is 94%, which is normal. Head is normocephalic and atraumatic. PERRLA, EOMI.  Neck is nontender and supple without adenopathy or JVD.  There are no carotid bruits. Lungs are clear without rales, wheezes, or rhonchi. Chest is nontender. Heart has regular rate and rhythm without murmur. Abdomen is soft, flat, nontender. Extremities have no cyanosis or edema, full range of motion is present. Skin is warm and dry without rash. Neurologic: Awake and alert, oriented to person but not place or time.  She is poorly cooperative for motor exam, but strength is symmetric.  Speech is without dysarthria.  She needs assistance for walking but seems to use both legs equally well.  ED Results / Procedures / Treatments   Labs (all labs ordered are listed, but only abnormal results  are displayed) Labs Reviewed  COMPREHENSIVE METABOLIC PANEL WITH GFR - Abnormal; Notable for the following components:      Result Value   Potassium 3.4 (*)    Glucose, Bld 140 (*)    Calcium 8.6 (*)    Total Protein 6.4 (*)    Albumin 3.2 (*)    AST 47 (*)    All other components within normal limits  CBC WITH DIFFERENTIAL/PLATELET - Abnormal; Notable for the following components:   RBC 3.66 (*)    Hemoglobin 11.6 (*)    HCT 35.3 (*)    All other components within normal limits  URINALYSIS, W/ REFLEX TO CULTURE (INFECTION SUSPECTED) - Abnormal; Notable for the following components:   Color, Urine STRAW (*)    Specific Gravity, Urine 1.003 (*)    All other components within normal limits  CBG MONITORING, ED - Abnormal; Notable for the following components:   Glucose-Capillary 134 (*)    All other components within normal limits  LACTIC ACID, PLASMA    EKG EKG Interpretation Date/Time:  Friday September 23 2023 00:39:16 EDT Ventricular Rate:  109 PR Interval:    QRS Duration:  148 QT Interval:  398 QTC Calculation: 460 R Axis:   257  Text Interpretation: Sinus rhythm Premature atrial complexes RBBB and LAFB When compared with ECG of 12/26/2022, Premature atrial complexes are now present Confirmed by Dione Booze ([id number]) on 09/23/2023 12:50:58 AM  Radiology CT Head Wo Contrast Result Date: 09/23/2023 CLINICAL DATA:  Altered mental status and slurred speech, initial encounter EXAM: CT HEAD WITHOUT CONTRAST TECHNIQUE: Contiguous axial images were obtained from the base of the skull through the vertex without intravenous contrast. RADIATION DOSE REDUCTION: This exam was performed according to the departmental dose-optimization program which includes automated exposure control, adjustment of the mA and/or kV according to patient size and/or use of iterative reconstruction technique. COMPARISON:  12/26/2022 FINDINGS: Brain: No evidence of acute infarction, hemorrhage, hydrocephalus,  extra-axial collection or mass lesion/mass effect. Chronic atrophic changes are noted. Vascular: No hyperdense vessel or unexpected calcification. Skull: Normal. Negative for fracture or focal lesion. Sinuses/Orbits: No acute finding. Other: None. IMPRESSION: Chronic atrophic changes without acute abnormality. Electronically Signed   By: Alcide Clever M.D.   On: 09/23/2023 02:38   DG Chest Port 1 View Result Date: 09/23/2023 CLINICAL DATA:  Weakness.  Altered mental status. EXAM: PORTABLE CHEST 1 VIEW COMPARISON:  06/29/2023 FINDINGS: Cardiac enlargement. No vascular congestion, edema, or consolidation. No pleural effusion or pneumothorax. Mediastinal contours appear intact. Calcified and tortuous aorta. Degenerative changes in the spine and shoulders. IMPRESSION: Cardiac enlargement.  No evidence of active pulmonary disease. Electronically Signed   By: Burman Nieves M.D.   On: 09/23/2023 01:01    Procedures Procedures  Cardiac monitor shows sinus rhythm with frequent premature atrial contractions, per my interpretation.  Medications Ordered  in ED Medications  potassium chloride SA (KLOR-CON M) CR tablet 40 mEq (40 mEq Oral Given 09/23/23 0128)  sodium chloride 0.9 % bolus 1,000 mL (1,000 mLs Intravenous New Bag/Given 09/23/23 0213)    ED Course/ Medical Decision Making/ A&P                                 Medical Decision Making Amount and/or Complexity of Data Reviewed Labs: ordered. Radiology: ordered.  Risk Prescription drug management.   Weakness, no evidence of stroke on exam.  This is a presentation with many different treatment options and entails a significant risk of morbidity and complications.  Differential diagnosis includes, but is not limited to, electrolyte disturbance, anemia, occult infection.  I have reviewed her electrocardiogram, and my interpretation is frequent PACs, right bundle branch and left anterior fascicular block.  I have ordered workup including CBC,  comprehensive metabolic panel, urinalysis, chest x-ray.  I have reviewed her past records and note ED visit on 09/08/2023 with diagnosis of UTI treated with cephalexin.  Of note, urine culture had no growth.  I have reviewed her laboratory tests, and my interpretation is normal urinalysis, mild hypokalemia, mildly elevated random glucose level, minimal elevation of AST, stable anemia, normal WBC and differential.  I have ordered a dose of oral potassium.  Patient did become hypotensive and I ordered IV fluids.  Because of low blood pressure, I ordered a lactic acid level which has come back normal.  Chest x-ray shows cardiomegaly but no evidence of pneumonia.  CT of head shows atrophic changes without acute changes.  Have independently viewed all of the images, and agree with radiologist's interpretation.  Will we have been in touch with the nursing home where she lives, and they state that patient normally is fully ambulatory and oriented and conversant.  Because of mental status change, I feel she will need hospital admission for further workup.  I have discussed the case with Dr. Antionette Char of [facility], who agrees to admit the patient.  Final Clinical Impression(s) / ED Diagnoses Final diagnoses:  Weakness  Confusion  Hypokalemia  Normochromic normocytic anemia  Elevated random blood glucose level    Rx / DC Orders ED Discharge Orders     None         Dione Booze, MD 09/23/23 [phone]

## 2023-09-23 NOTE — ED Notes (Signed)
 Pt was able to answer where she is and her name but did not know why she is here or the year. Pt was able to ambulate with assistance x2.

## 2023-09-23 NOTE — Plan of Care (Signed)
  Problem: Education: Goal: Knowledge of General Education information will improve Description: Including pain rating scale, medication(s)/side effects and non-pharmacologic comfort measures Outcome: Not Progressing   Problem: Health Behavior/Discharge Planning: Goal: Ability to manage health-related needs will improve Outcome: Progressing   Problem: Clinical Measurements: Goal: Ability to maintain clinical measurements within normal limits will improve Outcome: Progressing Goal: Will remain free from infection Outcome: Progressing Goal: Diagnostic test results will improve Outcome: Progressing Goal: Respiratory complications will improve Outcome: Progressing Goal: Cardiovascular complication will be avoided Outcome: Progressing   Problem: Activity: Goal: Risk for activity intolerance will decrease Outcome: Not Progressing   Problem: Nutrition: Goal: Adequate nutrition will be maintained Outcome: Not Progressing   Problem: Coping: Goal: Level of anxiety will decrease Outcome: Progressing   Problem: Elimination: Goal: Will not experience complications related to bowel motility Outcome: Progressing Goal: Will not experience complications related to urinary retention Outcome: Progressing   Problem: Pain Managment: Goal: General experience of comfort will improve and/or be controlled Outcome: Progressing   Problem: Safety: Goal: Ability to remain free from injury will improve Outcome: Progressing   Problem: Skin Integrity: Goal: Risk for impaired skin integrity will decrease Outcome: Progressing

## 2023-09-23 NOTE — H&P (Addendum)
 History and Physical    Diana Smith JXB:[id number] DOB: Sep 18, 1945 DOA: 09/23/2023  PCP: Mila Palmer, MD   Patient coming from: ALF   Chief Complaint: Slurred speech, abnormal gait  HPI: Diana Smith is a 78 y.o. female with medical history significant for Alzheimer dementia, CVA, hypertension, and anxiety who presents with slurred speech and abnormal gait.   Patient was sent from her facility where staff reports that she was noted to have slurred speech and abnormal gait.  Her husband notes that she has not been eating or drinking much recently, was treated with 2 different antibiotics over the past 3 weeks for UTI that she had difficulty clearing, became flushed yesterday and felt warm, but had normal temperature.   Patient denies any pain at this time and specifically denies chest pain, abdominal pain, or headache.  ED Course: Upon arrival to the ED, patient is found to be afebrile and saturating mid 90s on room air with normal heart rate and low blood pressure.  EKG demonstrate sinus rhythm with PACs, RBBB, and LAFB.  There are no acute findings on chest x-ray or head CT.  Labs are most notable for normal renal function, normal WBC, hemoglobin 11.6, and normal lactic acid.  Patient was given a liter of normal saline and oral potassium in the ED.  Review of Systems:  ROS limited by patient's clinical condition.  Past Medical History:  Diagnosis Date   Abdominal pain 04/24/2022   Actinic keratosis    Allergic rhinitis due to pollen    Cerebrovascular disease    Dementia due to Alzheimer's disease 09/10/2022   Essential hypertension 04/24/2022   Frequent urinary tract infections    Gastro-esophageal reflux disease without esophagitis    Generalized anxiety disorder    Heartburn    Hematochezia    History of COVID-19    Hyperglycemia    Hyperlipidemia    Hypoalbuminemia due to protein-calorie malnutrition 04/24/2022   Insomnia 04/24/2022   Major depressive  disorder    Nonalcoholic steatohepatitis (NASH)    Osteoarthritis of left knee 03/21/2022   Osteoarthritis of right knee 01/25/2021   Overactive bladder    Personal history of colonic polyps    Prediabetes    Pulmonary nodule 04/24/2022   Pure hypercholesterolemia    Right bundle branch block    Rosacea    Sciatica, right side    Slow transit constipation    Starvation ketoacidosis 04/24/2022   UTI (urinary tract infection) 04/24/2022   Vitamin B12 deficiency (non anemic)    Vitamin D deficiency     Past Surgical History:  Procedure Laterality Date   HYSTEROTOMY     MASS EXCISION  10/08/2011   Procedure: MINOR EXCISION OF MASS;  Surgeon: Wyn Forster., MD;  Location: [facility];  Service: Orthopedics;  Laterality: Left;  excisional biopsy dorsum left long finger MCP joint    Social History:   reports that she has never smoked. She has never used smokeless tobacco. She reports that she does not drink alcohol and does not use drugs.  Allergies  Allergen Reactions   Dynacin [Minocycline Hcl]    Gatifloxacin Other (See Comments)   Keflex [Cephalexin] Other (See Comments)   Lorabid [Loracarbef]    Minocycline Other (See Comments)    Family History  Problem Relation Age of Onset   Dementia Father    Memory loss Father    Breast cancer Neg Hx      Prior to Admission medications  Medication Sig Start Date End Date Taking? Authorizing Provider  ALPRAZolam (XANAX) 0.25 MG tablet Take 0.125-0.25 mg by mouth See admin instructions. Take 1/2 tablet by mouth daily if needed take 1 tablet as needed for anxiety 12/23/20   [provider]  aspirin EC 81 MG tablet Take 1 tablet (81 mg total) by mouth daily. Swallow whole. 12/28/22   Shon Hale, MD  calcium carbonate (OSCAL) 1500 (600 Ca) MG TABS tablet Take 600 mg of elemental calcium by mouth in the morning and at bedtime.    [provider]  escitalopram (LEXAPRO) 10 MG tablet Take 10 mg  by mouth daily.    [provider]  Melatonin 10 MG CAPS Take 10 mg by mouth at bedtime.    [provider]  memantine (NAMENDA) 10 MG tablet  08/06/22   [provider]  Multiple Vitamins-Minerals (HAIR SKIN & NAILS PO) Take 1 tablet by mouth daily.    [provider]  omeprazole (PRILOSEC) 20 MG capsule Take 2 capsules (40 mg total) by mouth daily. 04/26/22   Vassie Loll, MD  Probiotic Product (PROBIOTIC DAILY PO) Take 1 Capful by mouth daily.    [provider]  QUEtiapine (SEROQUEL) 50 MG tablet Take 1 tablet (50 mg total) by mouth at bedtime. 09/07/23   Kommor, Madison, MD  QUEtiapine (SEROQUEL) 50 MG tablet Take 1 tablet (50 mg total) by mouth 2 (two) times daily as needed (for agitation). 09/07/23   Kommor, Madison, MD  rosuvastatin (CRESTOR) 20 MG tablet Take 1 tablet (20 mg total) by mouth daily. 12/27/22 12/27/23  Shon Hale, MD  thiamine (VITAMIN B-1) 100 MG tablet Take 1 tablet (100 mg total) by mouth daily. 04/27/22   Vassie Loll, MD  valsartan (DIOVAN) 40 MG tablet Take 1 tablet (40 mg total) by mouth daily. 04/26/22   Vassie Loll, MD    Physical Exam: Vitals:   09/23/23 0205 09/23/23 0242 09/23/23 0306 09/23/23 0334  BP: (!) 87/62 (!) 92/43 (!) 103/51 90/61  Pulse: 60 100 90 84  Resp: (!) 28 20 18 18   Temp:      TempSrc:      SpO2: 96% 95% 94% 98%  Weight:      Height:        Constitutional: NAD, no pallor or diaphoresis   Eyes: PERTLA, lids and conjunctivae normal ENMT: Mucous membranes are moist. Posterior pharynx clear of any exudate or lesions.   Neck: supple, no masses  Respiratory: no wheezing, no crackles. No accessory muscle use.  Cardiovascular: S1 & S2 heard, regular rate and rhythm. No extremity edema.  Abdomen: No tenderness, soft. Bowel sounds active.  Musculoskeletal: no clubbing / cyanosis. No joint deformity upper and lower extremities.   Skin: no significant rashes, lesions, ulcers. Warm, dry,  well-perfused. Neurologic: CN 2-12 grossly intact. Sensation to light touch intact. Strength 5/5 in all 4 limbs. Alert and oriented to person and place only.  Psychiatric: Calm. Cooperative.    Labs and Imaging on Admission: I have personally reviewed following labs and imaging studies  CBC: Recent Labs  Lab 09/23/23 0035  WBC 8.4  NEUTROABS 6.3  HGB 11.6*  HCT 35.3*  MCV 96.4  PLT 169   Basic Metabolic Panel: Recent Labs  Lab 09/23/23 0035  NA 135  K 3.4*  CL 104  CO2 24  GLUCOSE 140*  BUN 14  CREATININE 0.84  CALCIUM 8.6*   GFR: Estimated Creatinine Clearance: 56.9 mL/min (by C-G formula based on SCr  of 0.84 mg/dL). Liver Function Tests: Recent Labs  Lab 09/23/23 0035  AST 47*  ALT 34  ALKPHOS 100  BILITOT 0.8  PROT 6.4*  ALBUMIN 3.2*   No results for input(s): "LIPASE", "AMYLASE" in the last 168 hours. No results for input(s): "AMMONIA" in the last 168 hours. Coagulation Profile: No results for input(s): "INR", "PROTIME" in the last 168 hours. Cardiac Enzymes: No results for input(s): "CKTOTAL", "CKMB", "CKMBINDEX", "TROPONINI" in the last 168 hours. BNP (last 3 results) No results for input(s): "PROBNP" in the last 8760 hours. HbA1C: No results for input(s): "HGBA1C" in the last 72 hours. CBG: Recent Labs  Lab 09/23/23 0036  GLUCAP 134*   Lipid Profile: No results for input(s): "CHOL", "HDL", "LDLCALC", "TRIG", "CHOLHDL", "LDLDIRECT" in the last 72 hours. Thyroid Function Tests: No results for input(s): "TSH", "T4TOTAL", "FREET4", "T3FREE", "THYROIDAB" in the last 72 hours. Anemia Panel: No results for input(s): "VITAMINB12", "FOLATE", "FERRITIN", "TIBC", "IRON", "RETICCTPCT" in the last 72 hours. Urine analysis:    Component Value Date/Time   COLORURINE STRAW (A) 09/23/2023 0106   APPEARANCEUR CLEAR 09/23/2023 0106   LABSPEC 1.003 (L) 09/23/2023 0106   PHURINE 7.0 09/23/2023 0106   GLUCOSEU NEGATIVE 09/23/2023 0106   HGBUR NEGATIVE  09/23/2023 0106   BILIRUBINUR NEGATIVE 09/23/2023 0106   BILIRUBINUR negative 08/24/2023 1124   KETONESUR NEGATIVE 09/23/2023 0106   PROTEINUR NEGATIVE 09/23/2023 0106   UROBILINOGEN 0.2 08/24/2023 1124   NITRITE NEGATIVE 09/23/2023 0106   LEUKOCYTESUR NEGATIVE 09/23/2023 0106   Sepsis Labs: @LABRCNTIP (procalcitonin:4,lacticidven:4) )No results found for this or any previous visit (from the past 240 hours).   Radiological Exams on Admission: CT Head Wo Contrast Result Date: 09/23/2023 CLINICAL DATA:  Altered mental status and slurred speech, initial encounter EXAM: CT HEAD WITHOUT CONTRAST TECHNIQUE: Contiguous axial images were obtained from the base of the skull through the vertex without intravenous contrast. RADIATION DOSE REDUCTION: This exam was performed according to the departmental dose-optimization program which includes automated exposure control, adjustment of the mA and/or kV according to patient size and/or use of iterative reconstruction technique. COMPARISON:  12/26/2022 FINDINGS: Brain: No evidence of acute infarction, hemorrhage, hydrocephalus, extra-axial collection or mass lesion/mass effect. Chronic atrophic changes are noted. Vascular: No hyperdense vessel or unexpected calcification. Skull: Normal. Negative for fracture or focal lesion. Sinuses/Orbits: No acute finding. Other: None. IMPRESSION: Chronic atrophic changes without acute abnormality. Electronically Signed   By: Alcide Clever M.D.   On: 09/23/2023 02:38   DG Chest Port 1 View Result Date: 09/23/2023 CLINICAL DATA:  Weakness.  Altered mental status. EXAM: PORTABLE CHEST 1 VIEW COMPARISON:  06/29/2023 FINDINGS: Cardiac enlargement. No vascular congestion, edema, or consolidation. No pleural effusion or pneumothorax. Mediastinal contours appear intact. Calcified and tortuous aorta. Degenerative changes in the spine and shoulders. IMPRESSION: Cardiac enlargement.  No evidence of active pulmonary disease. Electronically  Signed   By: Burman Nieves M.D.   On: 09/23/2023 01:01    EKG: Independently reviewed. Sinus rhythm, PACs, RBBB, LAFB.   Assessment/Plan   1. Hypotension  - She is afebrile, WBC and lactate are normal, Hgb is stable with no over bleeding, and there is no chest pain or respiratory s/s    - Hold valsartan, continue IVF resuscitation, check procalcitonin, blood cultures, troponin, cortisol, repeat H&H, and echocardiogram   2. Acute encephalopathy  - Patient is disoriented but no focal deficits identified  - Check procalcitonin, ammonia, TSH, RPR, and B12, use delirium precautions, consider MRI brain if this fails to  identify etiology and she remains altered despite treatment of hypotension    3. Hx of CVA  - Continue ASA and Crestor   4. Anxiety, depression  - Continue Lexapro, hold Xanax for now    5. Dementia  - Delirium precautions   6. Hypokalemia  - Replacing      DVT prophylaxis: Lovenox  Code Status: DNR per husband Level of Care: Level of care: Stepdown Family Communication: Husband at bedside  Disposition Plan:  Patient is from: ALF  Anticipated d/c is to: TBD Anticipated d/c date is: Possibly as early as 09/24/23  Patient currently: Pending further workup of hypotension and AMS Consults called: None  Admission status: Observation     Briscoe Deutscher, MD [facility]  09/23/2023, 4:21 AM

## 2023-09-24 DIAGNOSIS — G309 Alzheimer's disease, unspecified: Secondary | ICD-10-CM | POA: Diagnosis not present

## 2023-09-24 DIAGNOSIS — R410 Disorientation, unspecified: Secondary | ICD-10-CM

## 2023-09-24 DIAGNOSIS — F0280 Dementia in other diseases classified elsewhere without behavioral disturbance: Secondary | ICD-10-CM | POA: Diagnosis not present

## 2023-09-24 DIAGNOSIS — I10 Essential (primary) hypertension: Secondary | ICD-10-CM | POA: Diagnosis not present

## 2023-09-24 DIAGNOSIS — F411 Generalized anxiety disorder: Secondary | ICD-10-CM | POA: Diagnosis not present

## 2023-09-24 DIAGNOSIS — R531 Weakness: Secondary | ICD-10-CM | POA: Diagnosis not present

## 2023-09-24 DIAGNOSIS — I959 Hypotension, unspecified: Secondary | ICD-10-CM | POA: Diagnosis not present

## 2023-09-24 LAB — BASIC METABOLIC PANEL WITH GFR
Anion gap: 5 (ref 5–15)
BUN: 10 mg/dL (ref 8–23)
CO2: 21 mmol/L — ABNORMAL LOW (ref 22–32)
Calcium: 8.3 mg/dL — ABNORMAL LOW (ref 8.9–10.3)
Chloride: 115 mmol/L — ABNORMAL HIGH (ref 98–111)
Creatinine, Ser: 0.65 mg/dL (ref 0.44–1.00)
GFR, Estimated: >60 mL/min (ref >60)
Glucose, Bld: 123 mg/dL — ABNORMAL HIGH (ref 70–99)
Potassium: 3.7 mmol/L (ref 3.5–5.1)
Sodium: 141 mmol/L (ref 135–145)

## 2023-09-24 LAB — CBC
HCT: 29.6 % — ABNORMAL LOW (ref 36.0–46.0)
Hemoglobin: 9.5 g/dL — ABNORMAL LOW (ref 12.0–15.0)
MCH: 31.4 pg (ref 26.0–34.0)
MCHC: 32.1 g/dL (ref 30.0–36.0)
MCV: 97.7 fL (ref 80.0–100.0)
Platelets: 153 10*3/uL (ref 150–400)
RBC: 3.03 MIL/uL — ABNORMAL LOW (ref 3.87–5.11)
RDW: 14.6 % (ref 11.5–15.5)
WBC: 5.1 10*3/uL (ref 4.0–10.5)
nRBC: 0 % (ref 0.0–0.2)

## 2023-09-24 LAB — RPR: RPR Ser Ql: NONREACTIVE

## 2023-09-24 LAB — ACTH STIMULATION, 3 TIME POINTS
Cortisol, 30 Min: 26.4 ug/dL
Cortisol, 60 Min: 30.7 ug/dL
Cortisol, Base: 11.9 ug/dL

## 2023-09-24 MED ORDER — MIDODRINE HCL 5 MG PO TABS: 5.0000 mg | ORAL_TABLET | Freq: Three times a day (TID) | ORAL | 1 refills | Status: DC

## 2023-09-24 MED ORDER — ALPRAZOLAM 0.25 MG PO TABS: 0.2500 mg | ORAL_TABLET | Freq: Three times a day (TID) | ORAL | 0 refills | Status: DC | PRN

## 2023-09-24 MED ORDER — QUETIAPINE FUMARATE 25 MG PO TABS: 25.0000 mg | ORAL_TABLET | Freq: Two times a day (BID) | ORAL | 0 refills | Status: DC

## 2023-09-24 MED ORDER — QUETIAPINE FUMARATE 25 MG PO TABS: 25.0000 mg | ORAL_TABLET | Freq: Two times a day (BID) | ORAL | Status: DC

## 2023-09-24 MED ORDER — VALSARTAN 40 MG PO TABS: 20.0000 mg | ORAL_TABLET | Freq: Every day | ORAL | 1 refills | Status: DC

## 2023-09-24 MED ORDER — VALSARTAN 40 MG PO TABS: 20.0000 mg | ORAL_TABLET | Freq: Every day | ORAL | Status: DC

## 2023-09-24 MED ORDER — UNABLE TO FIND: 1.0000 | Freq: Four times a day (QID) | 0 refills | Status: DC | PRN

## 2023-09-24 NOTE — Progress Notes (Signed)
   09/23/23 1146  [place] Medicaid FL2 Level of Care Screening Tool  Provider Number [facility]  Facility and Address [facility]  Relative Name and Phone Jericho Cieslik [phone]  Current Level of Care Hospital  Requested Level of Care ALF;Memory Care  Discharge Plan Other (Comment) (ALF Memory Care)  Orientation Self  Personal Care Assistance Bathing;Feeding;Dressing  Bathing Assistance Limited assistance  Feeding assistance Independent  Dressing Assistance Limited assistance  Ambulatory Status Independent  Functional Limitations Sight;Hearing;Speech  Sight Info Impaired  Hearing Info Adequate  Speech Info Adequate  Bladder Incontinent  Bowel Continent  Communication of Needs Verbally  Skin Normal  Respiration Normal  Nutrition Status Diet (Regular)  Additional Special Care Factors Code Status;Allergies  Code Status Info FULL  Allergies Info Dynancin (minocycline Hcl), Gatifloxacin, Keflex (cephalexin), Lorabid (loracarbef), and Minocycline  Contractures Info Not present  Additional Information SSN: [id number]

## 2023-09-24 NOTE — [place] FL2 (Signed)
 [place] MEDICAID FL2 LEVEL OF CARE FORM     IDENTIFICATION  Patient Name: Diana Smith Birthdate: Dec 29, 1945 Sex: female Admission Date (Current Location): 09/23/2023  [facility] and [place] Number:  [employer] and Address:  [facility],  [address], [place] [address]      Provider Number: [id number]  Attending Physician Name and Address:  Vassie Loll, MD  Relative Name and Phone Number:  Jeorgia Helming [phone]    Current Level of Care: Hospital Recommended Level of Care: Assisted Living Facility, Memory Care Prior Approval Number:    Date Approved/Denied:   PASRR Number:    Discharge Plan: Other (Comment) (ALF Memory Care)    Current Diagnoses: Patient Active Problem List   Diagnosis Date Noted   Hypotension 09/23/2023   History of ischemic stroke 09/23/2023   Acute encephalopathy 09/23/2023   TIA (transient ischemic attack) 12/26/2022   Dementia due to Alzheimer's disease 09/10/2022   Abdominal pain 04/24/2022   Hypoalbuminemia due to protein-calorie malnutrition 04/24/2022   Pulmonary nodule 04/24/2022   Essential hypertension 04/24/2022   Insomnia 04/24/2022   Actinic keratosis    Allergic rhinitis due to pollen    Cerebrovascular disease    History of COVID-19    Gastro-esophageal reflux disease without esophagitis    Generalized anxiety disorder    Hyperglycemia    Hyperlipidemia    Major depressive disorder    Nonalcoholic steatohepatitis (NASH)    Overactive bladder    History of colonic polyps    Prediabetes    Right bundle branch block    Rosacea    Sciatica, right side    Slow transit constipation    Vitamin B12 deficiency (non anemic)    Vitamin D deficiency    Osteoarthritis of right knee 01/25/2021    Orientation RESPIRATION BLADDER Height & Weight     Self  Normal Incontinent Weight: 77.7 kg Height:  5\' 5"  (165.1 cm)  BEHAVIORAL SYMPTOMS/MOOD NEUROLOGICAL BOWEL NUTRITION STATUS      Continent  Diet (Regular)  AMBULATORY STATUS COMMUNICATION OF NEEDS Skin   Independent Verbally Normal                       Personal Care Assistance Level of Assistance  Bathing, Feeding, Dressing Bathing Assistance: Limited assistance Feeding assistance: Independent Dressing Assistance: Limited assistance     Functional Limitations Info  Sight, Hearing, Speech Sight Info: Impaired Hearing Info: Adequate Speech Info: Adequate    SPECIAL CARE FACTORS FREQUENCY                       Contractures Contractures Info: Not present    Additional Factors Info  Code Status, Allergies Code Status Info: FULL Allergies Info: Dynancin (minocycline Hcl), Gatifloxacin, Keflex (cephalexin), Lorabid (loracarbef), and Minocycline           Current Medications (09/24/2023):  This is the current hospital active medication list Current Facility-Administered Medications  Medication Dose Route Frequency Provider Last Rate Last Admin   acetaminophen (TYLENOL) tablet 650 mg  650 mg Oral Q6H PRN Opyd, Lavone Neri, MD       Or   acetaminophen (TYLENOL) suppository 650 mg  650 mg Rectal Q6H PRN Opyd, Lavone Neri, MD       ALPRAZolam Prudy Feeler) tablet 0.5 mg  0.5 mg Oral BID PRN Shon Hale, MD       aspirin EC tablet 81 mg  81 mg Oral Daily Opyd, Marcial Pacas  S, MD   81 mg at 09/24/23 1005   Chlorhexidine Gluconate Cloth 2 % PADS 6 each  6 each Topical Daily Opyd, Lavone Neri, MD   6 each at 09/24/23 1006   enoxaparin (LOVENOX) injection 40 mg  40 mg Subcutaneous Q24H Opyd, Lavone Neri, MD   40 mg at 09/24/23 1006   escitalopram (LEXAPRO) tablet 10 mg  10 mg Oral Daily Opyd, Lavone Neri, MD   10 mg at 09/24/23 1004   lactated ringers infusion   Intravenous Continuous Emokpae, Courage, MD 150 mL/hr at 09/24/23 0538 New Bag at 09/24/23 0538   midodrine (PROAMATINE) tablet 5 mg  5 mg Oral TID WC Emokpae, Courage, MD   5 mg at 09/23/23 1142   ondansetron (ZOFRAN) tablet 4 mg  4 mg Oral Q6H PRN Opyd, Lavone Neri, MD        Or   ondansetron (ZOFRAN) injection 4 mg  4 mg Intravenous Q6H PRN Opyd, Lavone Neri, MD       pantoprazole (PROTONIX) EC tablet 40 mg  40 mg Oral Daily Opyd, Lavone Neri, MD   40 mg at 09/24/23 1005   rosuvastatin (CRESTOR) tablet 20 mg  20 mg Oral Daily Opyd, Lavone Neri, MD   20 mg at 09/24/23 1006   senna-docusate (Senokot-S) tablet 1 tablet  1 tablet Oral QHS PRN Briscoe Deutscher, MD   1 tablet at 09/23/23 0936   sodium chloride flush (NS) 0.9 % injection 3 mL  3 mL Intravenous Q12H Opyd, Lavone Neri, MD   3 mL at 09/24/23 1006     Discharge Medications: Please see discharge summary for a list of discharge medications.  Relevant Imaging Results:  Relevant Lab Results:   Additional Information SSN: [id number]  Anabel Halon, [place]

## 2023-09-24 NOTE — Progress Notes (Signed)
 Spoke with French Ana at [facility] and confirmed med rec and DC instructions with both MD Gwenlyn Perking and [place]. Orders clarified, all questions answered . Pt now ready for DC. Husband, Jonny Ruiz to take pt to [place]. DC packet faxed and sent with patient as well as paper scripts sent via husband John.

## 2023-09-24 NOTE — Discharge Summary (Addendum)
 Physician Discharge Summary   Patient: Diana Smith MRN: [id number] DOB: 11-May-1946  Admit date:     09/23/2023  Discharge date: 09/24/23  Discharge Physician: Vassie Loll   PCP: Mila Palmer, MD   Recommendations at discharge:  Repeat basic metabolic panel to follow electrolytes renal function Reassess blood pressure and adjust medication as needed  Discharge Diagnoses: Principal Problem:   Hypotension Active Problems:   Generalized anxiety disorder   Essential hypertension   Dementia due to Alzheimer's disease   History of ischemic stroke   Acute encephalopathy   Weakness   Confusion  Brief Hospital admission narrative: As per H&P written by Dr. Antionette Char on 09/23/2023 Diana Smith is a 78 y.o. female with medical history significant for Alzheimer dementia, CVA, hypertension, and anxiety who presents with slurred speech and abnormal gait.    Patient was sent from her facility where staff reports that she was noted to have slurred speech and abnormal gait.  Her husband notes that she has not been eating or drinking much recently, was treated with 2 different antibiotics over the past 3 weeks for UTI that she had difficulty clearing, became flushed yesterday and felt warm, but had normal temperature.    Patient denies any pain at this time and specifically denies chest pain, abdominal pain, or headache.   ED Course: Upon arrival to the ED, patient is found to be afebrile and saturating mid 90s on room air with normal heart rate and low blood pressure.  EKG demonstrate sinus rhythm with PACs, RBBB, and LAFB.  There are no acute findings on chest x-ray or head CT.  Labs are most notable for normal renal function, normal WBC, hemoglobin 11.6, and normal lactic acid.   Patient was given a liter of normal saline and oral potassium in the ED.   Assessment and Plan: 1)Hypotension/orthostatic -No fevers, no leukocytosis, chest x-ray unremarkable and UA is not suggestive of  UTI---low clinical index of  suspicion for infection. -Maintain adequate hydration -Continue Diovan at adjusted dose. -Continue treatment with midodrine (5 mg 3 times a day). --Midodrine for pressure support at this time -ACTH stimulation test within normal limits. -No arrhythmia or significant abnormalities appreciated on 2D echo.   2) acute metabolic encephalopathy superimposed on underlying advanced dementia --suspect due to hypotension as above -CT head without acute strokes or other acute findings -MRI brain without contrast demonstrating no acute intracranial normalities. -Husband and daughter updated at bedside.   -She resides at [facility] memory care unit -Continue Lexapro and the use of Seroquel. -Continue adjusted dose of Xanax; as needed.    3)H/o Prior CVA-no new acute focal deficit especially -Hypotension and altered mentation as noted above -CT head without acute strokes or other acute findings -MRI brain without contrast without acute intracranial abnormalities --Continue Crestor and aspirin for secondary stroke prophylaxis   4)GERD--continue PPI.   5) social/ethics--discussed with husband at bedside, questions answered patient is a DNR/DNI  Consultants: None Procedures performed: See below for x-ray reports. Disposition: Assisted living Diet recommendation: Heart healthy diet.  DISCHARGE MEDICATION: Allergies as of 09/24/2023       Reactions   Gatifloxacin Other (See Comments)   Hydrocodone    Keflex [cephalexin] Other (See Comments)   Lorabid [loracarbef]    Minocycline Other (See Comments)        Medication List     STOP taking these medications    cephALEXin 500 MG capsule Commonly known as: KEFLEX   nitrofurantoin (macrocrystal-monohydrate) 100 MG capsule Commonly  known as: MACROBID   PROBIOTIC DAILY PO       TAKE these medications    ALPRAZolam 0.25 MG tablet Commonly known as: XANAX Take 1 tablet (0.25 mg total) by mouth every 8  (eight) hours as needed for anxiety. What changed:  how much to take when to take this reasons to take this additional instructions   aspirin EC 81 MG tablet Take 1 tablet (81 mg total) by mouth daily. Swallow whole.   calcium carbonate 1500 (600 Ca) MG Tabs tablet Commonly known as: OSCAL Take 600 mg of elemental calcium by mouth in the morning and at bedtime.   escitalopram 10 MG tablet Commonly known as: LEXAPRO Take 10 mg by mouth daily.   HAIR SKIN & NAILS PO Take 1 tablet by mouth daily.   Melatonin 12 MG Tabs Take 12 mg by mouth at bedtime.   memantine 10 MG tablet Commonly known as: NAMENDA Take 10 mg by mouth at bedtime.   midodrine 5 MG tablet Commonly known as: PROAMATINE Take 1 tablet (5 mg total) by mouth 3 (three) times daily with meals.   multivitamin with minerals Tabs tablet Take 1 tablet by mouth daily.   omeprazole 40 MG capsule Commonly known as: PRILOSEC Take 40 mg by mouth daily.   QUEtiapine 25 MG tablet Commonly known as: SEROQUEL Take 1 tablet (25 mg total) by mouth 2 (two) times daily. What changed:  when to take this Another medication with the same name was removed. Continue taking this medication, and follow the directions you see here.   rosuvastatin 20 MG tablet Commonly known as: Crestor Take 1 tablet (20 mg total) by mouth daily.   saccharomyces boulardii 250 MG capsule Commonly known as: FLORASTOR Saccharomyces boulardii 250 mg capsule   thiamine 100 MG tablet Commonly known as: Vitamin B-1 Take 1 tablet (100 mg total) by mouth daily.   UNABLE TO FIND Apply 1 Application topically every 6 (six) hours as needed (for anxiety/agitation). Med Name: LORAZEPAM GEL   valsartan 40 MG tablet Commonly known as: DIOVAN Take 0.5 tablets (20 mg total) by mouth daily. What changed: how much to take        Follow-up Information     Mila Palmer, MD. Schedule an appointment as soon as possible for a visit in 10 day(s).    Specialty: Family Medicine Contact information: [address] [place] [address] [place] [address] [phone]                Discharge Exam: Ceasar Mons Weights   09/23/23 0044 09/23/23 0543  Weight: 75.3 kg 77.7 kg   General exam: Alert, awake, able to follow simple commands and in no acute distress.  Hemodynamically stable. Respiratory system: Clear to auscultation.  Good saturation on room air. Cardiovascular system:RRR. No rubs or gallops; no JVD. Gastrointestinal system: Abdomen is nondistended, soft and nontender. No organomegaly or masses felt. Normal bowel sounds heard. Central nervous system:No focal neurological deficits. Extremities: No cyanosis, clubbing or edema. Skin: No petechiae. Psychiatry: Judgement and insight appear impaired secondary to underlying dementia; following commands appropriately.  Condition at discharge: Stable and improved.  The results of significant diagnostics from this hospitalization (including imaging, microbiology, ancillary and laboratory) are listed below for reference.   Imaging Studies: ECHOCARDIOGRAM COMPLETE Result Date: 09/23/2023    ECHOCARDIOGRAM REPORT   Patient Name:   ARRA CONNAUGHTON Date of Exam: 09/23/2023 Medical Rec #:  [id number]        Height:  65.0 in Accession #:    [id number]       Weight:       171.3 lb Date of Birth:  [birth year]        BSA:          1.852 m Patient Age:    77 years         BP:           136/45 mmHg Patient Gender: F                HR:           63 bpm. [facility] Procedure: 2D Echo, Cardiac Doppler and Color Doppler (Both Spectral and Color            Flow Doppler were utilized during procedure). Indications:    Shock R57.9  History:        Patient has prior history of Echocardiogram examinations, most                 recent 12/27/2022. TIA, Arrythmias:RBBB; Risk Factors:Hypertension                 and Dyslipidemia.  Sonographer:    Celesta Gentile RCS Referring Phys: [id number] TIMOTHY  S OPYD IMPRESSIONS  1. Left ventricular ejection fraction, by estimation, is 60 to 65%. The left ventricle has normal function. The left ventricle has no regional wall motion abnormalities. Left ventricular diastolic parameters are consistent with Grade I diastolic dysfunction (impaired relaxation).  2. Right ventricular systolic function is normal. The right ventricular size is normal. Tricuspid regurgitation signal is inadequate for assessing PA pressure.  3. The mitral valve is normal in structure. Trivial mitral valve regurgitation. No evidence of mitral stenosis.  4. The aortic valve is tricuspid. Aortic valve regurgitation is mild. No aortic stenosis is present.  5. The inferior vena cava is dilated in size with >50% respiratory variability, suggesting right atrial pressure of 8 mmHg. Comparison(s): No significant change from prior study. FINDINGS  Left Ventricle: Left ventricular ejection fraction, by estimation, is 60 to 65%. The left ventricle has normal function. The left ventricle has no regional wall motion abnormalities. Strain was performed and the global longitudinal strain is indeterminate. The left ventricular internal cavity size was normal in size. There is no left ventricular hypertrophy. Left ventricular diastolic parameters are consistent with Grade I diastolic dysfunction (impaired relaxation). Normal left ventricular filling pressure. Right Ventricle: The right ventricular size is normal. No increase in right ventricular wall thickness. Right ventricular systolic function is normal. Tricuspid regurgitation signal is inadequate for assessing PA pressure. Left Atrium: Left atrial size was normal in size. Right Atrium: Right atrial size was normal in size. Pericardium: There is no evidence of pericardial effusion. Mitral Valve: The mitral valve is normal in structure. Trivial mitral valve regurgitation. No evidence of mitral valve stenosis. Tricuspid Valve: The tricuspid valve is normal in  structure. Tricuspid valve regurgitation is not demonstrated. No evidence of tricuspid stenosis. Aortic Valve: The aortic valve is tricuspid. Aortic valve regurgitation is mild. No aortic stenosis is present. Aortic valve mean gradient measures 5.0 mmHg. Aortic valve peak gradient measures 12.8 mmHg. Aortic valve area, by VTI measures 2.14 cm. Pulmonic Valve: The pulmonic valve was normal in structure. Pulmonic valve regurgitation is trivial. No evidence of pulmonic stenosis. Aorta: The aortic root is normal in size and structure. Venous: The inferior vena cava is dilated in size with greater than 50% respiratory variability, suggesting right atrial  pressure of 8 mmHg. IAS/Shunts: No atrial level shunt detected by color flow Doppler. Additional Comments: 3D was performed not requiring image post processing on an independent workstation and was indeterminate.  LEFT VENTRICLE PLAX 2D LVIDd:         5.10 cm   Diastology LVIDs:         2.70 cm   LV e' medial:    6.96 cm/s LV PW:         1.00 cm   LV E/e' medial:  10.6 LV IVS:        1.00 cm   LV e' lateral:   10.00 cm/s LVOT diam:     2.00 cm   LV E/e' lateral: 7.4 LV SV:         86 LV SV Index:   47 LVOT Area:     3.14 cm  RIGHT VENTRICLE RV S prime:     12.20 cm/s TAPSE (M-mode): 2.5 cm LEFT ATRIUM           Index        RIGHT ATRIUM           Index LA diam:      3.10 cm 1.67 cm/m   RA Area:     17.20 cm LA Vol (A2C): 51.3 ml 27.70 ml/m  RA Volume:   50.45 ml  27.24 ml/m LA Vol (A4C): 52.7 ml 28.45 ml/m  AORTIC VALVE AV Area (Vmax):    2.47 cm AV Area (Vmean):   2.58 cm AV Area (VTI):     2.14 cm AV Vmax:           179.00 cm/s AV Vmean:          100.600 cm/s AV VTI:            0.404 m AV Peak Grad:      12.8 mmHg AV Mean Grad:      5.0 mmHg LVOT Vmax:         141.00 cm/s LVOT Vmean:        82.700 cm/s LVOT VTI:          0.275 m LVOT/AV VTI ratio: 0.68  AORTA Ao Root diam: 3.50 cm MITRAL VALVE MV Area (PHT): 4.15 cm    SHUNTS MV Decel Time: 183 msec     Systemic VTI:  0.28 m MV E velocity: 74.00 cm/s  Systemic Diam: 2.00 cm MV A velocity: 79.20 cm/s MV E/A ratio:  0.93 Vishnu Priya Mallipeddi Electronically signed by Winfield Rast Mallipeddi Signature Date/Time: 09/23/2023/9:43:52 PM    Final    MR BRAIN WO CONTRAST Result Date: 09/23/2023 CLINICAL DATA:  Mental status change, unknown cause EXAM: MRI HEAD WITHOUT CONTRAST TECHNIQUE: Multiplanar, multiecho pulse sequences of the brain and surrounding structures were obtained without intravenous contrast. COMPARISON:  CT head from earlier today.  MRI head 12/27/2022. FINDINGS: Brain: No acute infarction, hemorrhage, hydrocephalus, extra-axial collection or mass lesion. Cerebral atrophy. Chronic microvascular ischemic disease. Vascular: Major arterial flow voids are maintained. Skull and upper cervical spine: Normal marrow signal. Sinuses/Orbits: Negative. Other: No mastoid effusions. IMPRESSION: No evidence of acute intracranial abnormality. Electronically Signed   By: Feliberto Harts M.D.   On: 09/23/2023 19:32   CT Head Wo Contrast Result Date: 09/23/2023 CLINICAL DATA:  Altered mental status and slurred speech, initial encounter EXAM: CT HEAD WITHOUT CONTRAST TECHNIQUE: Contiguous axial images were obtained from the base of the skull through the vertex without intravenous contrast. RADIATION DOSE REDUCTION: This exam was  performed according to the departmental dose-optimization program which includes automated exposure control, adjustment of the mA and/or kV according to patient size and/or use of iterative reconstruction technique. COMPARISON:  12/26/2022 FINDINGS: Brain: No evidence of acute infarction, hemorrhage, hydrocephalus, extra-axial collection or mass lesion/mass effect. Chronic atrophic changes are noted. Vascular: No hyperdense vessel or unexpected calcification. Skull: Normal. Negative for fracture or focal lesion. Sinuses/Orbits: No acute finding. Other: None. IMPRESSION: Chronic atrophic  changes without acute abnormality. Electronically Signed   By: Alcide Clever M.D.   On: 09/23/2023 02:38   DG Chest Port 1 View Result Date: 09/23/2023 CLINICAL DATA:  Weakness.  Altered mental status. EXAM: PORTABLE CHEST 1 VIEW COMPARISON:  06/29/2023 FINDINGS: Cardiac enlargement. No vascular congestion, edema, or consolidation. No pleural effusion or pneumothorax. Mediastinal contours appear intact. Calcified and tortuous aorta. Degenerative changes in the spine and shoulders. IMPRESSION: Cardiac enlargement.  No evidence of active pulmonary disease. Electronically Signed   By: Burman Nieves M.D.   On: 09/23/2023 01:01    Microbiology: Results for orders placed or performed during the hospital encounter of 09/23/23  Culture, blood (Routine X 2) w Reflex to ID Panel     Status: None (Preliminary result)   Collection Time: 09/23/23  5:15 AM   Specimen: BLOOD  Result Value Ref Range Status   Specimen Description BLOOD BLOOD RIGHT HAND  Final   Special Requests   Final    BOTTLES DRAWN AEROBIC AND ANAEROBIC Blood Culture adequate volume   Culture   Final    NO GROWTH 1 DAY Performed at [facility], [address], [place], [place] [address]    Report Status PENDING  Incomplete  Culture, blood (Routine X 2) w Reflex to ID Panel     Status: None (Preliminary result)   Collection Time: 09/23/23  5:18 AM   Specimen: BLOOD  Result Value Ref Range Status   Specimen Description BLOOD BLOOD RIGHT ARM  Final   Special Requests   Final    BOTTLES DRAWN AEROBIC AND ANAEROBIC Blood Culture adequate volume   Culture   Final    NO GROWTH 1 DAY Performed at [facility], [address] [place], [place] [address]    Report Status PENDING  Incomplete  MRSA Next Gen by PCR, Nasal     Status: None   Collection Time: 09/23/23  6:14 AM   Specimen: Nasal Mucosa; Nasal Swab  Result Value Ref Range Status   MRSA by PCR Next Gen NOT DETECTED NOT DETECTED Final    Comment: (NOTE) The GeneXpert MRSA  Assay (FDA approved for NASAL specimens only), is one component of a comprehensive MRSA colonization surveillance program. It is not intended to diagnose MRSA infection nor to guide or monitor treatment for MRSA infections. Test performance is not FDA approved in patients less than 33 years old. Performed at [facility], [address], [place], [place] [address]     Labs: CBC: Recent Labs  Lab 09/23/23 0035 09/23/23 0515 09/24/23 0452  WBC 8.4 6.0 5.1  NEUTROABS 6.3  --   --   HGB 11.6* 10.2* 9.5*  HCT 35.3* 31.5* 29.6*  MCV 96.4 98.1 97.7  PLT 169 152 153   Basic Metabolic Panel: Recent Labs  Lab 09/23/23 0035 09/23/23 0515 09/24/23 0452  NA 135 138 141  K 3.4* 3.7 3.7  CL 104 111 115*  CO2 24 23 21*  GLUCOSE 140* 109* 123*  BUN 14 13 10   CREATININE 0.84 0.75 0.65  CALCIUM  8.6* 8.1* 8.3*  MG 2.0  --   --   PHOS 2.2*  --   --    Liver Function Tests: Recent Labs  Lab 09/23/23 0035  AST 47*  ALT 34  ALKPHOS 100  BILITOT 0.8  PROT 6.4*  ALBUMIN 3.2*   CBG: Recent Labs  Lab 09/23/23 0036  GLUCAP 134*    Discharge time spent: greater than 30 minutes.  Signed: Vassie Loll, MD [facility] 09/24/2023

## 2023-09-24 NOTE — [place] FL2 (Signed)
 [place] MEDICAID FL2 LEVEL OF CARE FORM     IDENTIFICATION  Patient Name: Diana Smith Birthdate: [birth year] Sex: female Admission Date (Current Location): 09/23/2023  [facility] and [place] Number:  [employer] and Address:  [facility],  [address] [place] [address]      Provider Number: [id number]  Attending Physician Name and Address:  Vassie Loll, MD  Relative Name and Phone Number:  Malaysha Arlen [phone]    Current Level of Care: Hospital Recommended Level of Care: Assisted Living Facility, Memory Care Prior Approval Number:    Date Approved/Denied:   PASRR Number:    Discharge Plan: Other (Comment) (ALF Memory Care)    Current Diagnoses: Patient Active Problem List   Diagnosis Date Noted   Weakness 09/24/2023   Confusion 09/24/2023   Hypotension 09/23/2023   History of ischemic stroke 09/23/2023   Acute encephalopathy 09/23/2023   TIA (transient ischemic attack) 12/26/2022   Dementia due to Alzheimer's disease 09/10/2022   Abdominal pain 04/24/2022   Hypoalbuminemia due to protein-calorie malnutrition 04/24/2022   Pulmonary nodule 04/24/2022   Essential hypertension 04/24/2022   Insomnia 04/24/2022   Actinic keratosis    Allergic rhinitis due to pollen    Cerebrovascular disease    History of COVID-19    Gastro-esophageal reflux disease without esophagitis    Generalized anxiety disorder    Hyperglycemia    Hyperlipidemia    Major depressive disorder    Nonalcoholic steatohepatitis (NASH)    Overactive bladder    History of colonic polyps    Prediabetes    Right bundle branch block    Rosacea    Sciatica, right side    Slow transit constipation    Vitamin B12 deficiency (non anemic)    Vitamin D deficiency    Osteoarthritis of right knee 01/25/2021    Orientation RESPIRATION BLADDER Height & Weight     Self  Normal Incontinent Weight: 77.7 kg Height:  5\' 5"  (165.1 cm)  BEHAVIORAL SYMPTOMS/MOOD  NEUROLOGICAL BOWEL NUTRITION STATUS      Continent Diet (Regular)  AMBULATORY STATUS COMMUNICATION OF NEEDS Skin   Independent Verbally Normal                       Personal Care Assistance Level of Assistance  Bathing, Feeding, Dressing Bathing Assistance: Limited assistance Feeding assistance: Independent Dressing Assistance: Limited assistance     Functional Limitations Info  Sight, Hearing, Speech Sight Info: Impaired Hearing Info: Adequate Speech Info: Adequate    SPECIAL CARE FACTORS FREQUENCY                       Contractures Contractures Info: Not present    Additional Factors Info  Code Status, Allergies Code Status Info: FULL Allergies Info: Dynancin (minocycline Hcl), Gatifloxacin, Keflex (cephalexin), Lorabid (loracarbef), and Minocycline           Current Medications (09/24/2023):  This is the current hospital active medication list Current Facility-Administered Medications  Medication Dose Route Frequency Provider Last Rate Last Admin   acetaminophen (TYLENOL) tablet 650 mg  650 mg Oral Q6H PRN Opyd, Lavone Neri, MD       Or   acetaminophen (TYLENOL) suppository 650 mg  650 mg Rectal Q6H PRN Opyd, Lavone Neri, MD       ALPRAZolam Prudy Feeler) tablet 0.5 mg  0.5 mg Oral BID PRN Shon Hale, MD       aspirin EC tablet 81  mg  81 mg Oral Daily Opyd, Lavone Neri, MD   81 mg at 09/24/23 1005   Chlorhexidine Gluconate Cloth 2 % PADS 6 each  6 each Topical Daily Opyd, Lavone Neri, MD   6 each at 09/24/23 1006   enoxaparin (LOVENOX) injection 40 mg  40 mg Subcutaneous Q24H Opyd, Lavone Neri, MD   40 mg at 09/24/23 1006   escitalopram (LEXAPRO) tablet 10 mg  10 mg Oral Daily Opyd, Lavone Neri, MD   10 mg at 09/24/23 1004   lactated ringers infusion   Intravenous Continuous Emokpae, Courage, MD 150 mL/hr at 09/24/23 0538 New Bag at 09/24/23 0538   midodrine (PROAMATINE) tablet 5 mg  5 mg Oral TID WC Emokpae, Courage, MD   5 mg at 09/23/23 1142   ondansetron (ZOFRAN)  tablet 4 mg  4 mg Oral Q6H PRN Opyd, Lavone Neri, MD       Or   ondansetron (ZOFRAN) injection 4 mg  4 mg Intravenous Q6H PRN Opyd, Lavone Neri, MD       pantoprazole (PROTONIX) EC tablet 40 mg  40 mg Oral Daily Opyd, Lavone Neri, MD   40 mg at 09/24/23 1005   rosuvastatin (CRESTOR) tablet 20 mg  20 mg Oral Daily Opyd, Lavone Neri, MD   20 mg at 09/24/23 1006   senna-docusate (Senokot-S) tablet 1 tablet  1 tablet Oral QHS PRN Briscoe Deutscher, MD   1 tablet at 09/23/23 0936   sodium chloride flush (NS) 0.9 % injection 3 mL  3 mL Intravenous Q12H Opyd, Lavone Neri, MD   3 mL at 09/24/23 1006     Discharge Medications: Please see discharge summary for a list of discharge medications.  Relevant Imaging Results:  Relevant Lab Results:   Additional Information SSN: [id number]  Anabel Halon, [place]

## 2023-09-26 DIAGNOSIS — F411 Generalized anxiety disorder: Secondary | ICD-10-CM | POA: Diagnosis not present

## 2023-09-26 DIAGNOSIS — I10 Essential (primary) hypertension: Secondary | ICD-10-CM | POA: Diagnosis not present

## 2023-09-26 DIAGNOSIS — B372 Candidiasis of skin and nail: Secondary | ICD-10-CM | POA: Diagnosis not present

## 2023-09-26 DIAGNOSIS — I951 Orthostatic hypotension: Secondary | ICD-10-CM | POA: Diagnosis not present

## 2023-09-26 NOTE — Progress Notes (Signed)
 Note entered 3/31: CSW updated by French Ana with [facility] that Fl2 completed over the weekend did not have the D/C med list attached. CSW completed new Fl2 and had MD sign. New signed Fl2 faxed to facility.

## 2023-09-26 NOTE — [place] FL2 (Signed)
 [place] MEDICAID FL2 LEVEL OF CARE FORM     IDENTIFICATION  Patient Name: Diana Smith Birthdate: April 23, 1946 Sex: female Admission Date (Current Location): 09/23/2023  [facility] and [place] Number:  [employer] and Address:  [facility],  [address] [place] [address]      Provider Number: [id number]  Attending Physician Name and Address:  No att. providers found  Relative Name and Phone Number:  Leena Tiede [phone]    Current Level of Care: Hospital Recommended Level of Care: Assisted Living Facility, Memory Care Prior Approval Number:    Date Approved/Denied:   PASRR Number:    Discharge Plan: Other (Comment) (ALF Memory Care)    Current Diagnoses: Patient Active Problem List   Diagnosis Date Noted   Weakness 09/24/2023   Confusion 09/24/2023   Hypotension 09/23/2023   History of ischemic stroke 09/23/2023   Acute encephalopathy 09/23/2023   TIA (transient ischemic attack) 12/26/2022   Dementia due to Alzheimer's disease 09/10/2022   Abdominal pain 04/24/2022   Hypoalbuminemia due to protein-calorie malnutrition 04/24/2022   Pulmonary nodule 04/24/2022   Essential hypertension 04/24/2022   Insomnia 04/24/2022   Actinic keratosis    Allergic rhinitis due to pollen    Cerebrovascular disease    History of COVID-19    Gastro-esophageal reflux disease without esophagitis    Generalized anxiety disorder    Hyperglycemia    Hyperlipidemia    Major depressive disorder    Nonalcoholic steatohepatitis (NASH)    Overactive bladder    History of colonic polyps    Prediabetes    Right bundle branch block    Rosacea    Sciatica, right side    Slow transit constipation    Vitamin B12 deficiency (non anemic)    Vitamin D deficiency    Osteoarthritis of right knee 01/25/2021    Orientation RESPIRATION BLADDER Height & Weight     Self  Normal Incontinent Weight: 171 lb 4.8 oz (77.7 kg) Height:  5\' 5"  (165.1 cm)   BEHAVIORAL SYMPTOMS/MOOD NEUROLOGICAL BOWEL NUTRITION STATUS      Continent Diet (Regular)  AMBULATORY STATUS COMMUNICATION OF NEEDS Skin   Independent Verbally Normal                       Personal Care Assistance Level of Assistance  Bathing, Feeding, Dressing Bathing Assistance: Limited assistance Feeding assistance: Independent Dressing Assistance: Limited assistance     Functional Limitations Info  Sight, Hearing, Speech Sight Info: Impaired Hearing Info: Adequate Speech Info: Adequate    SPECIAL CARE FACTORS FREQUENCY                       Contractures Contractures Info: Not present    Additional Factors Info  Code Status, Allergies Code Status Info: FULL Allergies Info: Dynancin (minocycline Hcl), Gatifloxacin, Keflex (cephalexin), Lorabid (loracarbef), and Minocycline           Current Medications (09/26/2023):  This is the current hospital active medication list No current facility-administered medications for this encounter.   Current Outpatient Medications  Medication Sig Dispense Refill   aspirin EC 81 MG tablet Take 1 tablet (81 mg total) by mouth daily. Swallow whole. 30 tablet 11   calcium carbonate (OSCAL) 1500 (600 Ca) MG TABS tablet Take 600 mg of elemental calcium by mouth in the morning and at bedtime.     escitalopram (LEXAPRO) 10 MG tablet Take 10 mg by mouth daily.  Melatonin 12 MG TABS Take 12 mg by mouth at bedtime.     memantine (NAMENDA) 10 MG tablet Take 10 mg by mouth at bedtime.     Multiple Vitamin (MULTIVITAMIN WITH MINERALS) TABS tablet Take 1 tablet by mouth daily.     Multiple Vitamins-Minerals (HAIR SKIN & NAILS PO) Take 1 tablet by mouth daily.     omeprazole (PRILOSEC) 40 MG capsule Take 40 mg by mouth daily.     rosuvastatin (CRESTOR) 20 MG tablet Take 1 tablet (20 mg total) by mouth daily. 30 tablet 11   saccharomyces boulardii (FLORASTOR) 250 MG capsule Saccharomyces boulardii 250 mg capsule     thiamine  (VITAMIN B-1) 100 MG tablet Take 1 tablet (100 mg total) by mouth daily. 30 tablet 1   ALPRAZolam (XANAX) 0.25 MG tablet Take 1 tablet (0.25 mg total) by mouth every 8 (eight) hours as needed for anxiety. 30 tablet 0   midodrine (PROAMATINE) 5 MG tablet Take 1 tablet (5 mg total) by mouth 3 (three) times daily with meals. 90 tablet 1   QUEtiapine (SEROQUEL) 25 MG tablet Take 1 tablet (25 mg total) by mouth 2 (two) times daily. 60 tablet 0   UNABLE TO FIND Apply 1 Application topically every 6 (six) hours as needed (for anxiety/agitation). Med Name: LORAZEPAM GEL; not a new prescription. (Patient was already on this medication at her ALF) 1 each 0   valsartan (DIOVAN) 40 MG tablet Take 0.5 tablets (20 mg total) by mouth daily. 30 tablet 1     Discharge Medications: Allergies as of 09/24/2023       Reactions   Gatifloxacin Other (See Comments)   Hydrocodone    Keflex [cephalexin] Other (See Comments)   Lorabid [loracarbef]    Minocycline Other (See Comments)        Medication List     STOP taking these medications    cephALEXin 500 MG capsule Commonly known as: KEFLEX   nitrofurantoin (macrocrystal-monohydrate) 100 MG capsule Commonly known as: MACROBID   PROBIOTIC DAILY PO       TAKE these medications    ALPRAZolam 0.25 MG tablet Commonly known as: XANAX Take 1 tablet (0.25 mg total) by mouth every 8 (eight) hours as needed for anxiety. What changed:  how much to take when to take this reasons to take this additional instructions   aspirin EC 81 MG tablet Take 1 tablet (81 mg total) by mouth daily. Swallow whole.   calcium carbonate 1500 (600 Ca) MG Tabs tablet Commonly known as: OSCAL Take 600 mg of elemental calcium by mouth in the morning and at bedtime.   escitalopram 10 MG tablet Commonly known as: LEXAPRO Take 10 mg by mouth daily.   HAIR SKIN & NAILS PO Take 1 tablet by mouth daily.   Melatonin 12 MG Tabs Take 12 mg by mouth at bedtime.    memantine 10 MG tablet Commonly known as: NAMENDA Take 10 mg by mouth at bedtime.   midodrine 5 MG tablet Commonly known as: PROAMATINE Take 1 tablet (5 mg total) by mouth 3 (three) times daily with meals.   multivitamin with minerals Tabs tablet Take 1 tablet by mouth daily.   omeprazole 40 MG capsule Commonly known as: PRILOSEC Take 40 mg by mouth daily.   QUEtiapine 25 MG tablet Commonly known as: SEROQUEL Take 1 tablet (25 mg total) by mouth 2 (two) times daily. What changed:  when to take this Another medication with the same name was removed. Continue  taking this medication, and follow the directions you see here.   rosuvastatin 20 MG tablet Commonly known as: Crestor Take 1 tablet (20 mg total) by mouth daily.   saccharomyces boulardii 250 MG capsule Commonly known as: FLORASTOR Saccharomyces boulardii 250 mg capsule   thiamine 100 MG tablet Commonly known as: Vitamin B-1 Take 1 tablet (100 mg total) by mouth daily.   UNABLE TO FIND Apply 1 Application topically every 6 (six) hours as needed (for anxiety/agitation). Med Name: LORAZEPAM GEL; not a new prescription. (Patient was already on this medication at her ALF) What changed: additional instructions   valsartan 40 MG tablet Commonly known as: DIOVAN Take 0.5 tablets (20 mg total) by mouth daily. What changed: how much to take         Relevant Imaging Results:  Relevant Lab Results:   Additional Information SSN: [id number] [place]

## 2023-09-28 LAB — CULTURE, BLOOD (ROUTINE X 2)
Culture: NO GROWTH
Culture: NO GROWTH
Special Requests: ADEQUATE
Special Requests: ADEQUATE

## 2023-10-03 DIAGNOSIS — R600 Localized edema: Secondary | ICD-10-CM | POA: Diagnosis not present

## 2023-10-03 DIAGNOSIS — I951 Orthostatic hypotension: Secondary | ICD-10-CM | POA: Diagnosis not present

## 2023-10-03 DIAGNOSIS — R82998 Other abnormal findings in urine: Secondary | ICD-10-CM | POA: Diagnosis not present

## 2023-10-04 ENCOUNTER — Ambulatory Visit: Payer: Medicare HMO

## 2023-10-04 DIAGNOSIS — N390 Urinary tract infection, site not specified: Secondary | ICD-10-CM | POA: Diagnosis not present

## 2023-10-10 DIAGNOSIS — R600 Localized edema: Secondary | ICD-10-CM | POA: Diagnosis not present

## 2023-10-10 DIAGNOSIS — N390 Urinary tract infection, site not specified: Secondary | ICD-10-CM | POA: Diagnosis not present

## 2023-10-11 DIAGNOSIS — F0393 Unspecified dementia, unspecified severity, with mood disturbance: Secondary | ICD-10-CM | POA: Diagnosis not present

## 2023-10-11 DIAGNOSIS — F5101 Primary insomnia: Secondary | ICD-10-CM | POA: Diagnosis not present

## 2023-10-11 DIAGNOSIS — Z8673 Personal history of transient ischemic attack (TIA), and cerebral infarction without residual deficits: Secondary | ICD-10-CM | POA: Diagnosis not present

## 2023-10-11 DIAGNOSIS — F411 Generalized anxiety disorder: Secondary | ICD-10-CM | POA: Diagnosis not present

## 2023-10-11 DIAGNOSIS — F0280 Dementia in other diseases classified elsewhere without behavioral disturbance: Secondary | ICD-10-CM | POA: Diagnosis not present

## 2023-10-14 DIAGNOSIS — K219 Gastro-esophageal reflux disease without esophagitis: Secondary | ICD-10-CM | POA: Diagnosis not present

## 2023-10-14 DIAGNOSIS — E119 Type 2 diabetes mellitus without complications: Secondary | ICD-10-CM | POA: Diagnosis not present

## 2023-10-14 DIAGNOSIS — F0393 Unspecified dementia, unspecified severity, with mood disturbance: Secondary | ICD-10-CM | POA: Diagnosis not present

## 2023-10-17 DIAGNOSIS — E782 Mixed hyperlipidemia: Secondary | ICD-10-CM | POA: Diagnosis not present

## 2023-10-17 DIAGNOSIS — K219 Gastro-esophageal reflux disease without esophagitis: Secondary | ICD-10-CM | POA: Diagnosis not present

## 2023-10-17 DIAGNOSIS — I10 Essential (primary) hypertension: Secondary | ICD-10-CM | POA: Diagnosis not present

## 2023-10-17 DIAGNOSIS — F0280 Dementia in other diseases classified elsewhere without behavioral disturbance: Secondary | ICD-10-CM | POA: Diagnosis not present

## 2023-10-24 DIAGNOSIS — H43813 Vitreous degeneration, bilateral: Secondary | ICD-10-CM | POA: Diagnosis not present

## 2023-10-25 DIAGNOSIS — E782 Mixed hyperlipidemia: Secondary | ICD-10-CM | POA: Diagnosis not present

## 2023-10-25 DIAGNOSIS — I10 Essential (primary) hypertension: Secondary | ICD-10-CM | POA: Diagnosis not present

## 2023-10-25 DIAGNOSIS — F4323 Adjustment disorder with mixed anxiety and depressed mood: Secondary | ICD-10-CM | POA: Diagnosis not present

## 2023-10-25 DIAGNOSIS — F0390 Unspecified dementia without behavioral disturbance: Secondary | ICD-10-CM | POA: Diagnosis not present

## 2023-10-25 DIAGNOSIS — F411 Generalized anxiety disorder: Secondary | ICD-10-CM | POA: Diagnosis not present

## 2023-10-27 DIAGNOSIS — H524 Presbyopia: Secondary | ICD-10-CM | POA: Diagnosis not present

## 2023-10-27 DIAGNOSIS — H52221 Regular astigmatism, right eye: Secondary | ICD-10-CM | POA: Diagnosis not present

## 2023-11-07 DIAGNOSIS — F5101 Primary insomnia: Secondary | ICD-10-CM | POA: Diagnosis not present

## 2023-11-07 DIAGNOSIS — F411 Generalized anxiety disorder: Secondary | ICD-10-CM | POA: Diagnosis not present

## 2023-11-07 DIAGNOSIS — F0280 Dementia in other diseases classified elsewhere without behavioral disturbance: Secondary | ICD-10-CM | POA: Diagnosis not present

## 2023-11-07 DIAGNOSIS — F0393 Unspecified dementia, unspecified severity, with mood disturbance: Secondary | ICD-10-CM | POA: Diagnosis not present

## 2023-11-09 DIAGNOSIS — N3020 Other chronic cystitis without hematuria: Secondary | ICD-10-CM | POA: Diagnosis not present

## 2023-11-09 DIAGNOSIS — R350 Frequency of micturition: Secondary | ICD-10-CM | POA: Diagnosis not present

## 2023-11-09 DIAGNOSIS — R3915 Urgency of urination: Secondary | ICD-10-CM | POA: Diagnosis not present

## 2023-11-14 DIAGNOSIS — K219 Gastro-esophageal reflux disease without esophagitis: Secondary | ICD-10-CM | POA: Diagnosis not present

## 2023-11-14 DIAGNOSIS — I10 Essential (primary) hypertension: Secondary | ICD-10-CM | POA: Diagnosis not present

## 2023-11-14 DIAGNOSIS — R600 Localized edema: Secondary | ICD-10-CM | POA: Diagnosis not present

## 2023-11-17 DIAGNOSIS — E782 Mixed hyperlipidemia: Secondary | ICD-10-CM | POA: Diagnosis not present

## 2023-11-17 DIAGNOSIS — I10 Essential (primary) hypertension: Secondary | ICD-10-CM | POA: Diagnosis not present

## 2023-12-06 ENCOUNTER — Ambulatory Visit
Admission: EM | Admit: 2023-12-06 | Discharge: 2023-12-06 | Disposition: A | Discharge status: Other Acute Inpatient Hospital | Attending: Family Medicine | Admitting: Family Medicine

## 2023-12-06 ENCOUNTER — Emergency Department (HOSPITAL_COMMUNITY)

## 2023-12-06 ENCOUNTER — Other Ambulatory Visit: Payer: Self-pay

## 2023-12-06 ENCOUNTER — Encounter (HOSPITAL_COMMUNITY): Payer: Self-pay

## 2023-12-06 ENCOUNTER — Encounter: Payer: Self-pay | Admitting: Emergency Medicine

## 2023-12-06 ENCOUNTER — Emergency Department (HOSPITAL_COMMUNITY)
Admission: EM | Admit: 2023-12-06 | Discharge: 2023-12-06 | Disposition: A | Discharge status: Home / Self Care | Attending: Emergency Medicine | Admitting: Emergency Medicine

## 2023-12-06 DIAGNOSIS — F0390 Unspecified dementia without behavioral disturbance: Secondary | ICD-10-CM | POA: Insufficient documentation

## 2023-12-06 DIAGNOSIS — I517 Cardiomegaly: Secondary | ICD-10-CM | POA: Diagnosis not present

## 2023-12-06 DIAGNOSIS — I959 Hypotension, unspecified: Secondary | ICD-10-CM

## 2023-12-06 DIAGNOSIS — R42 Dizziness and giddiness: Secondary | ICD-10-CM

## 2023-12-06 DIAGNOSIS — E860 Dehydration: Secondary | ICD-10-CM | POA: Insufficient documentation

## 2023-12-06 DIAGNOSIS — R4182 Altered mental status, unspecified: Secondary | ICD-10-CM

## 2023-12-06 DIAGNOSIS — R531 Weakness: Secondary | ICD-10-CM | POA: Diagnosis not present

## 2023-12-06 DIAGNOSIS — I771 Stricture of artery: Secondary | ICD-10-CM | POA: Diagnosis not present

## 2023-12-06 DIAGNOSIS — I951 Orthostatic hypotension: Secondary | ICD-10-CM | POA: Insufficient documentation

## 2023-12-06 DIAGNOSIS — Z7982 Long term (current) use of aspirin: Secondary | ICD-10-CM | POA: Insufficient documentation

## 2023-12-06 DIAGNOSIS — I700 Atherosclerosis of aorta: Secondary | ICD-10-CM | POA: Diagnosis not present

## 2023-12-06 DIAGNOSIS — R001 Bradycardia, unspecified: Secondary | ICD-10-CM | POA: Diagnosis not present

## 2023-12-06 DIAGNOSIS — I10 Essential (primary) hypertension: Secondary | ICD-10-CM | POA: Diagnosis not present

## 2023-12-06 LAB — TROPONIN I (HIGH SENSITIVITY)
Troponin I (High Sensitivity): 3 ng/L (ref <18)
Troponin I (High Sensitivity): 3 ng/L (ref <18)

## 2023-12-06 LAB — URINALYSIS, ROUTINE W REFLEX MICROSCOPIC
Bilirubin Urine: NEGATIVE
Glucose, UA: NEGATIVE mg/dL
Hgb urine dipstick: NEGATIVE
Ketones, ur: NEGATIVE mg/dL
Leukocytes,Ua: NEGATIVE
Nitrite: NEGATIVE
Protein, ur: NEGATIVE mg/dL
Specific Gravity, Urine: 1.005 (ref 1.005–1.030)
pH: 8 (ref 5.0–8.0)

## 2023-12-06 LAB — BASIC METABOLIC PANEL WITH GFR
Anion gap: 11 (ref 5–15)
BUN: 26 mg/dL — ABNORMAL HIGH (ref 8–23)
CO2: 23 mmol/L (ref 22–32)
Calcium: 9.6 mg/dL (ref 8.9–10.3)
Chloride: 104 mmol/L (ref 98–111)
Creatinine, Ser: 1.02 mg/dL — ABNORMAL HIGH (ref 0.44–1.00)
GFR, Estimated: 57 mL/min — ABNORMAL LOW (ref >60)
Glucose, Bld: 93 mg/dL (ref 70–99)
Potassium: 3.8 mmol/L (ref 3.5–5.1)
Sodium: 138 mmol/L (ref 135–145)

## 2023-12-06 LAB — CBC
HCT: 36.7 % (ref 36.0–46.0)
Hemoglobin: 12.6 g/dL (ref 12.0–15.0)
MCH: 33 pg (ref 26.0–34.0)
MCHC: 34.3 g/dL (ref 30.0–36.0)
MCV: 96.1 fL (ref 80.0–100.0)
Platelets: 224 10*3/uL (ref 150–400)
RBC: 3.82 MIL/uL — ABNORMAL LOW (ref 3.87–5.11)
RDW: 13.7 % (ref 11.5–15.5)
WBC: 8.5 10*3/uL (ref 4.0–10.5)
nRBC: 0 % (ref 0.0–0.2)

## 2023-12-06 LAB — LACTIC ACID, PLASMA
Lactic Acid, Venous: 1.5 mmol/L (ref 0.5–1.9)
Lactic Acid, Venous: 1.1 mmol/L (ref 0.5–1.9)

## 2023-12-06 LAB — POCT FASTING CBG KUC MANUAL ENTRY: POCT Glucose (KUC): 156 mg/dL — AB (ref 70–99)

## 2023-12-06 MED ORDER — MIDODRINE HCL 5 MG PO TABS: 5.0000 mg | ORAL_TABLET | Freq: Three times a day (TID) | ORAL | 0 refills | Status: DC

## 2023-12-06 MED ORDER — SODIUM CHLORIDE 0.9 % IV BOLUS
1000.0000 mL | Freq: Once | INTRAVENOUS | Status: AC
  Administered 2023-12-06: 1000 mL via INTRAVENOUS

## 2023-12-06 MED ORDER — MIDODRINE HCL 5 MG PO TABS
5.0000 mg | ORAL_TABLET | Freq: Once | ORAL | Status: AC
  Administered 2023-12-06: 5 mg via ORAL
  Filled 2023-12-06: qty 1

## 2023-12-06 NOTE — ED Triage Notes (Signed)
 Pt family reports pt has been treated for several UTI recently and was discharged from [facility] yesterday. Pt spouse reports pt has complained of dizziness, weakness, and decreased appetite since yesterday.

## 2023-12-06 NOTE — ED Notes (Addendum)
 Patient is being discharged from the Urgent Care and sent to the Emergency Department via POV. Per PA, patient is in need of higher level of care due to dizziness,altered mental status,abnormal EKG/Vitals. Patient is aware and verbalizes understanding of plan of care.  Vitals:    12/06/23 1247  BP: (!) 95/55  Pulse: (!) 55  Resp: 20  Temp:   SpO2: 98%   Pt spouse declined EMS.

## 2023-12-06 NOTE — ED Provider Notes (Signed)
  EMERGENCY DEPARTMENT AT [facility] Provider Note   CSN: [id number] Arrival date & time: 12/06/23  1302     History  Chief Complaint  Patient presents with   Bradycardia    Diana Smith is a 78 y.o. female.  Pt is a 78 yo female with pmhx significant for cva, gerd, nash, arthritis, anxiety, hld, depression, and dementia.  Pt's husband brings pt here due to decreased appetite for the past week.  They initially went to UC and were told to come here for further eval.  She was admitted in March for similar problems with hypotension and orthostasis.  She was d/c with midodrine , but was also d/c with diovan .  I am not sure why she has both.    Pt was signed out of the facility yesterday and husband does not recall if she's taking the midodrine .        Home Medications Prior to Admission medications   Medication Sig Start Date End Date Taking? Authorizing Provider  ALPRAZolam  (XANAX ) 0.25 MG tablet Take 1 tablet (0.25 mg total) by mouth every 8 (eight) hours as needed for anxiety. 09/24/23  Yes Justina Oman, MD  aspirin  EC 81 MG tablet Take 1 tablet (81 mg total) by mouth daily. Swallow whole. 12/28/22  Yes Emokpae, Courage, MD  ibuprofen (ADVIL) 600 MG tablet Take 600 mg by mouth every 4 (four) hours as needed for mild pain (pain score 1-3). 11/30/23  Yes [provider]  calcium  carbonate (OSCAL) 1500 (600 Ca) MG TABS tablet Take 600 mg of elemental calcium  by mouth in the morning and at bedtime.    [provider]  cephALEXin  (KEFLEX ) 250 MG capsule Take 250 mg by mouth daily. 11/15/23   [provider]  escitalopram  (LEXAPRO ) 10 MG tablet Take 10 mg by mouth daily.    [provider]  estradiol (ESTRACE) 0.1 MG/GM vaginal cream Place 1 Applicatorful vaginally once a week. 11/09/23   [provider]  famotidine  (PEPCID ) 20 MG tablet Take 20 mg by mouth at bedtime. 11/19/23   [provider]  Melatonin 12 MG  TABS Take 12 mg by mouth at bedtime.    [provider]  memantine  (NAMENDA ) 10 MG tablet Take 10 mg by mouth at bedtime. 08/06/22   [provider]  midodrine  (PROAMATINE ) 5 MG tablet Take 1 tablet (5 mg total) by mouth 3 (three) times daily with meals. 12/06/23   Melisssa Donner, MD  Multiple Vitamin (MULTIVITAMIN WITH MINERALS) TABS tablet Take 1 tablet by mouth daily.    [provider]  Multiple Vitamins-Minerals (HAIR SKIN & NAILS PO) Take 1 tablet by mouth daily.    [provider]  nystatin cream (MYCOSTATIN) Apply 1 Application topically 2 (two) times daily. 11/20/23   [provider]  omeprazole  (PRILOSEC) 40 MG capsule Take 40 mg by mouth daily.    [provider]  potassium chloride  SA (KLOR-CON  M) 20 MEQ tablet Take 20 mEq by mouth daily. 11/10/23   [provider]  QUEtiapine  (SEROQUEL ) 25 MG tablet Take 1 tablet (25 mg total) by mouth 2 (two) times daily. 09/24/23   Justina Oman, MD  rosuvastatin  (CRESTOR ) 20 MG tablet Take 1 tablet (20 mg total) by mouth daily. 12/27/22 12/27/23  Colin Dawley, MD  saccharomyces boulardii (FLORASTOR) 250 MG capsule Saccharomyces boulardii 250 mg capsule 09/14/23   [provider]  thiamine  (VITAMIN B-1) 100 MG tablet Take 1 tablet (100 mg total) by mouth daily.  04/27/22   Justina Oman, MD  UNABLE TO FIND Apply 1 Application topically every 6 (six) hours as needed (for anxiety/agitation). Med Name: LORAZEPAM  GEL; not a new prescription. (Patient was already on this medication at her ALF) 09/24/23   Justina Oman, MD      Allergies    Gatifloxacin, Hydrocodone, Keflex  [cephalexin ], Lorabid [loracarbef], and Minocycline    Review of Systems   Review of Systems  Neurological:  Positive for weakness.  All other systems reviewed and are negative.   Physical Exam Updated Vital Signs BP (!) 155/60   Pulse (!) 51   Temp 98.1 F (36.7 C)   Resp 14   Ht 5\' 5"  (1.651 m)   Wt 77.6  kg   SpO2 92%   BMI 28.46 kg/m  Physical Exam Vitals and nursing note reviewed.  Constitutional:      Appearance: Normal appearance.  HENT:     Head: Normocephalic and atraumatic.     Right Ear: External ear normal.     Left Ear: External ear normal.     Nose: Nose normal.     Mouth/Throat:     Mouth: Mucous membranes are dry.  Eyes:     Extraocular Movements: Extraocular movements intact.     Conjunctiva/sclera: Conjunctivae normal.     Pupils: Pupils are equal, round, and reactive to light.  Cardiovascular:     Rate and Rhythm: Bradycardia present.     Pulses: Normal pulses.     Heart sounds: Normal heart sounds.  Pulmonary:     Effort: Pulmonary effort is normal.     Breath sounds: Normal breath sounds.  Abdominal:     General: Abdomen is flat. Bowel sounds are normal.     Palpations: Abdomen is soft.  Musculoskeletal:        General: Normal range of motion.     Cervical back: Normal range of motion and neck supple.  Skin:    General: Skin is warm.     Capillary Refill: Capillary refill takes less than 2 seconds.  Neurological:     General: No focal deficit present.     Mental Status: She is alert. Mental status is at baseline.  Psychiatric:        Mood and Affect: Mood normal.        Behavior: Behavior normal.     ED Results / Procedures / Treatments   Labs (all labs ordered are listed, but only abnormal results are displayed) Labs Reviewed  BASIC METABOLIC PANEL WITH GFR - Abnormal; Notable for the following components:      Result Value   BUN 26 (*)    Creatinine, Ser 1.02 (*)    GFR, Estimated 57 (*)    All other components within normal limits  CBC - Abnormal; Notable for the following components:   RBC 3.82 (*)    All other components within normal limits  URINALYSIS, ROUTINE W REFLEX MICROSCOPIC - Abnormal; Notable for the following components:   Color, Urine STRAW (*)    All other components within normal limits  LACTIC ACID, PLASMA  LACTIC  ACID, PLASMA  TROPONIN I (HIGH SENSITIVITY)  TROPONIN I (HIGH SENSITIVITY)    EKG None  Radiology DG Chest Port 1 View Result Date: 12/06/2023 CLINICAL DATA:  bradycardia EXAM: PORTABLE CHEST - 1 VIEW COMPARISON:  September 23, 2023 FINDINGS: No focal airspace consolidation, pleural effusion, or pneumothorax. Mild cardiomegaly. Tortuous aorta with aortic atherosclerosis. No acute fracture or destructive lesion. Multilevel thoracic osteophytosis. Osteopenia.  Right glenohumeral and AC joint osteoarthritis. IMPRESSION: Unchanged cardiomegaly. Otherwise, no acute cardiopulmonary abnormality. Electronically Signed   By: Rance Burrows M.D.   On: 12/06/2023 14:32    Procedures Procedures    Medications Ordered in ED Medications  sodium chloride  0.9 % bolus 1,000 mL (has no administration in time range)  sodium chloride  0.9 % bolus 1,000 mL (0 mLs Intravenous Stopped 12/06/23 1447)  midodrine  (PROAMATINE ) tablet 5 mg (5 mg Oral Given 12/06/23 1448)    ED Course/ Medical Decision Making/ A&P                                 Medical Decision Making Amount and/or Complexity of Data Reviewed Labs: ordered. Radiology: ordered.  Risk Prescription drug management.   This patient presents to the ED for concern of hypotension, this involves an extensive number of treatment options, and is a complaint that carries with it a high risk of complications and morbidity.  The differential diagnosis includes orthostasis, infection   Co morbidities that complicate the patient evaluation   cva, gerd, nash, arthritis, anxiety, hld, depression, and dementia   Additional history obtained:  Additional history obtained from epic chart review External records from outside source obtained and reviewed including husband   Lab Tests:  I Ordered, and personally interpreted labs.  The pertinent results include:  cbc nl, bmp with mild elevation in cr up to 1.02, trop nl; ua nl, lactic nl   Imaging  Studies ordered:  I ordered imaging studies including cxr  I independently visualized and interpreted imaging which showed  Unchanged cardiomegaly. Otherwise, no acute cardiopulmonary  abnormality.   I agree with the radiologist interpretation   Cardiac Monitoring:  The patient was maintained on a cardiac monitor.  I personally viewed and interpreted the cardiac monitored which showed an underlying rhythm of: sb   Medicines ordered and prescription drug management:  I ordered medication including ivfs/midodrine   for sx  Reevaluation of the patient after these medicines showed that the patient improved I have reviewed the patients home medicines and have made adjustments as needed   Test Considered:  ct   Critical Interventions:  ivfs   Problem List / ED Course:  Orthostatic hypotension:   pt looks much better after fluids.  I am going to hold her diovan  and lasix and have her take midodrine .  Pt has no evidence of infection.  She needs to f/u with pcp to get bp rechecked.  Return if worse.    Reevaluation:  After the interventions noted above, I reevaluated the patient and found that they have :improved   Social Determinants of Health:  Lives at home   Dispostion:  After consideration of the diagnostic results and the patients response to treatment, I feel that the patent would benefit from discharge with outpatient f/u.          Final Clinical Impression(s) / ED Diagnoses Final diagnoses:  Dehydration  Orthostatic hypotension    Rx / DC Orders ED Discharge Orders          Ordered    midodrine  (PROAMATINE ) 5 MG tablet  3 times daily with meals        12/06/23 1553              Sueellen Emery, MD 12/06/23 1554

## 2023-12-06 NOTE — ED Provider Notes (Signed)
 [facility]    CSN: [id number] Arrival date & time: 12/06/23  1205      History   Chief Complaint Chief Complaint  Patient presents with   Dizziness    HPI Diana Smith is a 78 y.o. female.   Patient presenting today with 1 day history of dizziness, weakness, decreased appetite, worsening mental status changes.  Husband provides history today as she has dementia.  He states she was just released from [facility] yesterday, has been treated for multiple UTIs recently and had a hospital admission back in March for similar symptoms to today.  States her blood pressure has been running very low and she has not eaten much over the past week.  They deny notice of fever, cough, chest pain, shortness of breath, vomiting, diarrhea, rashes.  Past medical history significant for history of CVA, dementia, hypertension, GERD, anxiety, hyperlipidemia, history of frequent UTIs.    Past Medical History:  Diagnosis Date   Abdominal pain 04/24/2022   Actinic keratosis    Allergic rhinitis due to pollen    Cerebrovascular disease    Dementia due to Alzheimer's disease 09/10/2022   Essential hypertension 04/24/2022   Frequent urinary tract infections    Gastro-esophageal reflux disease without esophagitis    Generalized anxiety disorder    Heartburn    Hematochezia    History of COVID-19    Hyperglycemia    Hyperlipidemia    Hypoalbuminemia due to protein-calorie malnutrition 04/24/2022   Insomnia 04/24/2022   Major depressive disorder    Nonalcoholic steatohepatitis (NASH)    Osteoarthritis of left knee 03/21/2022   Osteoarthritis of right knee 01/25/2021   Overactive bladder    Personal history of colonic polyps    Prediabetes    Pulmonary nodule 04/24/2022   Pure hypercholesterolemia    Right bundle branch block    Rosacea    Sciatica, right side    Slow transit constipation    Starvation ketoacidosis 04/24/2022   UTI (urinary tract  infection) 04/24/2022   Vitamin B12 deficiency (non anemic)    Vitamin D  deficiency     Patient Active Problem List   Diagnosis Date Noted   Weakness 09/24/2023   Confusion 09/24/2023   Hypotension 09/23/2023   History of ischemic stroke 09/23/2023   Acute encephalopathy 09/23/2023   TIA (transient ischemic attack) 12/26/2022   Dementia due to Alzheimer's disease 09/10/2022   Abdominal pain 04/24/2022   Hypoalbuminemia due to protein-calorie malnutrition 04/24/2022   Pulmonary nodule 04/24/2022   Essential hypertension 04/24/2022   Insomnia 04/24/2022   Actinic keratosis    Allergic rhinitis due to pollen    Cerebrovascular disease    History of COVID-19    Gastro-esophageal reflux disease without esophagitis    Generalized anxiety disorder    Hyperglycemia    Hyperlipidemia    Major depressive disorder    Nonalcoholic steatohepatitis (NASH)    Overactive bladder    History of colonic polyps    Prediabetes    Right bundle branch block    Rosacea    Sciatica, right side    Slow transit constipation    Vitamin B12 deficiency (non anemic)    Vitamin D  deficiency    Osteoarthritis of right knee 01/25/2021    Past Surgical History:  Procedure Laterality Date   HYSTEROTOMY     MASS EXCISION  10/08/2011   Procedure: MINOR EXCISION OF MASS;  Surgeon: Amelie Baize., MD;  Location: [facility];  Service: Orthopedics;  Laterality: Left;  excisional biopsy dorsum left long finger MCP joint    OB History   No obstetric history on file.      Home Medications    Prior to Admission medications   Medication Sig Start Date End Date Taking? Authorizing Provider  ALPRAZolam  (XANAX ) 0.25 MG tablet Take 1 tablet (0.25 mg total) by mouth every 8 (eight) hours as needed for anxiety. 09/24/23   Justina Oman, MD  aspirin  EC 81 MG tablet Take 1 tablet (81 mg total) by mouth daily. Swallow whole. 12/28/22   Colin Dawley, MD  calcium  carbonate (OSCAL) 1500 (600  Ca) MG TABS tablet Take 600 mg of elemental calcium  by mouth in the morning and at bedtime.    [provider]  escitalopram  (LEXAPRO ) 10 MG tablet Take 10 mg by mouth daily.    [provider]  Melatonin 12 MG TABS Take 12 mg by mouth at bedtime.    [provider]  memantine  (NAMENDA ) 10 MG tablet Take 10 mg by mouth at bedtime. 08/06/22   [provider]  midodrine  (PROAMATINE ) 5 MG tablet Take 1 tablet (5 mg total) by mouth 3 (three) times daily with meals. 09/24/23   Justina Oman, MD  Multiple Vitamin (MULTIVITAMIN WITH MINERALS) TABS tablet Take 1 tablet by mouth daily.    [provider]  Multiple Vitamins-Minerals (HAIR SKIN & NAILS PO) Take 1 tablet by mouth daily.    [provider]  omeprazole  (PRILOSEC) 40 MG capsule Take 40 mg by mouth daily.    [provider]  QUEtiapine  (SEROQUEL ) 25 MG tablet Take 1 tablet (25 mg total) by mouth 2 (two) times daily. 09/24/23   Justina Oman, MD  rosuvastatin  (CRESTOR ) 20 MG tablet Take 1 tablet (20 mg total) by mouth daily. 12/27/22 12/27/23  Colin Dawley, MD  saccharomyces boulardii (FLORASTOR) 250 MG capsule Saccharomyces boulardii 250 mg capsule 09/14/23   [provider]  thiamine  (VITAMIN B-1) 100 MG tablet Take 1 tablet (100 mg total) by mouth daily. 04/27/22   Justina Oman, MD  UNABLE TO FIND Apply 1 Application topically every 6 (six) hours as needed (for anxiety/agitation). Med Name: LORAZEPAM  GEL; not a new prescription. (Patient was already on this medication at her ALF) 09/24/23   Justina Oman, MD  valsartan  (DIOVAN ) 40 MG tablet Take 0.5 tablets (20 mg total) by mouth daily. 09/24/23   Justina Oman, MD    Family History Family History  Problem Relation Age of Onset   Dementia Father    Memory loss Father    Breast cancer Neg Hx     Social History Social History   Tobacco Use   Smoking status: Never   Smokeless tobacco: Never  Vaping Use   Vaping  status: Never Used  Substance Use Topics   Alcohol use: Never   Drug use: Never     Allergies   Gatifloxacin, Hydrocodone, Keflex  [cephalexin ], Lorabid [loracarbef], and Minocycline   Review of Systems Review of Systems Per HPI  Physical Exam Triage Vital Signs ED Triage Vitals [12/06/23 1247]  Encounter Vitals Group     BP (!) 95/55     Systolic BP Percentile      Diastolic BP Percentile      Pulse Rate (!) 55     Resp 20     Temp      Temp src      SpO2 98 %     Weight  Height      Head Circumference      Peak Flow      Pain Score 0     Pain Loc      Pain Education      Exclude from Growth Chart    Orthostatic VS for the past 24 hrs:  BP- Lying Pulse- Lying BP- Sitting Pulse- Sitting  12/06/23 1250 101/68 65 115/76 68    Updated Vital Signs BP (!) 95/55 (BP Location: Right Arm)   Pulse (!) 55   Resp 20   SpO2 98%   Visual Acuity Right Eye Distance:   Left Eye Distance:   Bilateral Distance:    Right Eye Near:   Left Eye Near:    Bilateral Near:     Physical Exam Vitals and nursing note reviewed.  Constitutional:      Comments: Appears weak, dozing in and out of sleep throughout time in triage, nonsensical speech  HENT:     Head: Atraumatic.     Nose: Nose normal.     Mouth/Throat:     Mouth: Mucous membranes are moist.  Eyes:     Extraocular Movements: Extraocular movements intact.     Conjunctiva/sclera: Conjunctivae normal.  Cardiovascular:     Rate and Rhythm: Bradycardia present.  Pulmonary:     Effort: Pulmonary effort is normal.  Musculoskeletal:        General: Normal range of motion.     Cervical back: Normal range of motion and neck supple.  Skin:    General: Skin is warm and dry.  Neurological:     Mental Status: She is disoriented.     Motor: Weakness present.  Psychiatric:        Mood and Affect: Mood normal.        Thought Content: Thought content normal.        Judgment: Judgment normal.      UC Treatments /  Results  Labs (all labs ordered are listed, but only abnormal results are displayed) Labs Reviewed  POCT FASTING CBG [facility] MANUAL ENTRY - Abnormal; Notable for the following components:      Result Value   POCT Glucose ([facility]) 156 (*)    All other components within normal limits    EKG   Radiology No results found.  Procedures Procedures (including critical care time)  Medications Ordered in UC Medications - No data to display  Initial Impression / Assessment and Plan / UC Course  I have reviewed the triage vital signs and the nursing notes.  Pertinent labs & imaging results that were available during my care of the patient were reviewed by me and considered in my medical decision making (see chart for details).     Hypotensive and bradycardic in triage, otherwise vital signs stable.  She is weak, lethargic in triage and dozing in and out of sleep throughout conversation.  Unclear what her baseline mental status is, however she is not speaking in coherent sentences while here.  CBG today 156, EKG showing sinus bradycardia at 57 bpm with right bundle branch block with no ST elevations.  Discussed with patient and husband that given the complexity of her medical history and vast differential that she would be best served in the emergency department where there are far more resources for evaluation.  They are agreeable and declined EMS at this time, he wishes to take her via private vehicle to the emergency department.  She was transferred to the emergency department.   Final  Clinical Impressions(s) / UC Diagnoses   Final diagnoses:  Dizziness  Hypotension, unspecified hypotension type  Bradycardia  Weakness  Altered mental status, unspecified altered mental status type   Discharge Instructions   None    ED Prescriptions   None    PDMP not reviewed this encounter.   Corbin Dess, [place] 12/06/23 1801

## 2023-12-06 NOTE — Discharge Instructions (Addendum)
 Stop furosemide (lasix) and vlasartan (diovan ).  Start Midodrine .

## 2023-12-06 NOTE — ED Triage Notes (Signed)
 Pt arrived via POV from Urgent care for evaluation of bradycardia and hypotension. Pt reports decreased appetite X 1 week. Pt recently signed out of [facility] yesterday.

## 2023-12-12 DIAGNOSIS — I700 Atherosclerosis of aorta: Secondary | ICD-10-CM | POA: Diagnosis not present

## 2023-12-12 DIAGNOSIS — L299 Pruritus, unspecified: Secondary | ICD-10-CM | POA: Diagnosis not present

## 2023-12-12 DIAGNOSIS — L304 Erythema intertrigo: Secondary | ICD-10-CM | POA: Diagnosis not present

## 2023-12-12 DIAGNOSIS — I959 Hypotension, unspecified: Secondary | ICD-10-CM | POA: Diagnosis not present

## 2023-12-12 DIAGNOSIS — F331 Major depressive disorder, recurrent, moderate: Secondary | ICD-10-CM | POA: Diagnosis not present

## 2023-12-12 DIAGNOSIS — E7800 Pure hypercholesterolemia, unspecified: Secondary | ICD-10-CM | POA: Diagnosis not present

## 2023-12-12 DIAGNOSIS — G301 Alzheimer's disease with late onset: Secondary | ICD-10-CM | POA: Diagnosis not present

## 2023-12-15 ENCOUNTER — Ambulatory Visit
Admission: RE | Admit: 2023-12-15 | Discharge: 2023-12-15 | Disposition: A | Discharge status: Home / Self Care | Source: Ambulatory Visit | Attending: Family Medicine | Admitting: Family Medicine

## 2023-12-15 DIAGNOSIS — Z1231 Encounter for screening mammogram for malignant neoplasm of breast: Secondary | ICD-10-CM | POA: Diagnosis not present

## 2023-12-19 DIAGNOSIS — F411 Generalized anxiety disorder: Secondary | ICD-10-CM | POA: Diagnosis not present

## 2023-12-19 DIAGNOSIS — F330 Major depressive disorder, recurrent, mild: Secondary | ICD-10-CM | POA: Diagnosis not present

## 2023-12-19 DIAGNOSIS — G309 Alzheimer's disease, unspecified: Secondary | ICD-10-CM | POA: Diagnosis not present

## 2023-12-19 DIAGNOSIS — F02818 Dementia in other diseases classified elsewhere, unspecified severity, with other behavioral disturbance: Secondary | ICD-10-CM | POA: Diagnosis not present

## 2024-01-23 DIAGNOSIS — M170 Bilateral primary osteoarthritis of knee: Secondary | ICD-10-CM | POA: Diagnosis not present

## 2024-02-17 ENCOUNTER — Emergency Department (HOSPITAL_COMMUNITY)

## 2024-02-17 ENCOUNTER — Ambulatory Visit: Admission: EM | Admit: 2024-02-17 | Discharge: 2024-02-17 | Disposition: A | Discharge status: Home / Self Care

## 2024-02-17 ENCOUNTER — Emergency Department (HOSPITAL_COMMUNITY)
Admission: EM | Admit: 2024-02-17 | Discharge: 2024-02-17 | Disposition: A | Discharge status: Home / Self Care | Source: Ambulatory Visit | Attending: Emergency Medicine | Admitting: Emergency Medicine

## 2024-02-17 DIAGNOSIS — R001 Bradycardia, unspecified: Secondary | ICD-10-CM | POA: Insufficient documentation

## 2024-02-17 DIAGNOSIS — F0390 Unspecified dementia without behavioral disturbance: Secondary | ICD-10-CM | POA: Diagnosis not present

## 2024-02-17 DIAGNOSIS — I700 Atherosclerosis of aorta: Secondary | ICD-10-CM | POA: Diagnosis not present

## 2024-02-17 DIAGNOSIS — I959 Hypotension, unspecified: Secondary | ICD-10-CM | POA: Diagnosis not present

## 2024-02-17 DIAGNOSIS — R4182 Altered mental status, unspecified: Secondary | ICD-10-CM

## 2024-02-17 DIAGNOSIS — N3001 Acute cystitis with hematuria: Secondary | ICD-10-CM | POA: Insufficient documentation

## 2024-02-17 DIAGNOSIS — Z743 Need for continuous supervision: Secondary | ICD-10-CM | POA: Diagnosis not present

## 2024-02-17 DIAGNOSIS — Z0389 Encounter for observation for other suspected diseases and conditions ruled out: Secondary | ICD-10-CM | POA: Diagnosis not present

## 2024-02-17 DIAGNOSIS — I771 Stricture of artery: Secondary | ICD-10-CM | POA: Diagnosis not present

## 2024-02-17 DIAGNOSIS — I517 Cardiomegaly: Secondary | ICD-10-CM | POA: Insufficient documentation

## 2024-02-17 DIAGNOSIS — R8290 Unspecified abnormal findings in urine: Secondary | ICD-10-CM | POA: Diagnosis not present

## 2024-02-17 DIAGNOSIS — Z7982 Long term (current) use of aspirin: Secondary | ICD-10-CM | POA: Insufficient documentation

## 2024-02-17 DIAGNOSIS — I4510 Unspecified right bundle-branch block: Secondary | ICD-10-CM | POA: Diagnosis not present

## 2024-02-17 LAB — COMPREHENSIVE METABOLIC PANEL WITH GFR
ALT: 23 U/L (ref 0–44)
AST: 25 U/L (ref 15–41)
Albumin: 3.7 g/dL (ref 3.5–5.0)
Alkaline Phosphatase: 117 U/L (ref 38–126)
Anion gap: 12 (ref 5–15)
BUN: 18 mg/dL (ref 8–23)
CO2: 21 mmol/L — ABNORMAL LOW (ref 22–32)
Calcium: 9.4 mg/dL (ref 8.9–10.3)
Chloride: 106 mmol/L (ref 98–111)
Creatinine, Ser: 0.97 mg/dL (ref 0.44–1.00)
GFR, Estimated: >60 mL/min (ref >60)
Glucose, Bld: 116 mg/dL — ABNORMAL HIGH (ref 70–99)
Potassium: 3.8 mmol/L (ref 3.5–5.1)
Sodium: 139 mmol/L (ref 135–145)
Total Bilirubin: 0.7 mg/dL (ref 0.0–1.2)
Total Protein: 6.8 g/dL (ref 6.5–8.1)

## 2024-02-17 LAB — URINALYSIS, W/ REFLEX TO CULTURE (INFECTION SUSPECTED)
Bilirubin Urine: NEGATIVE
Glucose, UA: NEGATIVE mg/dL
Hgb urine dipstick: NEGATIVE
Ketones, ur: NEGATIVE mg/dL
Nitrite: POSITIVE — AB
Protein, ur: NEGATIVE mg/dL
Specific Gravity, Urine: 1.009 (ref 1.005–1.030)
pH: 8 (ref 5.0–8.0)

## 2024-02-17 LAB — CBC WITH DIFFERENTIAL/PLATELET
Abs Immature Granulocytes: 0.04 K/uL (ref 0.00–0.07)
Basophils Absolute: 0 K/uL (ref 0.0–0.1)
Basophils Relative: 0 %
Eosinophils Absolute: 0.1 K/uL (ref 0.0–0.5)
Eosinophils Relative: 1 %
HCT: 39.4 % (ref 36.0–46.0)
Hemoglobin: 13.1 g/dL (ref 12.0–15.0)
Immature Granulocytes: 0 %
Lymphocytes Relative: 39 %
Lymphs Abs: 3.9 K/uL (ref 0.7–4.0)
MCH: 32.8 pg (ref 26.0–34.0)
MCHC: 33.2 g/dL (ref 30.0–36.0)
MCV: 98.7 fL (ref 80.0–100.0)
Monocytes Absolute: 0.7 K/uL (ref 0.1–1.0)
Monocytes Relative: 7 %
Neutro Abs: 5.2 K/uL (ref 1.7–7.7)
Neutrophils Relative %: 53 %
Platelets: 259 K/uL (ref 150–400)
RBC: 3.99 MIL/uL (ref 3.87–5.11)
RDW: 12.9 % (ref 11.5–15.5)
WBC: 9.9 K/uL (ref 4.0–10.5)
nRBC: 0 % (ref 0.0–0.2)

## 2024-02-17 LAB — PROTIME-INR
INR: 0.9 (ref 0.8–1.2)
Prothrombin Time: 12.8 s (ref 11.4–15.2)

## 2024-02-17 LAB — LACTIC ACID, PLASMA: Lactic Acid, Venous: 1.7 mmol/L (ref 0.5–1.9)

## 2024-02-17 MED ORDER — SODIUM CHLORIDE 0.9 % IV BOLUS
2000.0000 mL | Freq: Once | INTRAVENOUS | Status: AC
  Administered 2024-02-17: 1000 mL via INTRAVENOUS

## 2024-02-17 MED ORDER — SULFAMETHOXAZOLE-TRIMETHOPRIM 800-160 MG PO TABS: 1.0000 | ORAL_TABLET | Freq: Two times a day (BID) | ORAL | 0 refills | Status: AC

## 2024-02-17 MED ORDER — SODIUM CHLORIDE 0.9 % IV SOLN
2.0000 g | Freq: Once | INTRAVENOUS | Status: AC
  Administered 2024-02-17: 2 g via INTRAVENOUS
  Filled 2024-02-17: qty 20

## 2024-02-17 NOTE — ED Notes (Signed)
 Patient is being discharged from the Urgent Care and sent to the Emergency Department via [facility] . Per provider, patient is in need of higher level of care due to hypotension and altered mental status. Patient is aware and verbalizes understanding of plan of care.   Vitals:   02/17/24 1822 02/17/24 1840  BP: (!) 91/56 (!) 69/46  Pulse: 73   Resp: 12   Temp: (!) 97.5 F (36.4 C)   SpO2: 94%

## 2024-02-17 NOTE — Sepsis Progress Note (Signed)
 Following for sepsis monitoring ?

## 2024-02-17 NOTE — Discharge Instructions (Signed)
 Drink plenty of fluids and follow-up with your doctor next week.  Return if any problems

## 2024-02-17 NOTE — ED Notes (Signed)
 911 called to send patient to the ED for hypotension. Report called to [employer] at [facility]. Pt awake and alert oriented to person only which husband says is her baseline.

## 2024-02-17 NOTE — ED Triage Notes (Signed)
 Pt went to [facility] and husband said more sleepy than normal. Hx of dementia. BP 91/62 and she was orthostatic. Fluids started by EMS. Pt is at baseline cognition.

## 2024-02-17 NOTE — ED Triage Notes (Signed)
 Per husband, pt has foul smelling odor, not talking properly since this morning (more confused, blurting things out), since this morning.  sleeping more often x 3 days

## 2024-02-17 NOTE — ED Provider Notes (Signed)
 [facility]    CSN: [id number] Arrival date & time: 02/17/24  1802      History   Chief Complaint No chief complaint on file.   HPI Diana Smith is a 78 y.o. female.   Patient brought in today with her husband who gives 100% of the history as she has dementia.  He states he noticed that she was more fatigued about 3 days ago, then this morning he states she had some speech changes, mental status changes beyond her baseline dementia and foul-smelling urine odor.  He denies notice of vomiting, diarrhea, rashes, fevers, upper respiratory symptoms, mobility changes though she does appear weak overall.  She does have a history of urinary tract infections and he states this is always what they present to like.  So far not trying anything over-the-counter for symptoms.  He states she never drinks enough fluids but today she has had a bit of water and soup.    Past Medical History:  Diagnosis Date   Abdominal pain 04/24/2022   Actinic keratosis    Allergic rhinitis due to pollen    Cerebrovascular disease    Dementia due to Alzheimer's disease 09/10/2022   Essential hypertension 04/24/2022   Frequent urinary tract infections    Gastro-esophageal reflux disease without esophagitis    Generalized anxiety disorder    Heartburn    Hematochezia    History of COVID-19    Hyperglycemia    Hyperlipidemia    Hypoalbuminemia due to protein-calorie malnutrition 04/24/2022   Insomnia 04/24/2022   Major depressive disorder    Nonalcoholic steatohepatitis (NASH)    Osteoarthritis of left knee 03/21/2022   Osteoarthritis of right knee 01/25/2021   Overactive bladder    Personal history of colonic polyps    Prediabetes    Pulmonary nodule 04/24/2022   Pure hypercholesterolemia    Right bundle branch block    Rosacea    Sciatica, right side    Slow transit constipation    Starvation ketoacidosis 04/24/2022   UTI (urinary tract infection) 04/24/2022   Vitamin B12  deficiency (non anemic)    Vitamin D  deficiency     Patient Active Problem List   Diagnosis Date Noted   Weakness 09/24/2023   Confusion 09/24/2023   Hypotension 09/23/2023   History of ischemic stroke 09/23/2023   Acute encephalopathy 09/23/2023   TIA (transient ischemic attack) 12/26/2022   Dementia due to Alzheimer's disease 09/10/2022   Abdominal pain 04/24/2022   Hypoalbuminemia due to protein-calorie malnutrition 04/24/2022   Pulmonary nodule 04/24/2022   Essential hypertension 04/24/2022   Insomnia 04/24/2022   Actinic keratosis    Allergic rhinitis due to pollen    Cerebrovascular disease    History of COVID-19    Gastro-esophageal reflux disease without esophagitis    Generalized anxiety disorder    Hyperglycemia    Hyperlipidemia    Major depressive disorder    Nonalcoholic steatohepatitis (NASH)    Overactive bladder    History of colonic polyps    Prediabetes    Right bundle branch block    Rosacea    Sciatica, right side    Slow transit constipation    Vitamin B12 deficiency (non anemic)    Vitamin D  deficiency    Osteoarthritis of right knee 01/25/2021    Past Surgical History:  Procedure Laterality Date   HYSTEROTOMY     MASS EXCISION  10/08/2011   Procedure: MINOR EXCISION OF MASS;  Surgeon: Lamar LULLA Leonor Mickey., MD;  Location:  [facility];  Service: Orthopedics;  Laterality: Left;  excisional biopsy dorsum left long finger MCP joint    OB History   No obstetric history on file.      Home Medications    Prior to Admission medications   Medication Sig Start Date End Date Taking? Authorizing Provider  ALPRAZolam  (XANAX ) 0.25 MG tablet Take 1 tablet (0.25 mg total) by mouth every 8 (eight) hours as needed for anxiety. 09/24/23   Ricky Fines, MD  aspirin  EC 81 MG tablet Take 1 tablet (81 mg total) by mouth daily. Swallow whole. 12/28/22   Pearlean Manus, MD  calcium  carbonate (OSCAL) 1500 (600 Ca) MG TABS tablet Take 600 mg of  elemental calcium  by mouth in the morning and at bedtime.    [provider]  cephALEXin  (KEFLEX ) 250 MG capsule Take 250 mg by mouth daily. 11/15/23   [provider]  escitalopram  (LEXAPRO ) 10 MG tablet Take 10 mg by mouth daily.    [provider]  estradiol (ESTRACE) 0.1 MG/GM vaginal cream Place 1 Applicatorful vaginally once a week. 11/09/23   [provider]  famotidine  (PEPCID ) 20 MG tablet Take 20 mg by mouth at bedtime. 11/19/23   [provider]  ibuprofen (ADVIL) 600 MG tablet Take 600 mg by mouth every 4 (four) hours as needed for mild pain (pain score 1-3). 11/30/23   [provider]  Melatonin 12 MG TABS Take 12 mg by mouth at bedtime.    [provider]  memantine  (NAMENDA ) 10 MG tablet Take 10 mg by mouth at bedtime. 08/06/22   [provider]  midodrine  (PROAMATINE ) 5 MG tablet Take 1 tablet (5 mg total) by mouth 3 (three) times daily with meals. 12/06/23   Haviland, Julie, MD  Multiple Vitamin (MULTIVITAMIN WITH MINERALS) TABS tablet Take 1 tablet by mouth daily.    [provider]  Multiple Vitamins-Minerals (HAIR SKIN & NAILS PO) Take 1 tablet by mouth daily.    [provider]  nystatin cream (MYCOSTATIN) Apply 1 Application topically 2 (two) times daily. 11/20/23   [provider]  omeprazole  (PRILOSEC) 40 MG capsule Take 40 mg by mouth daily.    [provider]  potassium chloride  SA (KLOR-CON  M) 20 MEQ tablet Take 20 mEq by mouth daily. 11/10/23   [provider]  QUEtiapine  (SEROQUEL ) 25 MG tablet Take 1 tablet (25 mg total) by mouth 2 (two) times daily. 09/24/23   Ricky Fines, MD  rosuvastatin  (CRESTOR ) 20 MG tablet Take 1 tablet (20 mg total) by mouth daily. 12/27/22 12/27/23  Pearlean Manus, MD  saccharomyces boulardii (FLORASTOR) 250 MG capsule Saccharomyces boulardii 250 mg capsule 09/14/23   [provider]  thiamine  (VITAMIN B-1) 100 MG tablet Take 1  tablet (100 mg total) by mouth daily. 04/27/22   Ricky Fines, MD  UNABLE TO FIND Apply 1 Application topically every 6 (six) hours as needed (for anxiety/agitation). Med Name: LORAZEPAM  GEL; not a new prescription. (Patient was already on this medication at her ALF) 09/24/23   Ricky Fines, MD    Family History Family History  Problem Relation Age of Onset   Dementia Father    Memory loss Father    Breast cancer Neg Hx     Social History Social History   Tobacco Use   Smoking status: Never   Smokeless tobacco: Never  Vaping Use   Vaping status: Never Used  Substance Use Topics   Alcohol use: Never   Drug  use: Never     Allergies   Gatifloxacin, Hydrocodone, Keflex  [cephalexin ], Lorabid [loracarbef], and Minocycline   Review of Systems Review of Systems Per HPI  Physical Exam Triage Vital Signs ED Triage Vitals  Encounter Vitals Group     BP 02/17/24 1822 (!) 91/56     Girls Systolic BP Percentile --      Girls Diastolic BP Percentile --      Boys Systolic BP Percentile --      Boys Diastolic BP Percentile --      Pulse Rate 02/17/24 1822 73     Resp 02/17/24 1822 12     Temp 02/17/24 1822 (!) 97.5 F (36.4 C)     Temp Source 02/17/24 1822 Oral     SpO2 02/17/24 1822 94 %     Weight --      Height --      Head Circumference --      Peak Flow --      Pain Score 02/17/24 1823 0     Pain Loc --      Pain Education --      Exclude from Growth Chart --    Orthostatic VS for the past 24 hrs:  BP- Lying Pulse- Lying BP- Sitting Pulse- Sitting BP- Standing at 0 minutes Pulse- Standing at 0 minutes  02/17/24 1830 96/83 72 (!) 78/55 62 (!) 69/46 70    Updated Vital Signs BP (!) 69/46 (BP Location: Right Arm)   Pulse 73   Temp (!) 97.5 F (36.4 C) (Oral)   Resp 12   SpO2 94%   Visual Acuity Right Eye Distance:   Left Eye Distance:   Bilateral Distance:    Right Eye Near:   Left Eye Near:    Bilateral Near:     Physical Exam Vitals and nursing  note reviewed.  Constitutional:      Appearance: She is not ill-appearing.     Comments: Weak, dozing off with head falling back intermittently throughout exam, closing eyes frequently throughout exam, following prompts after multiple requests  HENT:     Head: Atraumatic.  Eyes:     Conjunctiva/sclera: Conjunctivae normal.  Cardiovascular:     Rate and Rhythm: Normal rate and regular rhythm.     Heart sounds: Normal heart sounds.  Pulmonary:     Effort: Pulmonary effort is normal.     Breath sounds: Normal breath sounds.  Musculoskeletal:        General: Normal range of motion.     Cervical back: Normal range of motion and neck supple.  Skin:    General: Skin is warm and dry.     Coloration: Skin is pale.  Neurological:     Comments: Mental status decreased from baseline per patient's husband, unclear what her baseline is to evaluate this.  Grip strength full and equal bilaterally, movements appear symmetric bilaterally  Psychiatric:     Comments: Appears altered but unclear baseline with her dementia      UC Treatments / Results  Labs (all labs ordered are listed, but only abnormal results are displayed) Labs Reviewed - No data to display  EKG   Radiology No results found.  Procedures Procedures (including critical care time)  Medications Ordered in UC Medications - No data to display  Initial Impression / Assessment and Plan / UC Course  I have reviewed the triage vital signs and the nursing notes.  Pertinent labs & imaging results that were available during my care of  the patient were reviewed by me and considered in my medical decision making (see chart for details).     Patient hypotensive in triage, orthostatics performed and upon standing patient's blood pressure dropped to 69/46 and she became incredibly weak and required assistance into a wheelchair at that time in order to prevent falling.  EMS was called, she was safely lowered into a wheelchair and  monitored until their arrival.  She was brought to the emergency department via EMS.  Final Clinical Impressions(s) / UC Diagnoses   Final diagnoses:  Hypotension, unspecified hypotension type  Altered mental status, unspecified altered mental status type  Dementia, unspecified dementia severity, unspecified dementia type, unspecified whether behavioral, psychotic, or mood disturbance or anxiety (HCC)  Abnormal urine odor   Discharge Instructions   None    ED Prescriptions   None    PDMP not reviewed this encounter.   Stuart Vernell Norris, [place] 02/17/24 1905

## 2024-02-17 NOTE — ED Notes (Signed)
 Cultures obtained.

## 2024-02-19 LAB — URINE CULTURE: Culture: <10000 — AB

## 2024-02-20 NOTE — ED Provider Notes (Addendum)
 [place] [facility] AT [facility] Provider Note   CSN: [id number] Arrival date & time: 02/17/24  1907     Patient presents with: Hypotension   Diana Smith is a 78 y.o. female.   Patient was sent over from the urgent care because she was hypotensive and a little lethargic.  She has a history of dementia  The history is provided by the patient and medical records. No language interpreter was used.  Dysuria Pain quality:  Burning Pain severity:  Mild Onset quality:  Sudden Timing:  Constant Progression:  Waxing and waning Chronicity:  New Recent urinary tract infections: no   Relieved by:  None tried Worsened by:  Nothing Associated symptoms: no abdominal pain        Prior to Admission medications   Medication Sig Start Date End Date Taking? Authorizing Provider  sulfamethoxazole -trimethoprim  (BACTRIM  DS) 800-160 MG tablet Take 1 tablet by mouth 2 (two) times daily for 7 days. 02/17/24 02/24/24 Yes Suzette Pac, MD  ALPRAZolam  (XANAX ) 0.25 MG tablet Take 1 tablet (0.25 mg total) by mouth every 8 (eight) hours as needed for anxiety. 09/24/23   Ricky Fines, MD  aspirin  EC 81 MG tablet Take 1 tablet (81 mg total) by mouth daily. Swallow whole. 12/28/22   Pearlean Manus, MD  calcium  carbonate (OSCAL) 1500 (600 Ca) MG TABS tablet Take 600 mg of elemental calcium  by mouth in the morning and at bedtime.    [provider]  cephALEXin  (KEFLEX ) 250 MG capsule Take 250 mg by mouth daily. 11/15/23   [provider]  escitalopram  (LEXAPRO ) 10 MG tablet Take 10 mg by mouth daily.    [provider]  estradiol (ESTRACE) 0.1 MG/GM vaginal cream Place 1 Applicatorful vaginally once a week. 11/09/23   [provider]  famotidine  (PEPCID ) 20 MG tablet Take 20 mg by mouth at bedtime. 11/19/23   [provider]  ibuprofen (ADVIL) 600 MG tablet Take 600 mg by mouth every 4 (four) hours as needed for mild pain (pain score 1-3).  11/30/23   [provider]  Melatonin 12 MG TABS Take 12 mg by mouth at bedtime.    [provider]  memantine  (NAMENDA ) 10 MG tablet Take 10 mg by mouth at bedtime. 08/06/22   [provider]  midodrine  (PROAMATINE ) 5 MG tablet Take 1 tablet (5 mg total) by mouth 3 (three) times daily with meals. 12/06/23   Haviland, Julie, MD  Multiple Vitamin (MULTIVITAMIN WITH MINERALS) TABS tablet Take 1 tablet by mouth daily.    [provider]  Multiple Vitamins-Minerals (HAIR SKIN & NAILS PO) Take 1 tablet by mouth daily.    [provider]  nystatin cream (MYCOSTATIN) Apply 1 Application topically 2 (two) times daily. 11/20/23   [provider]  omeprazole  (PRILOSEC) 40 MG capsule Take 40 mg by mouth daily.    [provider]  potassium chloride  SA (KLOR-CON  M) 20 MEQ tablet Take 20 mEq by mouth daily. 11/10/23   [provider]  QUEtiapine  (SEROQUEL ) 25 MG tablet Take 1 tablet (25 mg total) by mouth 2 (two) times daily. 09/24/23   Ricky Fines, MD  rosuvastatin  (CRESTOR ) 20 MG tablet Take 1 tablet (20 mg total) by mouth daily. 12/27/22 12/27/23  Pearlean Manus, MD  saccharomyces boulardii (FLORASTOR) 250 MG capsule Saccharomyces boulardii 250 mg capsule 09/14/23   [provider]  thiamine  (VITAMIN B-1) 100 MG tablet Take 1 tablet (100 mg total) by mouth daily. 04/27/22  Ricky Fines, MD  UNABLE TO FIND Apply 1 Application topically every 6 (six) hours as needed (for anxiety/agitation). Med Name: LORAZEPAM  GEL; not a new prescription. (Patient was already on this medication at her ALF) 09/24/23   Ricky Fines, MD    Allergies: Gatifloxacin, Hydrocodone, Keflex  [cephalexin ], Lorabid [loracarbef], and Minocycline    Review of Systems  Constitutional:  Negative for appetite change and fatigue.  HENT:  Negative for congestion, ear discharge and sinus pressure.   Eyes:  Negative for discharge.  Respiratory:  Negative for cough.    Cardiovascular:  Negative for chest pain.  Gastrointestinal:  Negative for abdominal pain and diarrhea.  Genitourinary:  Positive for dysuria. Negative for frequency and hematuria.  Musculoskeletal:  Negative for back pain.  Skin:  Negative for rash.  Neurological:  Negative for seizures and headaches.  Psychiatric/Behavioral:  Negative for hallucinations.     Updated Vital Signs BP (!) 151/86   Pulse 67   Temp 98.2 F (36.8 C) (Oral)   Resp (!) 21   SpO2 98%   Physical Exam Vitals and nursing note reviewed.  Constitutional:      Appearance: She is well-developed.  HENT:     Head: Normocephalic.     Nose: Nose normal.  Eyes:     General: No scleral icterus.    Conjunctiva/sclera: Conjunctivae normal.  Neck:     Thyroid : No thyromegaly.  Cardiovascular:     Rate and Rhythm: Normal rate and regular rhythm.     Heart sounds: No murmur heard.    No friction rub. No gallop.  Pulmonary:     Breath sounds: No stridor. No wheezing or rales.  Chest:     Chest wall: No tenderness.  Abdominal:     General: There is no distension.     Tenderness: There is no abdominal tenderness. There is no rebound.  Musculoskeletal:        General: Normal range of motion.     Cervical back: Neck supple.  Lymphadenopathy:     Cervical: No cervical adenopathy.  Skin:    Findings: No erythema or rash.  Neurological:     Mental Status: She is alert and oriented to person, place, and time.     Motor: No abnormal muscle tone.     Coordination: Coordination normal.  Psychiatric:        Behavior: Behavior normal.     (all labs ordered are listed, but only abnormal results are displayed) Labs Reviewed  URINE CULTURE - Abnormal; Notable for the following components:      Result Value   Culture   (*)    Value: <10,000 COLONIES/mL INSIGNIFICANT GROWTH Performed at [facility], [address], [place], [place] [address]    All other components within normal limits  COMPREHENSIVE  METABOLIC PANEL WITH GFR - Abnormal; Notable for the following components:   CO2 21 (*)    Glucose, Bld 116 (*)    All other components within normal limits  URINALYSIS, W/ REFLEX TO CULTURE (INFECTION SUSPECTED) - Abnormal; Notable for the following components:   Color, Urine STRAW (*)    Nitrite POSITIVE (*)    Leukocytes,Ua SMALL (*)    Bacteria, UA RARE (*)    All other components within normal limits  CULTURE, BLOOD (ROUTINE X 2)  CULTURE, BLOOD (ROUTINE X 2)  RESP PANEL BY RT-PCR (RSV, FLU A&B, COVID)  RVPGX2  LACTIC ACID, PLASMA  CBC WITH DIFFERENTIAL/PLATELET  PROTIME-INR    EKG: EKG  Interpretation Date/Time:  Friday February 17 2024 19:23:05 EDT Ventricular Rate:  47 PR Interval:  170 QRS Duration:  144 QT Interval:  496 QTC Calculation: 439 R Axis:   -81  Text Interpretation: Sinus bradycardia RBBB and LAFB Confirmed by Francesca Fallow ([id number]) on 02/18/2024 2:59:40 PM  Radiology: No results found.   Procedures   Medications Ordered in the ED  sodium chloride  0.9 % bolus 2,000 mL (0 mLs Intravenous Stopped 02/17/24 2028)  cefTRIAXone  (ROCEPHIN ) 2 g in sodium chloride  0.9 % 100 mL IVPB (0 g Intravenous Stopped 02/17/24 2028)                                    Medical Decision Making Amount and/or Complexity of Data Reviewed Labs: ordered. Radiology: ordered.  Risk Prescription drug management.   Patient with urinary tract infection.  She is started on Keflex .  Patient with mild dehydration that is resolved with fluids     Final diagnoses:  Acute cystitis with hematuria    ED Discharge Orders          Ordered    sulfamethoxazole -trimethoprim  (BACTRIM  DS) 800-160 MG tablet  2 times daily        02/17/24 2054               Suzette Pac, MD 02/20/24 1658    Suzette Pac, MD 02/20/24 1700

## 2024-02-22 LAB — CULTURE, BLOOD (ROUTINE X 2)
Culture: NO GROWTH
Culture: NO GROWTH
Special Requests: ADEQUATE
Special Requests: ADEQUATE

## 2024-03-07 DIAGNOSIS — N3020 Other chronic cystitis without hematuria: Secondary | ICD-10-CM | POA: Diagnosis not present

## 2024-03-08 DIAGNOSIS — E559 Vitamin D deficiency, unspecified: Secondary | ICD-10-CM | POA: Diagnosis not present

## 2024-03-08 DIAGNOSIS — M2550 Pain in unspecified joint: Secondary | ICD-10-CM | POA: Diagnosis not present

## 2024-03-08 DIAGNOSIS — I10 Essential (primary) hypertension: Secondary | ICD-10-CM | POA: Diagnosis not present

## 2024-03-08 DIAGNOSIS — M8580 Other specified disorders of bone density and structure, unspecified site: Secondary | ICD-10-CM | POA: Diagnosis not present

## 2024-03-08 DIAGNOSIS — G301 Alzheimer's disease with late onset: Secondary | ICD-10-CM | POA: Diagnosis not present

## 2024-03-08 DIAGNOSIS — E119 Type 2 diabetes mellitus without complications: Secondary | ICD-10-CM | POA: Diagnosis not present

## 2024-03-08 DIAGNOSIS — E538 Deficiency of other specified B group vitamins: Secondary | ICD-10-CM | POA: Diagnosis not present

## 2024-03-08 DIAGNOSIS — Z0000 Encounter for general adult medical examination without abnormal findings: Secondary | ICD-10-CM | POA: Diagnosis not present

## 2024-03-08 DIAGNOSIS — E785 Hyperlipidemia, unspecified: Secondary | ICD-10-CM | POA: Diagnosis not present

## 2024-03-08 DIAGNOSIS — L299 Pruritus, unspecified: Secondary | ICD-10-CM | POA: Diagnosis not present

## 2024-03-08 DIAGNOSIS — Z23 Encounter for immunization: Secondary | ICD-10-CM | POA: Diagnosis not present

## 2024-03-10 ENCOUNTER — Emergency Department (HOSPITAL_COMMUNITY)

## 2024-03-10 ENCOUNTER — Emergency Department (HOSPITAL_COMMUNITY)
Admission: EM | Admit: 2024-03-10 | Discharge: 2024-03-10 | Disposition: A | Discharge status: Home / Self Care | Attending: Emergency Medicine | Admitting: Emergency Medicine

## 2024-03-10 DIAGNOSIS — Z8673 Personal history of transient ischemic attack (TIA), and cerebral infarction without residual deficits: Secondary | ICD-10-CM | POA: Diagnosis not present

## 2024-03-10 DIAGNOSIS — G308 Other Alzheimer's disease: Secondary | ICD-10-CM | POA: Diagnosis not present

## 2024-03-10 DIAGNOSIS — F0280 Dementia in other diseases classified elsewhere without behavioral disturbance: Secondary | ICD-10-CM | POA: Insufficient documentation

## 2024-03-10 DIAGNOSIS — I499 Cardiac arrhythmia, unspecified: Secondary | ICD-10-CM | POA: Diagnosis not present

## 2024-03-10 DIAGNOSIS — I517 Cardiomegaly: Secondary | ICD-10-CM | POA: Diagnosis not present

## 2024-03-10 DIAGNOSIS — N390 Urinary tract infection, site not specified: Secondary | ICD-10-CM | POA: Diagnosis not present

## 2024-03-10 DIAGNOSIS — R5383 Other fatigue: Secondary | ICD-10-CM | POA: Insufficient documentation

## 2024-03-10 DIAGNOSIS — R531 Weakness: Secondary | ICD-10-CM | POA: Insufficient documentation

## 2024-03-10 DIAGNOSIS — Z8616 Personal history of COVID-19: Secondary | ICD-10-CM | POA: Insufficient documentation

## 2024-03-10 DIAGNOSIS — I10 Essential (primary) hypertension: Secondary | ICD-10-CM | POA: Insufficient documentation

## 2024-03-10 DIAGNOSIS — R404 Transient alteration of awareness: Secondary | ICD-10-CM | POA: Diagnosis not present

## 2024-03-10 DIAGNOSIS — F29 Unspecified psychosis not due to a substance or known physiological condition: Secondary | ICD-10-CM | POA: Diagnosis not present

## 2024-03-10 DIAGNOSIS — F0390 Unspecified dementia without behavioral disturbance: Secondary | ICD-10-CM | POA: Diagnosis not present

## 2024-03-10 DIAGNOSIS — Z7982 Long term (current) use of aspirin: Secondary | ICD-10-CM | POA: Diagnosis not present

## 2024-03-10 LAB — URINALYSIS, W/ REFLEX TO CULTURE (INFECTION SUSPECTED)
Bacteria, UA: NONE SEEN
Bilirubin Urine: NEGATIVE
Glucose, UA: NEGATIVE mg/dL
Hgb urine dipstick: NEGATIVE
Ketones, ur: NEGATIVE mg/dL
Leukocytes,Ua: NEGATIVE
Nitrite: NEGATIVE
Protein, ur: NEGATIVE mg/dL
Specific Gravity, Urine: 1.005 (ref 1.005–1.030)
pH: 7 (ref 5.0–8.0)

## 2024-03-10 LAB — RESP PANEL BY RT-PCR (RSV, FLU A&B, COVID)  RVPGX2
Influenza A by PCR: NEGATIVE
Influenza B by PCR: NEGATIVE
Resp Syncytial Virus by PCR: NEGATIVE
SARS Coronavirus 2 by RT PCR: NEGATIVE

## 2024-03-10 LAB — COMPREHENSIVE METABOLIC PANEL WITH GFR
ALT: 31 U/L (ref 0–44)
AST: 34 U/L (ref 15–41)
Albumin: 3.2 g/dL — ABNORMAL LOW (ref 3.5–5.0)
Alkaline Phosphatase: 92 U/L (ref 38–126)
Anion gap: 8 (ref 5–15)
BUN: 12 mg/dL (ref 8–23)
CO2: 22 mmol/L (ref 22–32)
Calcium: 8.4 mg/dL — ABNORMAL LOW (ref 8.9–10.3)
Chloride: 110 mmol/L (ref 98–111)
Creatinine, Ser: 0.78 mg/dL (ref 0.44–1.00)
GFR, Estimated: >60 mL/min (ref >60)
Glucose, Bld: 129 mg/dL — ABNORMAL HIGH (ref 70–99)
Potassium: 3.6 mmol/L (ref 3.5–5.1)
Sodium: 140 mmol/L (ref 135–145)
Total Bilirubin: 0.8 mg/dL (ref 0.0–1.2)
Total Protein: 5.9 g/dL — ABNORMAL LOW (ref 6.5–8.1)

## 2024-03-10 LAB — CBC WITH DIFFERENTIAL/PLATELET
Abs Immature Granulocytes: 0.03 K/uL (ref 0.00–0.07)
Basophils Absolute: 0 K/uL (ref 0.0–0.1)
Basophils Relative: 0 %
Eosinophils Absolute: 0.1 K/uL (ref 0.0–0.5)
Eosinophils Relative: 1 %
HCT: 34.8 % — ABNORMAL LOW (ref 36.0–46.0)
Hemoglobin: 11.8 g/dL — ABNORMAL LOW (ref 12.0–15.0)
Immature Granulocytes: 0 %
Lymphocytes Relative: 13 %
Lymphs Abs: 1 K/uL (ref 0.7–4.0)
MCH: 33.7 pg (ref 26.0–34.0)
MCHC: 33.9 g/dL (ref 30.0–36.0)
MCV: 99.4 fL (ref 80.0–100.0)
Monocytes Absolute: 0.5 K/uL (ref 0.1–1.0)
Monocytes Relative: 7 %
Neutro Abs: 5.9 K/uL (ref 1.7–7.7)
Neutrophils Relative %: 79 %
Platelets: 180 K/uL (ref 150–400)
RBC: 3.5 MIL/uL — ABNORMAL LOW (ref 3.87–5.11)
RDW: 13.2 % (ref 11.5–15.5)
WBC: 7.6 K/uL (ref 4.0–10.5)
nRBC: 0 % (ref 0.0–0.2)

## 2024-03-10 LAB — AMMONIA: Ammonia: <13 umol/L (ref 9–35)

## 2024-03-10 LAB — ETHANOL: Alcohol, Ethyl (B): <15 mg/dL (ref <15)

## 2024-03-10 LAB — CBG MONITORING, ED: Glucose-Capillary: 155 mg/dL — ABNORMAL HIGH (ref 70–99)

## 2024-03-10 LAB — TSH: TSH: 1.727 u[IU]/mL (ref 0.350–4.500)

## 2024-03-10 MED ORDER — SODIUM CHLORIDE 0.9 % IV BOLUS
1000.0000 mL | Freq: Once | INTRAVENOUS | Status: AC
  Administered 2024-03-10: 1000 mL via INTRAVENOUS

## 2024-03-10 NOTE — ED Triage Notes (Signed)
 Patient arrives by EMS from home due to call out for patient becoming somnolent while sitting at kitchen table. Family reports patient put her head down as if she was too weak to hold it up. Dementia is baseline, able to recall name. Patient seen urologist Thursday 9/11 and was checked for UTI, family reports it was negative and she is prone to getting UTIs. CBG upon arrival 155

## 2024-03-10 NOTE — Discharge Instructions (Signed)
 We evaluated Diana Smith for her episode of weakness.  Her testing in the emergency department is reassuring.  We did not see any dangerous cause of her episode of weakness today.  Since she has returned to her normal self and her testing is reassuring, we believe it is safe for her to go home.  We would recommend that she follow-up with her primary doctor.  If she develops any new symptoms such as persistent unresponsiveness, complaints of severe headaches, develops vomiting or fevers, difficulty breathing, or any other new symptoms, please bring her back to the emergency department.

## 2024-03-10 NOTE — ED Provider Notes (Signed)
 [place] EMERGENCY DEPARTMENT AT [facility] Provider Note  CSN: [id number] Arrival date & time: 03/10/24 1158  Chief Complaint(s) Fatigue  HPI Diana Smith is a 78 y.o. female history of dementia, hypertension, hyperlipidemia, prior stroke presenting to the emergency department for episode of decreased responsiveness.  Husband reports that they were eating breakfast, she was not eating anything, she had episode where she slumped over and seemed to have decreased responsiveness.  No seizure activity.  Family called paramedics.  They checked her glucose which was normal.  Seem to be improved.  Paramedics brought her to the emergency department.  Husband reports that otherwise she has been at her baseline.  No fevers or chills, vomiting.  Had not eaten or drink anything this morning and did not eat dinner last night.  No diarrhea.  No chest pain or shortness of breath.  Currently, husband reports she is at her baseline.  Patient denies any pain or complaints at this time   Past Medical History Past Medical History:  Diagnosis Date   Abdominal pain 04/24/2022   Actinic keratosis    Allergic rhinitis due to pollen    Cerebrovascular disease    Dementia due to Alzheimer's disease 09/10/2022   Essential hypertension 04/24/2022   Frequent urinary tract infections    Gastro-esophageal reflux disease without esophagitis    Generalized anxiety disorder    Heartburn    Hematochezia    History of COVID-19    Hyperglycemia    Hyperlipidemia    Hypoalbuminemia due to protein-calorie malnutrition 04/24/2022   Insomnia 04/24/2022   Major depressive disorder    Nonalcoholic steatohepatitis (NASH)    Osteoarthritis of left knee 03/21/2022   Osteoarthritis of right knee 01/25/2021   Overactive bladder    Personal history of colonic polyps    Prediabetes    Pulmonary nodule 04/24/2022   Pure hypercholesterolemia    Right bundle branch block    Rosacea    Sciatica, right side     Slow transit constipation    Starvation ketoacidosis 04/24/2022   UTI (urinary tract infection) 04/24/2022   Vitamin B12 deficiency (non anemic)    Vitamin D  deficiency    Patient Active Problem List   Diagnosis Date Noted   Weakness 09/24/2023   Confusion 09/24/2023   Hypotension 09/23/2023   History of ischemic stroke 09/23/2023   Acute encephalopathy 09/23/2023   TIA (transient ischemic attack) 12/26/2022   Dementia due to Alzheimer's disease 09/10/2022   Abdominal pain 04/24/2022   Hypoalbuminemia due to protein-calorie malnutrition 04/24/2022   Pulmonary nodule 04/24/2022   Essential hypertension 04/24/2022   Insomnia 04/24/2022   Actinic keratosis    Allergic rhinitis due to pollen    Cerebrovascular disease    History of COVID-19    Gastro-esophageal reflux disease without esophagitis    Generalized anxiety disorder    Hyperglycemia    Hyperlipidemia    Major depressive disorder    Nonalcoholic steatohepatitis (NASH)    Overactive bladder    History of colonic polyps    Prediabetes    Right bundle branch block    Rosacea    Sciatica, right side    Slow transit constipation    Vitamin B12 deficiency (non anemic)    Vitamin D  deficiency    Osteoarthritis of right knee 01/25/2021   Home Medication(s) Prior to Admission medications   Medication Sig Start Date End Date Taking? Authorizing Provider  ALPRAZolam  (XANAX ) 0.25 MG tablet Take 1 tablet (0.25 mg total) by  mouth every 8 (eight) hours as needed for anxiety. 09/24/23   Ricky Fines, MD  aspirin  EC 81 MG tablet Take 1 tablet (81 mg total) by mouth daily. Swallow whole. 12/28/22   Pearlean Manus, MD  calcium  carbonate (OSCAL) 1500 (600 Ca) MG TABS tablet Take 600 mg of elemental calcium  by mouth in the morning and at bedtime.    [provider]  cephALEXin  (KEFLEX ) 250 MG capsule Take 250 mg by mouth daily. 11/15/23   [provider]  escitalopram  (LEXAPRO ) 10 MG tablet Take 10 mg by mouth  daily.    [provider]  estradiol (ESTRACE) 0.1 MG/GM vaginal cream Place 1 Applicatorful vaginally once a week. 11/09/23   [provider]  famotidine  (PEPCID ) 20 MG tablet Take 20 mg by mouth at bedtime. 11/19/23   [provider]  ibuprofen (ADVIL) 600 MG tablet Take 600 mg by mouth every 4 (four) hours as needed for mild pain (pain score 1-3). 11/30/23   [provider]  Melatonin 12 MG TABS Take 12 mg by mouth at bedtime.    [provider]  memantine  (NAMENDA ) 10 MG tablet Take 10 mg by mouth at bedtime. 08/06/22   [provider]  midodrine  (PROAMATINE ) 5 MG tablet Take 1 tablet (5 mg total) by mouth 3 (three) times daily with meals. 12/06/23   Haviland, Julie, MD  Multiple Vitamin (MULTIVITAMIN WITH MINERALS) TABS tablet Take 1 tablet by mouth daily.    [provider]  Multiple Vitamins-Minerals (HAIR SKIN & NAILS PO) Take 1 tablet by mouth daily.    [provider]  nystatin cream (MYCOSTATIN) Apply 1 Application topically 2 (two) times daily. 11/20/23   [provider]  omeprazole  (PRILOSEC) 40 MG capsule Take 40 mg by mouth daily.    [provider]  potassium chloride  SA (KLOR-CON  M) 20 MEQ tablet Take 20 mEq by mouth daily. 11/10/23   [provider]  QUEtiapine  (SEROQUEL ) 25 MG tablet Take 1 tablet (25 mg total) by mouth 2 (two) times daily. 09/24/23   Ricky Fines, MD  rosuvastatin  (CRESTOR ) 20 MG tablet Take 1 tablet (20 mg total) by mouth daily. 12/27/22 12/27/23  Pearlean Manus, MD  saccharomyces boulardii (FLORASTOR) 250 MG capsule Saccharomyces boulardii 250 mg capsule 09/14/23   [provider]  thiamine  (VITAMIN B-1) 100 MG tablet Take 1 tablet (100 mg total) by mouth daily. 04/27/22   Ricky Fines, MD  UNABLE TO FIND Apply 1 Application topically every 6 (six) hours as needed (for anxiety/agitation). Med Name: LORAZEPAM  GEL; not a new prescription. (Patient was already on this  medication at her ALF) 09/24/23   Ricky Fines, MD                                                                                                                                    Past Surgical History Past Surgical History:  Procedure Laterality Date  HYSTEROTOMY     MASS EXCISION  10/08/2011   Procedure: MINOR EXCISION OF MASS;  Surgeon: Lamar LULLA Leonor Mickey., MD;  Location: [facility];  Service: Orthopedics;  Laterality: Left;  excisional biopsy dorsum left long finger MCP joint   Family History Family History  Problem Relation Age of Onset   Dementia Father    Memory loss Father    Breast cancer Neg Hx     Social History Social History   Tobacco Use   Smoking status: Never   Smokeless tobacco: Never  Vaping Use   Vaping status: Never Used  Substance Use Topics   Alcohol use: Never   Drug use: Never   Allergies Gatifloxacin, Hydrocodone, Keflex  [cephalexin ], Lorabid [loracarbef], and Minocycline  Review of Systems Review of Systems  All other systems reviewed and are negative.   Physical Exam Vital Signs  I have reviewed the triage vital signs BP (!) 148/61   Pulse (!) 56   Temp 97.7 F (36.5 C) (Oral)   Resp 12   Ht 5' 5 (1.651 m)   Wt 77.1 kg   SpO2 97%   BMI 28.29 kg/m  Physical Exam Vitals and nursing note reviewed.  Constitutional:      General: She is not in acute distress.    Appearance: She is well-developed.  HENT:     Head: Normocephalic and atraumatic.     Mouth/Throat:     Mouth: Mucous membranes are moist.  Eyes:     Pupils: Pupils are equal, round, and reactive to light.  Cardiovascular:     Rate and Rhythm: Normal rate and regular rhythm.     Heart sounds: No murmur heard. Pulmonary:     Effort: Pulmonary effort is normal. No respiratory distress.     Breath sounds: Normal breath sounds.  Abdominal:     General: Abdomen is flat.     Palpations: Abdomen is soft.     Tenderness: There is no abdominal tenderness.   Musculoskeletal:        General: No tenderness.     Right lower leg: No edema.     Left lower leg: No edema.  Skin:    General: Skin is warm and dry.  Neurological:     General: No focal deficit present.     Mental Status: She is alert. Mental status is at baseline.     Comments: Cranial nerves II through XII intact, strength 5 out of 5 in the bilateral upper and lower extremities, no sensory deficit to light touch, no dysmetria on finger-nose-finger testing. Oriented to self, place, not to recent events or date  Psychiatric:        Mood and Affect: Mood normal.        Behavior: Behavior normal.     ED Results and Treatments Labs (all labs ordered are listed, but only abnormal results are displayed) Labs Reviewed  COMPREHENSIVE METABOLIC PANEL WITH GFR - Abnormal; Notable for the following components:      Result Value   Glucose, Bld 129 (*)    Calcium  8.4 (*)    Total Protein 5.9 (*)    Albumin 3.2 (*)    All other components within normal limits  CBC WITH DIFFERENTIAL/PLATELET - Abnormal; Notable for the following components:   RBC 3.50 (*)    Hemoglobin 11.8 (*)    HCT 34.8 (*)    All other components within normal limits  URINALYSIS, W/ REFLEX TO CULTURE (INFECTION SUSPECTED) - Abnormal; Notable for  the following components:   Color, Urine STRAW (*)    All other components within normal limits  CBG MONITORING, ED - Abnormal; Notable for the following components:   Glucose-Capillary 155 (*)    All other components within normal limits  RESP PANEL BY RT-PCR (RSV, FLU A&B, COVID)  RVPGX2  ETHANOL  AMMONIA  TSH                                                                                                                          Radiology DG Chest Port 1 View Result Date: 03/10/2024 EXAM: 1 VIEW XRAY OF THE CHEST 03/10/2024 01:17:00 PM COMPARISON: 02/17/2024 CLINICAL HISTORY: Weakness. Per chart: Patient arrives by EMS from home due to call out for patient becoming  somnolent while sitting at kitchen table. Family reports patient put her head down as if she was too weak to hold it up. Dementia is baseline, able to recall name. Patient seen urologist Thursday 9/11 and was checked for UTI, family reports it was negative and she is prone to getting UTIs. CBG upon arrival 155. FINDINGS: LUNGS AND PLEURA: No focal pulmonary opacity. No pulmonary edema. No pleural effusion. No pneumothorax. HEART AND MEDIASTINUM: Cardiomegaly. Aortic calcification. BONES AND SOFT TISSUES: No acute osseous abnormality. IMPRESSION: 1. No acute findings. 2. Cardiomegaly and aortic calcification. Electronically signed by: Waddell Calk MD 03/10/2024 01:52 PM EDT RP Workstation: [id number]   CT Head Wo Contrast Result Date: 03/10/2024 EXAM: CT HEAD WITHOUT CONTRAST 03/10/2024 01:03:46 PM TECHNIQUE: CT of the head was performed without the administration of intravenous contrast. Automated exposure control, iterative reconstruction, and/or weight based adjustment of the mA/kV was utilized to reduce the radiation dose to as low as reasonably achievable. COMPARISON: CT head without contrast 09/23/2023. CLINICAL HISTORY: Mental status change, unknown cause. Mental status change; arrives by EMS from home due to call out for patient becoming somnolent while sitting at kitchen table. Family reports patient put her head down as if she was too weak to hold it up. Dementia is baseline, able to recall name. Patient seen urologist ; Thursday 9/11 and was checked for UTI, family reports it was negative and she is prone to getting UTIs. CBG upon arrival 155. FINDINGS: BRAIN AND VENTRICLES: No acute hemorrhage. No evidence of acute infarct. No hydrocephalus. No extra-axial collection. No mass effect or midline shift. Atrophy and white matter changes are moderately advanced for age, stable from the prior study. There are mild lacunar infarcts again noted within the left basal ganglia. ORBITS: No acute abnormality.  SINUSES: No acute abnormality. SOFT TISSUES AND SKULL: No acute soft tissue abnormality. No skull fracture. IMPRESSION: 1. No acute intracranial abnormality. 2. Moderately advanced atrophy and white matter changes, stable from the prior study. 3. Mild lacunar infarcts in the left lentiform nucleus. Electronically signed by: Lonni Necessary MD 03/10/2024 01:31 PM EDT RP Workstation: [id number]    Pertinent labs & imaging results that were available during my care of the patient were  reviewed by me and considered in my medical decision making (see MDM for details).  Medications Ordered in ED Medications  sodium chloride  0.9 % bolus 1,000 mL (0 mLs Intravenous Stopped 03/10/24 1430)                                                                                                                                     Procedures Procedures  (including critical care time)  Medical Decision Making / ED Course   MDM:  78 year old presenting to the emergency department with episode of decreased responsiveness.  Unclear cause of symptoms.  Patient currently well-appearing.  Per husband, patient is at her baseline.  She has no neurologic deficit or other focal abnormality.  Could be related to decreased p.o. intake, husband reports she did not eat dinner or breakfast today.  She does think that the fluids may be helped some as well.  Laboratory testing is overall reassuring without sign of significant electrolyte abnormality, severe anemia, UTI, other toxic or metabolic causes of altered mental status.  CT head was obtained with no evidence of acute process, old stroke.  Neurologic exam is reassuring so doubt acute stroke.  Differential also includes seizure however seems patient was not fully unconscious and had no clear seizure activity, no history of seizures.  Given that patient is back to baseline and her workup is overall reassuring, feel patient is stable for discharge to home.  Will discharge  patient to home. All questions answered. Patient comfortable with plan of discharge. Return precautions discussed with patient and specified on the after visit summary.       Additional history obtained: -Additional history obtained from ems and spouse -External records from outside source obtained and reviewed including: Chart review including previous notes, labs, imaging, consultation notes including prior notes    Lab Tests: -I ordered, reviewed, and interpreted labs.   The pertinent results include:   Labs Reviewed  COMPREHENSIVE METABOLIC PANEL WITH GFR - Abnormal; Notable for the following components:      Result Value   Glucose, Bld 129 (*)    Calcium  8.4 (*)    Total Protein 5.9 (*)    Albumin 3.2 (*)    All other components within normal limits  CBC WITH DIFFERENTIAL/PLATELET - Abnormal; Notable for the following components:   RBC 3.50 (*)    Hemoglobin 11.8 (*)    HCT 34.8 (*)    All other components within normal limits  URINALYSIS, W/ REFLEX TO CULTURE (INFECTION SUSPECTED) - Abnormal; Notable for the following components:   Color, Urine STRAW (*)    All other components within normal limits  CBG MONITORING, ED - Abnormal; Notable for the following components:   Glucose-Capillary 155 (*)    All other components within normal limits  RESP PANEL BY RT-PCR (RSV, FLU A&B, COVID)  RVPGX2  ETHANOL  AMMONIA  TSH    Notable for mild hyperglycemia, mild  anemia   EKG   EKG Interpretation Date/Time:  Saturday March 10 2024 12:09:51 EDT Ventricular Rate:  64 PR Interval:  196 QRS Duration:  149 QT Interval:  445 QTC Calculation: 460 R Axis:   -78  Text Interpretation: Sinus rhythm Premature atrial complexes RBBB and LAFB Confirmed by Francesca Fallow ([id number]) on 03/10/2024 2:23:43 PM         Imaging Studies ordered: I ordered imaging studies including CXR, CT head  On my interpretation imaging demonstrates no acute process I independently visualized and  interpreted imaging. I agree with the radiologist interpretation   Medicines ordered and prescription drug management: Meds ordered this encounter  Medications   sodium chloride  0.9 % bolus 1,000 mL    -I have reviewed the patients home medicines and have made adjustments as needed    Reevaluation: After the interventions noted above, I reevaluated the patient and found that their symptoms have improved  Co morbidities that complicate the patient evaluation  Past Medical History:  Diagnosis Date   Abdominal pain 04/24/2022   Actinic keratosis    Allergic rhinitis due to pollen    Cerebrovascular disease    Dementia due to Alzheimer's disease 09/10/2022   Essential hypertension 04/24/2022   Frequent urinary tract infections    Gastro-esophageal reflux disease without esophagitis    Generalized anxiety disorder    Heartburn    Hematochezia    History of COVID-19    Hyperglycemia    Hyperlipidemia    Hypoalbuminemia due to protein-calorie malnutrition 04/24/2022   Insomnia 04/24/2022   Major depressive disorder    Nonalcoholic steatohepatitis (NASH)    Osteoarthritis of left knee 03/21/2022   Osteoarthritis of right knee 01/25/2021   Overactive bladder    Personal history of colonic polyps    Prediabetes    Pulmonary nodule 04/24/2022   Pure hypercholesterolemia    Right bundle branch block    Rosacea    Sciatica, right side    Slow transit constipation    Starvation ketoacidosis 04/24/2022   UTI (urinary tract infection) 04/24/2022   Vitamin B12 deficiency (non anemic)    Vitamin D  deficiency       Dispostion: Disposition decision including need for hospitalization was considered, and patient discharged from emergency department.    Final Clinical Impression(s) / ED Diagnoses Final diagnoses:  Weakness     This chart was dictated using voice recognition software.  Despite best efforts to proofread,  errors can occur which can change the documentation  meaning.    Francesca Fallow CROME, MD 03/10/24 [phone]

## 2024-03-14 ENCOUNTER — Other Ambulatory Visit (HOSPITAL_BASED_OUTPATIENT_CLINIC_OR_DEPARTMENT_OTHER): Payer: Self-pay | Admitting: Family Medicine

## 2024-03-14 DIAGNOSIS — M8580 Other specified disorders of bone density and structure, unspecified site: Secondary | ICD-10-CM

## 2024-03-15 ENCOUNTER — Other Ambulatory Visit: Payer: Self-pay

## 2024-03-15 ENCOUNTER — Emergency Department (HOSPITAL_COMMUNITY)
Admission: EM | Admit: 2024-03-15 | Discharge: 2024-03-15 | Disposition: A | Discharge status: Home / Self Care | Attending: Emergency Medicine | Admitting: Emergency Medicine

## 2024-03-15 ENCOUNTER — Emergency Department (HOSPITAL_COMMUNITY)

## 2024-03-15 DIAGNOSIS — F0390 Unspecified dementia without behavioral disturbance: Secondary | ICD-10-CM | POA: Insufficient documentation

## 2024-03-15 DIAGNOSIS — R7989 Other specified abnormal findings of blood chemistry: Secondary | ICD-10-CM | POA: Diagnosis not present

## 2024-03-15 DIAGNOSIS — Z0389 Encounter for observation for other suspected diseases and conditions ruled out: Secondary | ICD-10-CM | POA: Diagnosis not present

## 2024-03-15 DIAGNOSIS — F0280 Dementia in other diseases classified elsewhere without behavioral disturbance: Secondary | ICD-10-CM | POA: Diagnosis not present

## 2024-03-15 DIAGNOSIS — K760 Fatty (change of) liver, not elsewhere classified: Secondary | ICD-10-CM | POA: Diagnosis not present

## 2024-03-15 DIAGNOSIS — E785 Hyperlipidemia, unspecified: Secondary | ICD-10-CM | POA: Diagnosis not present

## 2024-03-15 DIAGNOSIS — I959 Hypotension, unspecified: Secondary | ICD-10-CM | POA: Insufficient documentation

## 2024-03-15 DIAGNOSIS — F411 Generalized anxiety disorder: Secondary | ICD-10-CM | POA: Diagnosis not present

## 2024-03-15 DIAGNOSIS — Z7982 Long term (current) use of aspirin: Secondary | ICD-10-CM | POA: Diagnosis not present

## 2024-03-15 DIAGNOSIS — Z87440 Personal history of urinary (tract) infections: Secondary | ICD-10-CM | POA: Insufficient documentation

## 2024-03-15 DIAGNOSIS — Z79899 Other long term (current) drug therapy: Secondary | ICD-10-CM | POA: Insufficient documentation

## 2024-03-15 DIAGNOSIS — Z781 Physical restraint status: Secondary | ICD-10-CM | POA: Diagnosis not present

## 2024-03-15 DIAGNOSIS — R400 Somnolence: Secondary | ICD-10-CM | POA: Diagnosis not present

## 2024-03-15 DIAGNOSIS — G309 Alzheimer's disease, unspecified: Secondary | ICD-10-CM | POA: Diagnosis not present

## 2024-03-15 DIAGNOSIS — I10 Essential (primary) hypertension: Secondary | ICD-10-CM | POA: Diagnosis not present

## 2024-03-15 LAB — COMPREHENSIVE METABOLIC PANEL WITH GFR
ALT: 56 U/L — ABNORMAL HIGH (ref 0–44)
AST: 58 U/L — ABNORMAL HIGH (ref 15–41)
Albumin: 3.6 g/dL (ref 3.5–5.0)
Alkaline Phosphatase: 161 U/L — ABNORMAL HIGH (ref 38–126)
Anion gap: 11 (ref 5–15)
BUN: 14 mg/dL (ref 8–23)
CO2: 26 mmol/L (ref 22–32)
Calcium: 9.5 mg/dL (ref 8.9–10.3)
Chloride: 105 mmol/L (ref 98–111)
Creatinine, Ser: 1.03 mg/dL — ABNORMAL HIGH (ref 0.44–1.00)
GFR, Estimated: 56 mL/min — ABNORMAL LOW (ref >60)
Glucose, Bld: 143 mg/dL — ABNORMAL HIGH (ref 70–99)
Potassium: 4.7 mmol/L (ref 3.5–5.1)
Sodium: 142 mmol/L (ref 135–145)
Total Bilirubin: 1 mg/dL (ref 0.0–1.2)
Total Protein: 6.6 g/dL (ref 6.5–8.1)

## 2024-03-15 LAB — URINALYSIS, W/ REFLEX TO CULTURE (INFECTION SUSPECTED)
Bacteria, UA: NONE SEEN
Bilirubin Urine: NEGATIVE
Glucose, UA: NEGATIVE mg/dL
Hgb urine dipstick: NEGATIVE
Ketones, ur: NEGATIVE mg/dL
Leukocytes,Ua: NEGATIVE
Nitrite: NEGATIVE
Protein, ur: NEGATIVE mg/dL
Specific Gravity, Urine: 1.013 (ref 1.005–1.030)
pH: 6 (ref 5.0–8.0)

## 2024-03-15 LAB — RESP PANEL BY RT-PCR (RSV, FLU A&B, COVID)  RVPGX2
Influenza A by PCR: NEGATIVE
Influenza B by PCR: NEGATIVE
Resp Syncytial Virus by PCR: NEGATIVE
SARS Coronavirus 2 by RT PCR: NEGATIVE

## 2024-03-15 LAB — I-STAT CHEM 8, ED
BUN: 18 mg/dL (ref 8–23)
BUN: 18 mg/dL (ref 8–23)
Calcium, Ion: 1.22 mmol/L (ref 1.15–1.40)
Calcium, Ion: 1.22 mmol/L (ref 1.15–1.40)
Chloride: 107 mmol/L (ref 98–111)
Chloride: 107 mmol/L (ref 98–111)
Creatinine, Ser: 1.1 mg/dL — ABNORMAL HIGH (ref 0.44–1.00)
Creatinine, Ser: 1.1 mg/dL — ABNORMAL HIGH (ref 0.44–1.00)
Glucose, Bld: 137 mg/dL — ABNORMAL HIGH (ref 70–99)
Glucose, Bld: 139 mg/dL — ABNORMAL HIGH (ref 70–99)
HCT: 35 % — ABNORMAL LOW (ref 36.0–46.0)
HCT: 36 % (ref 36.0–46.0)
Hemoglobin: 11.9 g/dL — ABNORMAL LOW (ref 12.0–15.0)
Hemoglobin: 12.2 g/dL (ref 12.0–15.0)
Potassium: 4.7 mmol/L (ref 3.5–5.1)
Potassium: 4.7 mmol/L (ref 3.5–5.1)
Sodium: 142 mmol/L (ref 135–145)
Sodium: 142 mmol/L (ref 135–145)
TCO2: 26 mmol/L (ref 22–32)
TCO2: 27 mmol/L (ref 22–32)

## 2024-03-15 LAB — CBC WITH DIFFERENTIAL/PLATELET
Abs Immature Granulocytes: 0.03 K/uL (ref 0.00–0.07)
Basophils Absolute: 0 K/uL (ref 0.0–0.1)
Basophils Relative: 0 %
Eosinophils Absolute: 0.2 K/uL (ref 0.0–0.5)
Eosinophils Relative: 2 %
HCT: 37.2 % (ref 36.0–46.0)
Hemoglobin: 12.2 g/dL (ref 12.0–15.0)
Immature Granulocytes: 0 %
Lymphocytes Relative: 24 %
Lymphs Abs: 1.9 K/uL (ref 0.7–4.0)
MCH: 32.4 pg (ref 26.0–34.0)
MCHC: 32.8 g/dL (ref 30.0–36.0)
MCV: 98.9 fL (ref 80.0–100.0)
Monocytes Absolute: 0.6 K/uL (ref 0.1–1.0)
Monocytes Relative: 7 %
Neutro Abs: 5.2 K/uL (ref 1.7–7.7)
Neutrophils Relative %: 67 %
Platelets: 255 K/uL (ref 150–400)
RBC: 3.76 MIL/uL — ABNORMAL LOW (ref 3.87–5.11)
RDW: 13.2 % (ref 11.5–15.5)
WBC: 7.9 K/uL (ref 4.0–10.5)
nRBC: 0 % (ref 0.0–0.2)

## 2024-03-15 LAB — I-STAT CG4 LACTIC ACID, ED: Lactic Acid, Venous: 1.4 mmol/L (ref 0.5–1.9)

## 2024-03-15 MED ORDER — SODIUM CHLORIDE 0.9 % IV BOLUS
1000.0000 mL | Freq: Once | INTRAVENOUS | Status: AC
  Administered 2024-03-15: 1000 mL via INTRAVENOUS

## 2024-03-15 MED ORDER — OLANZAPINE 10 MG IM SOLR
5.0000 mg | Freq: Once | INTRAMUSCULAR | Status: AC
  Administered 2024-03-15: 5 mg via INTRAMUSCULAR
  Filled 2024-03-15 (×2): qty 10

## 2024-03-15 MED ORDER — LACTATED RINGERS IV BOLUS
1000.0000 mL | Freq: Once | INTRAVENOUS | Status: AC
  Administered 2024-03-15: 1000 mL via INTRAVENOUS

## 2024-03-15 NOTE — Discharge Instructions (Signed)
 Please be sure to eat and drink plenty of food and liquids.  Your liver enzymes are slightly elevated here.  I would like for you to call your primary care doctor in the morning to schedule follow-up blood work in the coming week.  You may need referral to a specialist if your symptoms continue.  Please return with any new or suddenly worsening symptoms.

## 2024-03-15 NOTE — ED Provider Notes (Signed)
 Blood pressure 128/72, pulse 67, temperature (!) 97.3 F (36.3 C), temperature source Oral, resp. rate 20, SpO2 100%.  Assuming care from Dr. Pamella.  In short, Diana Smith is a 78 y.o. female with a chief complaint of Hypotension .  Refer to the original H&P for additional details.  The current plan of care is to f/u on RUQ US . BP improved. LFTs minimally elevated prompting the US . Likely d/c with normal US .   05:40 PM  Ultrasound shows hepatic steatosis but no other acute findings.  No additional hypotension.  Patient is tolerating PO and stable for discharge.     Darra Fonda MATSU, MD 03/15/24 [phone]

## 2024-03-15 NOTE — ED Triage Notes (Signed)
 Patient BIB spouse from PCP for hypotension, BP at office was 67/32, patient is A&O to baseline, hx of dementia and UTIs

## 2024-03-15 NOTE — ED Notes (Signed)
 Restraints taken off at this time. Family at bedside.

## 2024-03-15 NOTE — ED Provider Notes (Signed)
 Diana Smith Provider Note   CSN: [id number] Arrival date & time: 03/15/24  1156     Patient presents with: Hypotension   Diana Smith is a 78 y.o. female.  With a history of Alzheimer's dementia, hypertension, hyperlipidemia and recurrent UTIs who presents to the ED for hypotension.  Patient was seen for routine visit at her PCP office where she was noted to be hypotensive with systolic blood pressure initially in the 60s.  Some improvement after IV fluids.  Husband reports increased confusion from baseline and voices concern for recurrent UTI.  She has not been febrile.  He also mentions decreased p.o. intake at home both liquids and solids contributing to her weakness.  No recent falls.  Additional history limited to dementia   HPI     Prior to Admission medications   Medication Sig Start Date End Date Taking? Authorizing Provider  acetaminophen  (TYLENOL ) 500 MG tablet Take 500-1,000 mg by mouth every 6 (six) hours as needed for moderate pain (pain score 4-6).   Yes [provider]  ALPRAZolam  (XANAX ) 0.25 MG tablet Take 1 tablet (0.25 mg total) by mouth every 8 (eight) hours as needed for anxiety. 09/24/23  Yes Ricky Fines, MD  aspirin  EC 81 MG tablet Take 1 tablet (81 mg total) by mouth daily. Swallow whole. 12/28/22  Yes Emokpae, Courage, MD  calcium  carbonate (OSCAL) 1500 (600 Ca) MG TABS tablet Take 600 mg of elemental calcium  by mouth in the morning and at bedtime.   Yes [provider]  escitalopram  (LEXAPRO ) 10 MG tablet Take 10 mg by mouth daily.   Yes [provider]  estradiol (ESTRACE) 0.1 MG/GM vaginal cream Place 1 Applicatorful vaginally daily. Every day for 14 days, then twice a week. 11/09/23  Yes [provider]  famotidine  (PEPCID ) 20 MG tablet Take 20 mg by mouth at bedtime. 11/19/23  Yes [provider]  Melatonin 12 MG TABS Take 12 mg by mouth at bedtime.   Yes [provider]  memantine  (NAMENDA ) 10 MG tablet Take 10 mg by mouth at bedtime. 08/06/22  Yes [provider]  midodrine  (PROAMATINE ) 5 MG tablet Take 1 tablet (5 mg total) by mouth 3 (three) times daily with meals. 12/06/23  Yes Dean Clarity, MD  nitrofurantoin , macrocrystal-monohydrate, (MACROBID ) 100 MG capsule Take 100 mg by mouth at bedtime. 03/08/24  Yes [provider]  nystatin cream (MYCOSTATIN) Apply 1 Application topically 2 (two) times daily as needed for dry skin. 11/20/23  Yes [provider]  QUEtiapine  (SEROQUEL ) 25 MG tablet Take 1 tablet (25 mg total) by mouth 2 (two) times daily. 09/24/23  Yes Ricky Fines, MD  rosuvastatin  (CRESTOR ) 20 MG tablet Take 1 tablet (20 mg total) by mouth daily. 12/27/22 03/15/24 Yes Emokpae, Courage, MD  potassium chloride  SA (KLOR-CON  M) 20 MEQ tablet Take 20 mEq by mouth daily. Patient not taking: Reported on 03/15/2024 11/10/23   [provider]    Allergies: Gatifloxacin, Hydrocodone, Keflex  [cephalexin ], Lorabid [loracarbef], and Minocycline    Review of Systems  Updated Vital Signs BP 128/72   Pulse 67   Temp (!) 97.3 F (36.3 C) (Oral)   Resp 20   SpO2 100%   Physical Exam Vitals and nursing note reviewed.  HENT:     Head: Normocephalic and atraumatic.  Eyes:     Pupils: Pupils are equal, round, and reactive to light.  Cardiovascular:     Rate and Rhythm: Normal  rate and regular rhythm.  Pulmonary:     Effort: Pulmonary effort is normal.     Breath sounds: Normal breath sounds.  Abdominal:     Palpations: Abdomen is soft.     Tenderness: There is no abdominal tenderness.  Skin:    General: Skin is warm and dry.  Neurological:     General: No focal deficit present.     Mental Status: She is alert.     Sensory: No sensory deficit.     Motor: No weakness.  Psychiatric:        Mood and Affect: Mood normal.     (all labs ordered are listed, but only abnormal results are displayed) Labs  Reviewed  CBC WITH DIFFERENTIAL/PLATELET - Abnormal; Notable for the following components:      Result Value   RBC 3.76 (*)    All other components within normal limits  COMPREHENSIVE METABOLIC PANEL WITH GFR - Abnormal; Notable for the following components:   Glucose, Bld 143 (*)    Creatinine, Ser 1.03 (*)    AST 58 (*)    ALT 56 (*)    Alkaline Phosphatase 161 (*)    GFR, Estimated 56 (*)    All other components within normal limits  I-STAT CHEM 8, ED - Abnormal; Notable for the following components:   Creatinine, Ser 1.10 (*)    Glucose, Bld 139 (*)    Hemoglobin 11.9 (*)    HCT 35.0 (*)    All other components within normal limits  I-STAT CHEM 8, ED - Abnormal; Notable for the following components:   Creatinine, Ser 1.10 (*)    Glucose, Bld 137 (*)    All other components within normal limits  CULTURE, BLOOD (ROUTINE X 2)  CULTURE, BLOOD (ROUTINE X 2)  RESP PANEL BY RT-PCR (RSV, FLU A&B, COVID)  [id number]  URINALYSIS, W/ REFLEX TO CULTURE (INFECTION SUSPECTED)  I-STAT CG4 LACTIC ACID, ED    EKG: EKG Interpretation Date/Time:  Thursday March 15 2024 12:22:32 EDT Ventricular Rate:  52 PR Interval:  156 QRS Duration:  149 QT Interval:  388 QTC Calculation: 361 R Axis:   264  Text Interpretation: Sinus rhythm RBBB and LAFB Confirmed by Pamella Sharper [phone]) on 03/15/2024 1:24:56 PM  Radiology: [facility] Chest Portable 1 View Result Date: 03/15/2024 CLINICAL DATA:  ?pna. EXAM: PORTABLE CHEST 1 VIEW COMPARISON:  03/10/2024. FINDINGS: Bilateral lung fields are clear. Bilateral costophrenic angles are clear. Stable mildly enlarged cardio-mediastinal silhouette. No acute osseous abnormalities. The soft tissues are within normal limits. IMPRESSION: No active disease. Electronically Signed   By: Ree Molt M.D.   On: 03/15/2024 13:15     Procedures   Medications Ordered in the ED  sodium chloride  0.9 % bolus 1,000 mL (0 mLs Intravenous Stopped 03/15/24 1425)  lactated  ringers  bolus 1,000 mL (1,000 mLs Intravenous New Bag/Given 03/15/24 1357)  OLANZapine  (ZYPREXA ) injection 5 mg (5 mg Intramuscular Given 03/15/24 1515)    Clinical Course as of 03/15/24 1557  Thu Mar 15, 2024  1555 No UTI.  No significant leukocytosis.  LFTs significantly elevated from 5 days ago.  Considering history of hypotension today with this laboratory change will obtain right upper quadrant ultrasound to evaluate for acute cholecystitis as cause of her presentation today  Patient has become more agitated here and required use of restraints and IM Zyprexa  after attempts at verbal de-escalation failed in order to keep her and our staff safe.  LILLETTE Sharper Pamella DO, am transitioning  care of this patient to the oncoming provider pending right upper quadrant ultrasound reevaluation and disposition [MP]    Clinical Course User Index [MP] Pamella Ozell LABOR, DO                                 Medical Decision Making 78 year old female with history as above presented to the ED given concern for hypotension at PCP office.  Potential for recurrent UTI.  Blood pressure improved after IV fluids.  Afebrile.  No other overt source of infection but will obtain infectious workup including chest x-ray UA lab work blood cultures venous lactic.  Most likely source would be urinary given her history of UTIs.  Will continue to monitor pressure closely and provide IV fluids as indicated  Amount and/or Complexity of Data Reviewed Labs: ordered. Radiology: ordered.  Risk Prescription drug management.        Final diagnoses:  Hypotension, unspecified hypotension type  Dementia, unspecified dementia severity, unspecified dementia type, unspecified whether behavioral, psychotic, or mood disturbance or anxiety [facility])    ED Discharge Orders     None          Pamella Ozell LABOR, DO 03/15/24 1557

## 2024-03-16 DIAGNOSIS — I959 Hypotension, unspecified: Secondary | ICD-10-CM | POA: Diagnosis not present

## 2024-03-19 DIAGNOSIS — R945 Abnormal results of liver function studies: Secondary | ICD-10-CM | POA: Diagnosis not present

## 2024-03-20 DIAGNOSIS — G309 Alzheimer's disease, unspecified: Secondary | ICD-10-CM | POA: Diagnosis not present

## 2024-03-20 DIAGNOSIS — F411 Generalized anxiety disorder: Secondary | ICD-10-CM | POA: Diagnosis not present

## 2024-03-20 DIAGNOSIS — F02818 Dementia in other diseases classified elsewhere, unspecified severity, with other behavioral disturbance: Secondary | ICD-10-CM | POA: Diagnosis not present

## 2024-03-20 LAB — CULTURE, BLOOD (ROUTINE X 2)
Culture: NO GROWTH
Culture: NO GROWTH
Special Requests: ADEQUATE

## 2024-03-20 NOTE — Progress Notes (Signed)
 Patient Name: Diana Smith MR#: [id number] DOB: Dec 07, 1945 Date: 03/20/2024 MED CHECK   Patient seen face to face in office    Pt had depression and also early dementia, coming here due to the mood lability and cognitive issues, had been seen by neurology, and had been started on Aricept  but did not tolerate it very well and then I had started Namenda  she has tolerated that quite well.  05/19/2022 Patient with history of cognitive impairment recurring depression and mood lability general anxiety coming here for assessment Been followed by her primary physician and it sounds like it had been maintained on buspirone and Paxil  for some time in part for tending to be somewhat anxious worrying excessively about everyday things and then having proration sometimes repeating herself, and then having lability with episodes of tearfulness disrupted sleep feeling panicky.  She had been seen by neurology through [facility], and it was felt that she was having difficulty learning and accessing new information, that she did have neuropsych assessment that corroborated this She is also had mild depression and anxiety, morbidly and then had a mocha exam on 01/14/2021 in which she scored 17 out of 30.  [name]  She last saw neurology in June 2023 at that time had Aricept  at 10 mg and sound like she was tolerating it okay Their assessment indicates that she was not even sure of her own age thinking she might be 51 when she was 69 Her husband had reported at that time that changes started about 2 years ago and that is when she was started on the Paxil  Also describe repeating the same question for 5 times There is also been some other changes around the house as the patient had managed their household finances until about 4 or 5 years ago when the husband took over and his explanation was that he does that all the computer and it was just too confusing for He also now does most of the dishwashing and the laundry,  and her explanation is that he is retired and that he just gets restless and did not have enough to do They do very little cooking and she does not cook at all anymore as they generally eat out since it is just the 2 of them most of the time Even though she says is just the 2 of them most of the time they still have a 78 year old and 14 year old grandchildren who stay in the house with them. She admits that they have stayed away much of the time lately and she was used to being more active with them and so she no longer has this to keep her busy through the day  She complained of problems with dizziness in the previous weeks and thought it was related to the donepezil  she was also having upset stomach and ended up leaving it off It appears she been on BuSpar and seem like that was less effective for anxiety so she ended up coming off of that and had been started on a tiny dose of Xanax  which has been partially helpful  Then her husband had called a psychiatric service on line apparently or or by telemedicine and sounds like they had recently started her on Lexapro  it seems like her mood is doing better since she has been on the Lexapro  thinking it was going to replace her BuSpar However she is still taking 40 mg of Paxil  daily even with the Lexapro  which normally would not be taking conjointly with another  SSRI.  Quite pleasant during the assessment, initially deferred to her spouse became more spontaneous with speech as the assessment progressed  Of note she did have some significant short-term memory issues during the visit When I tried to get her to repeat 3 of 3 objects immediately, she had difficulty even remembering all 3 for immediate recall, and then she could not remember any of them 3 or 4 minutes later. She was able to spell the word world forwards, but then spelled it incorrectly backwards actually using letters that were not even part of the word world. She did know the current president  and some recent events knew that this is November 2023 and that is close to Thanksgiving She was able to name some simple objects in the office without difficulty She could not explain the meaning of some common proverbs, And was a little concrete trying to explain the meaning of some common objects indicating that apple and orange were both things you could eat and that they are good for you.  06/11/2022 Tondra delightful lady she is quite pleasant during today's assessment and reports she is feeling so much better than she did these last few weeks her mood has been good She has been dealing with the cognitive issues, and I had started her on Namenda  at the initial visit She had been on anticholinesterase inhibitor and did not tolerate that very well. Prior to the last visit she had been started on Lexapro  and her mood seems to do a lot better since being on it She had already been on Paxil  long-term and I had her continue with both at the initial visit with plans to look at reducing the Paxil  depending on the response to the Lexapro . She is also on a very low-dose of Xanax  that she may take as needed and has tolerated this quite well. She is accompanied by her spouse today who also endorses that she seems to been feeling much better once again she is not having the mood lability and excessive worry or the perseveration Spontaneous talking with me in the office today.  08/06/2022 Danne pleasant in the office today She was doing better until about a couple weeks ago and then her husband notes she has been a little more down or anxious, And he wondered whether cutting back on the Paxil  might have impacted this She continues taking the tiny dose of Xanax  as needed and has tolerated it quite well She gets a little sleepy sometimes during the day and may need to take a nap I had started her on Namenda  and increased the dose as she is tolerated it fairly well so far I tried backing down on Paxil   since she is on the Lexapro  hoping to lessen polypharmacy and because the Lexapro  seemed like it been helpful but she is having a little more time with continued lability though the big stressor is her mother who apparently is 35 years old and somewhat oppositional, lives alone apparently is somewhat demanding It sounds like she is a bit of a hoarder based on John's description as well.  10/01/2022 Clio has done better this last couple of months. She still has an occasional time where she will feel a little upset but she is diffuses fairly quickly We had increased the medicine that she is tolerated that we try backing down on the Paxil  to lessen polypharmacy but she did not do quite as well experience more lability But she seems to be doing okay taking  the Lexapro  and the Paxil  together She has some stress dealing with her elderly mother who is 100 and lives alone and can be somewhat demanding at times She is taking the low-dose of Xanax  as needed and is tolerated this quite well Overall her spouse corroborates that she seems to be doing better these last couple of months. Again though doing better means that she is calmer and not having the mood lability, she still is not able to do a lot of things around the house is spouse takes care of most of the laundry ADLs, then they eat out most of the time  12/24/2022 Shakisha to be doing better the last time I saw her She is back to having some trouble today They have a little bit of an erratic sleep schedule though it sounds like she gets enough rest most nights Also open to limit polypharmacy at 1 point backing down on the Paxil  so she is on the Lexapro  but she had increased lability so ended up going back up on it She is also had ongoing stress her elderly mother who is 100 finally has moved to an assisted living and then they have a couple of adult grandchildren that live with and is also a source of stress She has a calendar on the refrigerator  things he has to do and has a hard time keeping up with it Once again I think these issues are really a part of her underlying cognitive difficulties She is taking a tiny dose of Xanax  to 0.25 mg 3 times daily and initially that it made a huge difference but she has been feeling more stressed recently she is also had some stomach concerns and seemed a bit fixated noted she tries to make herself eat And then she also has some trouble expressing her thoughts  Aricept  at 1 point but it sounds like she has been off of it and is not quite clear why it was stopped unless it was contributing to GI issues she is still taking Namenda  10 mg twice daily   03/21/2023 Megon seen by her spouse for the appointment today She is quite pleasant she notes things have done quite well Her husband notes that things have done well the last few months it sounds like she has been less anxious overall sounds like her moods been good She has significant cognitive impairment had frequent word finding issues while in the office today At the last visit I restarted Aricept , but she did not tolerate it due to upset stomach and completely off She continues taking Lexapro  and Paxil  both I do not feel strongly she needs both And then she continues taking Namenda  and then the low-dose of Xanax  and is tolerated these quite well  06/27/2023 Iyonna companied by her spouse to the visit today She is pleasant during the assessment and has some word finding issues which is part of her underlying cognitive disorder She been maintained on Lexapro  and Paxil  and I do not feel strongly she needs both discussed this previously but she done well with this combination ironically though she is not interested in trying other things that might potentially be helpful She is on the Namenda  and has tolerated that fairly well she had tried some anticholinesterase inhibitors did not tolerate the Aricept  previously has not really clear why it was  stopped And she is on the tiny dose of Xanax  that she takes as needed and this seems to made a big difference with her overall  anxiety and lability Her husband indicates that she still has periods of confusion could happen anytime a day when I asked for more specifics on it he defers to her but she is not able to tell me, she seems to lack some insight in this regard her only standard responses that she does not want to take more medicine And then she asked why we want to give her more medicine and it does not sound like she necessarily fully processes what I tried to explain to her  12/19/2023 Vonzella had been at [facility], but her husband brought her home She is doing much better, She smiled during today's visit though but really did not say much of anything required some prompting her husband provided most of the collateral Once again she had been at the nursing home but is now back home with the husband.  It looks like the medicines been changed around again since the last time I saw her I sent some olanzapine  like someone switched her to Seroquel  so she is taking Seroquel  25 mg twice daily now She continues taking the Lexapro  But it appears she is no longer on the Paxil  and I do not have a problem with that as I did not feel strongly she needed both the Paxil  and the Lexapro  She continues taking very low-dose of the Xanax  as needed 0.25 mg up to 3 times a day but it looks like she probably gets it less often than that I did it looks like they had reduced her memantine  dose down to 10 mg daily from 20 mg Not sure what the rationale was for that or if there were adverse side effects Once again though she is not agitated she is pleasant today and previously would often defer to her almost as though he was concerned he would agitate her.  03/20/2024 Jullisa CT doing okay since she has been back home after her husband had taken her from [place]. Overall mood and anxiety have been manageable she  has not had recent agitation, seems to be sleeping well, She has had some trouble with itching in her scalp at times has seen the primary physician was given a shampoo to try but it has not helped a whole lot. Aside from that though she has no complaints today.  She continues taking the Seroquel  25 mg twice daily, the amantadine 10 mg daily and then the Lexapro  10 mg daily she also takes Xanax  as needed 0.25 mg up to 3 times a day but takes this infrequently and a 30-day supply last about 3 months so she is probably taking about 1 tablet a day on average, and her husband corroborates that she does not really need it all the time but it has been helpful when she is needed.  Appropriately during today's assessment   Past medical history Patient Active Problem List  Diagnosis  . Major depressive disorder, recurrent, mild (HCC)  . Dementia, Alzheimer's, with behavior disturbance (HCC)  . Allergic rhinitis due to pollen  . Essential hypertension  . Gastro-esophageal reflux disease without esophagitis  . GAD (generalized anxiety disorder)  . Hyperlipidemia  . Hypoalbuminemia due to protein-calorie malnutrition (HCC)  . Osteoarthritis of right knee  . Overactive bladder  . Pulmonary nodule  . Right bundle branch block  . Rosacea  . Vitamin B12 deficiency (non anemic)  . Vitamin D  deficiency    Pers/soc Married Has 3 children Lives with spouse Pt mother 100 in assisted living  Had managed the  household finances did cooking other activities around the house for many years Spouse took over finances about 4 or 5 years ago Spouse now retired and does most of the laundry and cleaning, They do little cooking tend to eat out most meals  No subs abuse issues  Family history Negative for psychiatric illness  Review of systems Neuro, negative for seizure or positive for lightheadedness Psych neg for anxiety neg for sleep disturbance neg for depression  MSE Appearance  appears stated age  and clean  Behavior  cooperative Motor  No abnormal movements  Speech some delays, paucity, word finding issues, speech seems a little more spontaneous today. Mood euthymic Affect  blunted  Thought process some some delays, a little difficulty expressing herself  Thought content no SI, no hi, no delusions. no halluc Orientation   alert, oriented to situation,  Once again previously scored 17 out of 30 on the mocha and 2022 Also had issues with immediate recall at the time of the initial visit,  [employer] jmt fair  Impression Alzheimer's dementia with behavior disturbance, Major depression recurrent mild Generalized anxiety disorder  It seems pretty clear that patient hasdementia She had a workup through neurology previously as well as neuropsych testing, She has a significant short-term memory difficulties difficulties with retention of new information and recall Has had some mood lability, some perseveration at times, and then only has seen a change in which duties she performs around the house  Overall appears baseline today anxiety manageable not having any agitation recently or mood lability Takes the Xanax  infrequently probably about 1 tablet a day on average,  Will continue with the Seroquel  25 mg twice daily for mood lability and agitation Continue Lexapro  10 mg daily Continue Xanax  0.25 mg up to 3 times a day as needed Once again Namenda  apparently was reduced to 10 mg daily so we will stay at that dose I discussed potential side effects of the medicine and how to reach us  in the interim  Return in 5 months, discussed plan with patient and her spouse who was present during the assessment  I reviewed the [place]  controlled database 03/20/2024 And according to the prescription fill pattern,  it appears the patient has picked up the medication at appropriate times   Orders Placed This Encounter  Medications  . DISCONTD: ALPRAZolam  (XANAX ) 0.25 mg tablet     Sig: Take 1 tab 3 times a day as needed for anxiety    Dispense:  90 tablet    Refill:  2    Please hold until requested  . DISCONTD: escitalopram  (LEXAPRO ) 10 mg tablet    Sig: Take 1 tab daily    Dispense:  30 tablet    Refill:  2    Please hold until requested  . DISCONTD: memantine  (NAMENDA ) 10 mg tablet    Sig: Take  1 tab  daily    Dispense:  30 tablet    Refill:  2    Please hold until requested  . DISCONTD: QUEtiapine  (SEROquel ) 25 mg tablet    Sig: Take 1 tab twice daily    Dispense:  60 tablet    Refill:  2  . escitalopram  (LEXAPRO ) 10 mg tablet    Sig: Take 1 tab daily    Dispense:  30 tablet    Refill:  5    Please hold until requested  . memantine  (NAMENDA ) 10 mg tablet    Sig: Take  1 tab  daily  Dispense:  30 tablet    Refill:  5    Please hold until requested  . ALPRAZolam  (XANAX ) 0.25 mg tablet    Sig: Take 1 tab 3 times a day as needed for anxiety    Dispense:  90 tablet    Refill:  5    Please hold until requested  . QUEtiapine  (SEROquel ) 25 mg tablet    Sig: Take 1 tab twice daily    Dispense:  60 tablet    Refill:  5

## 2024-04-04 DIAGNOSIS — L821 Other seborrheic keratosis: Secondary | ICD-10-CM | POA: Diagnosis not present

## 2024-04-04 DIAGNOSIS — L218 Other seborrheic dermatitis: Secondary | ICD-10-CM | POA: Diagnosis not present

## 2024-04-23 ENCOUNTER — Ambulatory Visit
Admission: EM | Admit: 2024-04-23 | Discharge: 2024-04-23 | Disposition: A | Discharge status: Home / Self Care | Attending: Family Medicine | Admitting: Family Medicine

## 2024-04-23 DIAGNOSIS — N390 Urinary tract infection, site not specified: Secondary | ICD-10-CM | POA: Insufficient documentation

## 2024-04-23 LAB — POCT URINE DIPSTICK
Blood, UA: NEGATIVE
Glucose, UA: NEGATIVE mg/dL
Nitrite, UA: NEGATIVE
POC PROTEIN,UA: 30 — AB
Spec Grav, UA: 1.02 (ref 1.010–1.025)
Urobilinogen, UA: 0.2 U/dL
pH, UA: 5.5 (ref 5.0–8.0)

## 2024-04-23 MED ORDER — SULFAMETHOXAZOLE-TRIMETHOPRIM 800-160 MG PO TABS: 1.0000 | ORAL_TABLET | Freq: Two times a day (BID) | ORAL | 0 refills | Status: AC

## 2024-04-23 NOTE — Discharge Instructions (Signed)
 Continue the prescribed medications from the urologist, drink plenty of fluids, empty your bladder fully and frequently.  Take the prescribed antibiotic and we will give you a call if your urine culture let us  know that we need to make any changes to your medication.

## 2024-04-23 NOTE — ED Provider Notes (Signed)
 [facility]    CSN: [id number] Arrival date & time: 04/23/24  1445      History   Chief Complaint Chief Complaint  Patient presents with   Dysuria    HPI Diana Smith is a 78 y.o. female.   Pt being seen in UC for strong odor with urination, urinary frequency, and confusion. Pt family reports s/s started approximately 3 days ago. Pt denies fever and n/v.       Past Medical History:  Diagnosis Date   Abdominal pain 04/24/2022   Actinic keratosis    Allergic rhinitis due to pollen    Cerebrovascular disease    Dementia due to Alzheimer's disease 09/10/2022   Essential hypertension 04/24/2022   Frequent urinary tract infections    Gastro-esophageal reflux disease without esophagitis    Generalized anxiety disorder    Heartburn    Hematochezia    History of COVID-19    Hyperglycemia    Hyperlipidemia    Hypoalbuminemia due to protein-calorie malnutrition 04/24/2022   Insomnia 04/24/2022   Major depressive disorder    Nonalcoholic steatohepatitis (NASH)    Osteoarthritis of left knee 03/21/2022   Osteoarthritis of right knee 01/25/2021   Overactive bladder    Personal history of colonic polyps    Prediabetes    Pulmonary nodule 04/24/2022   Pure hypercholesterolemia    Right bundle branch block    Rosacea    Sciatica, right side    Slow transit constipation    Starvation ketoacidosis 04/24/2022   UTI (urinary tract infection) 04/24/2022   Vitamin B12 deficiency (non anemic)    Vitamin D  deficiency     Patient Active Problem List   Diagnosis Date Noted   Weakness 09/24/2023   Confusion 09/24/2023   Hypotension 09/23/2023   History of ischemic stroke 09/23/2023   Acute encephalopathy 09/23/2023   TIA (transient ischemic attack) 12/26/2022   Dementia due to Alzheimer's disease 09/10/2022   Abdominal pain 04/24/2022   Hypoalbuminemia due to protein-calorie malnutrition 04/24/2022   Pulmonary nodule 04/24/2022   Essential hypertension  04/24/2022   Insomnia 04/24/2022   Actinic keratosis    Allergic rhinitis due to pollen    Cerebrovascular disease    History of COVID-19    Gastro-esophageal reflux disease without esophagitis    Generalized anxiety disorder    Hyperglycemia    Hyperlipidemia    Major depressive disorder    Nonalcoholic steatohepatitis (NASH)    Overactive bladder    History of colonic polyps    Prediabetes    Right bundle branch block    Rosacea    Sciatica, right side    Slow transit constipation    Vitamin B12 deficiency (non anemic)    Vitamin D  deficiency    Osteoarthritis of right knee 01/25/2021    Past Surgical History:  Procedure Laterality Date   HYSTEROTOMY     MASS EXCISION  10/08/2011   Procedure: MINOR EXCISION OF MASS;  Surgeon: Lamar LULLA Leonor Mickey., MD;  Location: [facility];  Service: Orthopedics;  Laterality: Left;  excisional biopsy dorsum left long finger MCP joint    OB History   No obstetric history on file.      Home Medications    Prior to Admission medications   Medication Sig Start Date End Date Taking? Authorizing Provider  sulfamethoxazole -trimethoprim  (BACTRIM  DS) 800-160 MG tablet Take 1 tablet by mouth 2 (two) times daily for 3 days. 04/23/24 04/26/24 Yes Stuart Vernell Norris, PA-C  acetaminophen  (TYLENOL )  500 MG tablet Take 500-1,000 mg by mouth every 6 (six) hours as needed for moderate pain (pain score 4-6).    [provider]  ALPRAZolam  (XANAX ) 0.25 MG tablet Take 1 tablet (0.25 mg total) by mouth every 8 (eight) hours as needed for anxiety. 09/24/23   Ricky Fines, MD  aspirin  EC 81 MG tablet Take 1 tablet (81 mg total) by mouth daily. Swallow whole. 12/28/22   Pearlean Manus, MD  calcium  carbonate (OSCAL) 1500 (600 Ca) MG TABS tablet Take 600 mg of elemental calcium  by mouth in the morning and at bedtime.    [provider]  escitalopram  (LEXAPRO ) 10 MG tablet Take 10 mg by mouth daily.    [provider]   estradiol (ESTRACE) 0.1 MG/GM vaginal cream Place 1 Applicatorful vaginally daily. Every day for 14 days, then twice a week. 11/09/23   [provider]  famotidine  (PEPCID ) 20 MG tablet Take 20 mg by mouth at bedtime. 11/19/23   [provider]  Melatonin 12 MG TABS Take 12 mg by mouth at bedtime.    [provider]  memantine  (NAMENDA ) 10 MG tablet Take 10 mg by mouth at bedtime. 08/06/22   [provider]  midodrine  (PROAMATINE ) 5 MG tablet Take 1 tablet (5 mg total) by mouth 3 (three) times daily with meals. 12/06/23   Dean Clarity, MD  nitrofurantoin , macrocrystal-monohydrate, (MACROBID ) 100 MG capsule Take 100 mg by mouth at bedtime. 03/08/24   [provider]  nystatin cream (MYCOSTATIN) Apply 1 Application topically 2 (two) times daily as needed for dry skin. 11/20/23   [provider]  potassium chloride  SA (KLOR-CON  M) 20 MEQ tablet Take 20 mEq by mouth daily. Patient not taking: Reported on 03/15/2024 11/10/23   [provider]  QUEtiapine  (SEROQUEL ) 25 MG tablet Take 1 tablet (25 mg total) by mouth 2 (two) times daily. 09/24/23   Ricky Fines, MD  rosuvastatin  (CRESTOR ) 20 MG tablet Take 1 tablet (20 mg total) by mouth daily. 12/27/22 03/15/24  Pearlean Manus, MD    Family History Family History  Problem Relation Age of Onset   Dementia Father    Memory loss Father    Breast cancer Neg Hx     Social History Social History   Tobacco Use   Smoking status: Never   Smokeless tobacco: Never  Vaping Use   Vaping status: Never Used  Substance Use Topics   Alcohol use: Never   Drug use: Never     Allergies   Gatifloxacin, Hydrocodone, Keflex  [cephalexin ], Lorabid [loracarbef], and Minocycline   Review of Systems Review of Systems Per HPI  Physical Exam Triage Vital Signs ED Triage Vitals  Encounter Vitals Group     BP 04/23/24 1654 109/70     Girls Systolic BP Percentile --      Girls Diastolic BP  Percentile --      Boys Systolic BP Percentile --      Boys Diastolic BP Percentile --      Pulse Rate 04/23/24 1654 (!) 56     Resp 04/23/24 1654 16     Temp 04/23/24 1654 97.6 F (36.4 C)     Temp Source 04/23/24 1654 Oral     SpO2 04/23/24 1654 94 %     Weight --      Height --      Head Circumference --      Peak Flow --      Pain Score 04/23/24 1648 0  Pain Loc --      Pain Education --      Exclude from Growth Chart --    No data found.  Updated Vital Signs BP 109/70 (BP Location: Right Arm)   Pulse (!) 56   Temp 97.6 F (36.4 C) (Oral)   Resp 16   SpO2 94%   Visual Acuity Right Eye Distance:   Left Eye Distance:   Bilateral Distance:    Right Eye Near:   Left Eye Near:    Bilateral Near:     Physical Exam Vitals and nursing note reviewed.  Constitutional:      Appearance: Normal appearance. She is not ill-appearing.  HENT:     Head: Atraumatic.     Mouth/Throat:     Mouth: Mucous membranes are moist.  Eyes:     Extraocular Movements: Extraocular movements intact.     Conjunctiva/sclera: Conjunctivae normal.  Cardiovascular:     Rate and Rhythm: Normal rate.  Pulmonary:     Effort: Pulmonary effort is normal.  Abdominal:     General: Bowel sounds are normal. There is no distension.     Palpations: Abdomen is soft.     Tenderness: There is no abdominal tenderness. There is no right CVA tenderness, left CVA tenderness or guarding.  Musculoskeletal:        General: Normal range of motion.     Cervical back: Normal range of motion and neck supple.  Skin:    General: Skin is warm and dry.  Neurological:     Mental Status: She is alert. Mental status is at baseline.  Psychiatric:        Mood and Affect: Mood normal.        Thought Content: Thought content normal.        Judgment: Judgment normal.      UC Treatments / Results  Labs (all labs ordered are listed, but only abnormal results are displayed) Labs Reviewed  POCT URINE DIPSTICK -  Abnormal; Notable for the following components:      Result Value   Color, UA straw (*)    Clarity, UA cloudy (*)    Bilirubin, UA small (*)    Ketones, POC UA trace (5) (*)    POC PROTEIN,UA =30 (*)    Leukocytes, UA Large (3+) (*)    All other components within normal limits  URINE CULTURE    EKG   Radiology No results found.  Procedures Procedures (including critical care time)  Medications Ordered in UC Medications - No data to display  Initial Impression / Assessment and Plan / UC Course  I have reviewed the triage vital signs and the nursing notes.  Pertinent labs & imaging results that were available during my care of the patient were reviewed by me and considered in my medical decision making (see chart for details).     Urinalysis with evidence of urinary tract infection which is a recurrent issue for her.  Being seen by urology, continue medications as prescribed by urologist and start Bactrim , fluids.  Urine culture pending.  Return for worsening or unresolving symptoms.  Final Clinical Impressions(s) / UC Diagnoses   Final diagnoses:  Acute lower UTI     Discharge Instructions      Continue the prescribed medications from the urologist, drink plenty of fluids, empty your bladder fully and frequently.  Take the prescribed antibiotic and we will give you a call if your urine culture let us  know that we need to  make any changes to your medication.    ED Prescriptions     Medication Sig Dispense Auth. Provider   sulfamethoxazole -trimethoprim  (BACTRIM  DS) 800-160 MG tablet Take 1 tablet by mouth 2 (two) times daily for 3 days. 6 tablet Stuart Vernell Norris, [place]      PDMP not reviewed this encounter.   Stuart Vernell Norris, [place] 04/23/24 1721

## 2024-04-23 NOTE — ED Triage Notes (Signed)
 Pt being seen in UC for strong odor with urination, urinary frequency, and confusion. Pt family reports s/s started approximately 3 days ago. Pt denies fever and n/v.

## 2024-04-25 ENCOUNTER — Ambulatory Visit (HOSPITAL_COMMUNITY): Payer: Self-pay

## 2024-04-25 LAB — URINE CULTURE: Culture: NO GROWTH

## 2024-05-22 ENCOUNTER — Emergency Department (HOSPITAL_COMMUNITY)

## 2024-05-22 ENCOUNTER — Encounter (HOSPITAL_COMMUNITY): Payer: Self-pay | Admitting: Emergency Medicine

## 2024-05-22 ENCOUNTER — Emergency Department (HOSPITAL_COMMUNITY)
Admission: EM | Admit: 2024-05-22 | Discharge: 2024-05-22 | Disposition: A | Discharge status: Home / Self Care | Source: Home / Self Care | Attending: Emergency Medicine | Admitting: Emergency Medicine

## 2024-05-22 ENCOUNTER — Encounter (HOSPITAL_COMMUNITY): Payer: Self-pay

## 2024-05-22 ENCOUNTER — Other Ambulatory Visit: Payer: Self-pay

## 2024-05-22 DIAGNOSIS — R001 Bradycardia, unspecified: Secondary | ICD-10-CM | POA: Diagnosis not present

## 2024-05-22 DIAGNOSIS — Z8616 Personal history of COVID-19: Secondary | ICD-10-CM | POA: Insufficient documentation

## 2024-05-22 DIAGNOSIS — R7303 Prediabetes: Secondary | ICD-10-CM | POA: Insufficient documentation

## 2024-05-22 DIAGNOSIS — I10 Essential (primary) hypertension: Secondary | ICD-10-CM | POA: Diagnosis not present

## 2024-05-22 DIAGNOSIS — F29 Unspecified psychosis not due to a substance or known physiological condition: Secondary | ICD-10-CM | POA: Diagnosis not present

## 2024-05-22 DIAGNOSIS — Z7982 Long term (current) use of aspirin: Secondary | ICD-10-CM | POA: Insufficient documentation

## 2024-05-22 DIAGNOSIS — F03B11 Unspecified dementia, moderate, with agitation: Secondary | ICD-10-CM

## 2024-05-22 DIAGNOSIS — Z8673 Personal history of transient ischemic attack (TIA), and cerebral infarction without residual deficits: Secondary | ICD-10-CM | POA: Insufficient documentation

## 2024-05-22 DIAGNOSIS — R0902 Hypoxemia: Secondary | ICD-10-CM | POA: Diagnosis not present

## 2024-05-22 DIAGNOSIS — R410 Disorientation, unspecified: Secondary | ICD-10-CM | POA: Diagnosis not present

## 2024-05-22 DIAGNOSIS — Z79899 Other long term (current) drug therapy: Secondary | ICD-10-CM | POA: Insufficient documentation

## 2024-05-22 DIAGNOSIS — S199XXA Unspecified injury of neck, initial encounter: Secondary | ICD-10-CM | POA: Diagnosis not present

## 2024-05-22 DIAGNOSIS — F02B11 Dementia in other diseases classified elsewhere, moderate, with agitation: Secondary | ICD-10-CM | POA: Insufficient documentation

## 2024-05-22 DIAGNOSIS — M4802 Spinal stenosis, cervical region: Secondary | ICD-10-CM | POA: Diagnosis not present

## 2024-05-22 DIAGNOSIS — R519 Headache, unspecified: Secondary | ICD-10-CM | POA: Insufficient documentation

## 2024-05-22 DIAGNOSIS — R4182 Altered mental status, unspecified: Secondary | ICD-10-CM | POA: Diagnosis not present

## 2024-05-22 DIAGNOSIS — W19XXXA Unspecified fall, initial encounter: Secondary | ICD-10-CM | POA: Diagnosis not present

## 2024-05-22 DIAGNOSIS — M47812 Spondylosis without myelopathy or radiculopathy, cervical region: Secondary | ICD-10-CM | POA: Diagnosis not present

## 2024-05-22 LAB — COMPREHENSIVE METABOLIC PANEL WITH GFR
ALT: 14 U/L (ref 0–44)
AST: 26 U/L (ref 15–41)
Albumin: 4.5 g/dL (ref 3.5–5.0)
Alkaline Phosphatase: 116 U/L (ref 38–126)
Anion gap: 13 (ref 5–15)
BUN: 12 mg/dL (ref 8–23)
CO2: 21 mmol/L — ABNORMAL LOW (ref 22–32)
Calcium: 10 mg/dL (ref 8.9–10.3)
Chloride: 109 mmol/L (ref 98–111)
Creatinine, Ser: 0.93 mg/dL (ref 0.44–1.00)
GFR, Estimated: >60 mL/min (ref >60)
Glucose, Bld: 101 mg/dL — ABNORMAL HIGH (ref 70–99)
Potassium: 4.2 mmol/L (ref 3.5–5.1)
Sodium: 143 mmol/L (ref 135–145)
Total Bilirubin: 0.7 mg/dL (ref 0.0–1.2)
Total Protein: 7.3 g/dL (ref 6.5–8.1)

## 2024-05-22 LAB — CBC WITH DIFFERENTIAL/PLATELET
Abs Immature Granulocytes: 0.05 K/uL (ref 0.00–0.07)
Basophils Absolute: 0 K/uL (ref 0.0–0.1)
Basophils Relative: 0 %
Eosinophils Absolute: 0 K/uL (ref 0.0–0.5)
Eosinophils Relative: 0 %
HCT: 39.2 % (ref 36.0–46.0)
Hemoglobin: 13.5 g/dL (ref 12.0–15.0)
Immature Granulocytes: 1 %
Lymphocytes Relative: 19 %
Lymphs Abs: 2 K/uL (ref 0.7–4.0)
MCH: 33 pg (ref 26.0–34.0)
MCHC: 34.4 g/dL (ref 30.0–36.0)
MCV: 95.8 fL (ref 80.0–100.0)
Monocytes Absolute: 0.7 K/uL (ref 0.1–1.0)
Monocytes Relative: 7 %
Neutro Abs: 7.6 K/uL (ref 1.7–7.7)
Neutrophils Relative %: 73 %
Platelets: 255 K/uL (ref 150–400)
RBC: 4.09 MIL/uL (ref 3.87–5.11)
RDW: 12.5 % (ref 11.5–15.5)
WBC: 10.4 K/uL (ref 4.0–10.5)
nRBC: 0 % (ref 0.0–0.2)

## 2024-05-22 LAB — RESP PANEL BY RT-PCR (RSV, FLU A&B, COVID)  RVPGX2
Influenza A by PCR: NEGATIVE
Influenza B by PCR: NEGATIVE
Resp Syncytial Virus by PCR: NEGATIVE
SARS Coronavirus 2 by RT PCR: NEGATIVE

## 2024-05-22 LAB — URINALYSIS, ROUTINE W REFLEX MICROSCOPIC
Bilirubin Urine: NEGATIVE
Glucose, UA: NEGATIVE mg/dL
Hgb urine dipstick: NEGATIVE
Ketones, ur: NEGATIVE mg/dL
Leukocytes,Ua: NEGATIVE
Nitrite: NEGATIVE
Protein, ur: NEGATIVE mg/dL
Specific Gravity, Urine: 1.008 (ref 1.005–1.030)
pH: 8 (ref 5.0–8.0)

## 2024-05-22 LAB — TROPONIN T, HIGH SENSITIVITY
Troponin T High Sensitivity: <15 ng/L (ref 0–19)
Troponin T High Sensitivity: <15 ng/L (ref 0–19)

## 2024-05-22 LAB — AMMONIA: Ammonia: 18 umol/L (ref 9–35)

## 2024-05-22 LAB — LIPASE, BLOOD: Lipase: 23 U/L (ref 11–51)

## 2024-05-22 LAB — TSH: TSH: 4.42 u[IU]/mL (ref 0.350–4.500)

## 2024-05-22 LAB — T4, FREE: Free T4: 1.3 ng/dL — ABNORMAL HIGH (ref 0.61–1.12)

## 2024-05-22 MED ORDER — LORAZEPAM 2 MG/ML IJ SOLN
1.0000 mg | Freq: Once | INTRAMUSCULAR | Status: AC
  Administered 2024-05-22: 1 mg via INTRAVENOUS
  Filled 2024-05-22: qty 1

## 2024-05-22 NOTE — ED Triage Notes (Signed)
 Pt arrived via [facility] from home after Pts husband called reporting increased confusion/AMS and reports Pt has been falling multiple times at home while trying to get around. During Triage, Pt observed rambling non-sensical phrases, unable to follow commands or keep her eye open. Pt disoriented X 4.

## 2024-05-22 NOTE — ED Notes (Signed)
 Pts husband at bedside to provide collateral information and help calm the Pt down at this time. Pt changed into hospital gown and was noted to have skin breakdown in and around her [place] region.

## 2024-05-22 NOTE — Discharge Instructions (Signed)
 It was a pleasure caring for you today in the emergency department.  Please follow-up with primary care doctor in the next week for recheck, he may benefit from having home health in the house or considering facility placement  Please return to the emergency department for any worsening or worrisome symptoms.

## 2024-05-22 NOTE — ED Provider Notes (Signed)
 [place] EMERGENCY DEPARTMENT AT [facility] Provider Note  CSN: [id number] Arrival date & time: 05/22/24 0848  Chief Complaint(s) Altered Mental Status  HPI Diana Smith is a 78 y.o. female with past medical history as below, significant for dementia, GAD, GERD, HLD, NASH, who presents to the ED with complaint of confusion, freq falls  Is here with spouse, patient reportedly got up in the middle the night last night and had an unwitnessed fall.  Concerned that she struck the back of her head.  Spouse is able to direct the patient back to her bed limiting sleep.  This morning patient had progressively worsening agitation, confusion.  Spouse reports that this happened previously and does improve with Xanax ..  Patient is complaining of headache.  No chest pain or abdominal pain nausea or vomiting.  No fevers , chills, recent travel or sick contacts.  Normal state of health prior to her fall last night.  She has intermittent falls, intermittent confusion associate with her dementia.  Has not yet had her morning medication.  Not anticoagulated.  Denies LOC  Past Medical History Past Medical History:  Diagnosis Date   Abdominal pain 04/24/2022   Actinic keratosis    Allergic rhinitis due to pollen    Cerebrovascular disease    Dementia due to Alzheimer's disease 09/10/2022   Essential hypertension 04/24/2022   Frequent urinary tract infections    Gastro-esophageal reflux disease without esophagitis    Generalized anxiety disorder    Heartburn    Hematochezia    History of COVID-19    Hyperglycemia    Hyperlipidemia    Hypoalbuminemia due to protein-calorie malnutrition 04/24/2022   Insomnia 04/24/2022   Major depressive disorder    Nonalcoholic steatohepatitis (NASH)    Osteoarthritis of left knee 03/21/2022   Osteoarthritis of right knee 01/25/2021   Overactive bladder    Personal history of colonic polyps    Prediabetes    Pulmonary nodule 04/24/2022   Pure  hypercholesterolemia    Right bundle branch block    Rosacea    Sciatica, right side    Slow transit constipation    Starvation ketoacidosis 04/24/2022   UTI (urinary tract infection) 04/24/2022   Vitamin B12 deficiency (non anemic)    Vitamin D  deficiency    Patient Active Problem List   Diagnosis Date Noted   Weakness 09/24/2023   Confusion 09/24/2023   Hypotension 09/23/2023   History of ischemic stroke 09/23/2023   Acute encephalopathy 09/23/2023   TIA (transient ischemic attack) 12/26/2022   Dementia due to Alzheimer's disease 09/10/2022   Abdominal pain 04/24/2022   Hypoalbuminemia due to protein-calorie malnutrition 04/24/2022   Pulmonary nodule 04/24/2022   Essential hypertension 04/24/2022   Insomnia 04/24/2022   Actinic keratosis    Allergic rhinitis due to pollen    Cerebrovascular disease    History of COVID-19    Gastro-esophageal reflux disease without esophagitis    Generalized anxiety disorder    Hyperglycemia    Hyperlipidemia    Major depressive disorder    Nonalcoholic steatohepatitis (NASH)    Overactive bladder    History of colonic polyps    Prediabetes    Right bundle branch block    Rosacea    Sciatica, right side    Slow transit constipation    Vitamin B12 deficiency (non anemic)    Vitamin D  deficiency    Osteoarthritis of right knee 01/25/2021   Home Medication(s) Prior to Admission medications   Medication Sig Start Date  End Date Taking? Authorizing Provider  acetaminophen  (TYLENOL ) 500 MG tablet Take 500-1,000 mg by mouth every 6 (six) hours as needed for moderate pain (pain score 4-6).    [provider]  ALPRAZolam  (XANAX ) 0.25 MG tablet Take 1 tablet (0.25 mg total) by mouth every 8 (eight) hours as needed for anxiety. 09/24/23   Ricky Fines, MD  aspirin  EC 81 MG tablet Take 1 tablet (81 mg total) by mouth daily. Swallow whole. 12/28/22   Pearlean Manus, MD  calcium  carbonate (OSCAL) 1500 (600 Ca) MG TABS tablet Take 600 mg  of elemental calcium  by mouth in the morning and at bedtime.    [provider]  escitalopram  (LEXAPRO ) 10 MG tablet Take 10 mg by mouth daily.    [provider]  estradiol (ESTRACE) 0.1 MG/GM vaginal cream Place 1 Applicatorful vaginally daily. Every day for 14 days, then twice a week. 11/09/23   [provider]  famotidine  (PEPCID ) 20 MG tablet Take 20 mg by mouth at bedtime. 11/19/23   [provider]  Melatonin 12 MG TABS Take 12 mg by mouth at bedtime.    [provider]  memantine  (NAMENDA ) 10 MG tablet Take 10 mg by mouth at bedtime. 08/06/22   [provider]  midodrine  (PROAMATINE ) 5 MG tablet Take 1 tablet (5 mg total) by mouth 3 (three) times daily with meals. 12/06/23   Dean Clarity, MD  nitrofurantoin , macrocrystal-monohydrate, (MACROBID ) 100 MG capsule Take 100 mg by mouth at bedtime. 03/08/24   [provider]  nystatin  cream (MYCOSTATIN ) Apply 1 Application topically 2 (two) times daily as needed for dry skin. 11/20/23   [provider]  potassium chloride  SA (KLOR-CON  M) 20 MEQ tablet Take 20 mEq by mouth daily. Patient not taking: Reported on 03/15/2024 11/10/23   [provider]  QUEtiapine  (SEROQUEL ) 25 MG tablet Take 1 tablet (25 mg total) by mouth 2 (two) times daily. 09/24/23   Ricky Fines, MD  rosuvastatin  (CRESTOR ) 20 MG tablet Take 1 tablet (20 mg total) by mouth daily. 12/27/22 03/15/24  Pearlean Manus, MD                                                                                                                                    Past Surgical History Past Surgical History:  Procedure Laterality Date   HYSTEROTOMY     MASS EXCISION  10/08/2011   Procedure: MINOR EXCISION OF MASS;  Surgeon: Lamar LULLA Leonor Mickey., MD;  Location: [facility];  Service: Orthopedics;  Laterality: Left;  excisional biopsy dorsum left long finger MCP joint   Family History Family History  Problem  Relation Age of Onset   Dementia Father    Memory loss Father    Breast cancer Neg Hx     Social History Social History   Tobacco Use   Smoking status: Never   Smokeless tobacco: Never  Vaping Use  Vaping status: Never Used  Substance Use Topics   Alcohol use: Never   Drug use: Never   Allergies Gatifloxacin, Hydrocodone, Keflex  [cephalexin ], Lorabid [loracarbef], and Minocycline  Review of Systems A thorough review of systems was obtained and all systems are negative except as noted in the HPI and PMH.   Physical Exam Vital Signs  I have reviewed the triage vital signs BP (!) 147/69 (BP Location: Left Arm)   Pulse 66   Temp (!) 97.1 F (36.2 C) (Axillary)   Resp 20   Ht 5' 5 (1.651 m)   Wt 77.1 kg   SpO2 100%   BMI 28.29 kg/m  Physical Exam Vitals and nursing note reviewed.  Constitutional:      General: She is not in acute distress.    Appearance: Normal appearance. She is well-developed. She is not ill-appearing.  HENT:     Head: Normocephalic and atraumatic.     Right Ear: External ear normal.     Left Ear: External ear normal.     Nose: Nose normal.     Mouth/Throat:     Mouth: Mucous membranes are moist.  Eyes:     General: No scleral icterus.       Right eye: No discharge.        Left eye: No discharge.     Extraocular Movements: Extraocular movements intact.     Pupils: Pupils are equal, round, and reactive to light.  Cardiovascular:     Rate and Rhythm: Normal rate.  Pulmonary:     Effort: Pulmonary effort is normal. No respiratory distress.     Breath sounds: No stridor.  Abdominal:     General: Abdomen is flat. There is no distension.     Palpations: Abdomen is soft.     Tenderness: There is no guarding.   Musculoskeletal:        General: No deformity.     Cervical back: No rigidity.  Skin:    General: Skin is warm and dry.     Coloration: Skin is not cyanotic, jaundiced or pale.  Neurological:     Mental Status: She is disoriented  and confused.     GCS: GCS eye subscore is 4. GCS verbal subscore is 4. GCS motor subscore is 6.     Cranial Nerves: Cranial nerves 2-12 are intact. No dysarthria or facial asymmetry.     Sensory: Sensation is intact. No sensory deficit.     Motor: Motor function is intact. No weakness.     Comments: Gait testing deferred secondary to patient safety. Strength 5/5 to BLUE/BLLE, equal and symmetric    Psychiatric:        Speech: Speech normal.        Behavior: Behavior normal. Behavior is cooperative.     ED Results and Treatments Labs (all labs ordered are listed, but only abnormal results are displayed) Labs Reviewed  URINALYSIS, ROUTINE W REFLEX MICROSCOPIC  Radiology No results found.  Pertinent labs & imaging results that were available during my care of the patient were reviewed by me and considered in my medical decision making (see MDM for details).  Medications Ordered in ED Medications - No data to display                                                                                                                                   Procedures Procedures  (including critical care time)  Medical Decision Making / ED Course    Medical Decision Making:    WILMARY LEVIT is a 78 y.o. female  with past medical history as below, significant for dementia, GAD, GERD, HLD, NASH, who presents to the ED with complaint of confusion, freq falls. The complaint involves an extensive differential diagnosis and also carries with it a high risk of complications and morbidity.  Serious etiology was considered. Ddx includes but is not limited to: Differential diagnoses for altered mental status includes but is not exclusive to alcohol, illicit or prescription medications, intracranial pathology such as stroke, intracerebral hemorrhage, fever or infectious causes  including sepsis, hypoxemia, uremia, trauma, endocrine related disorders such as diabetes, hypoglycemia, thyroid -related diseases, etc.   Complete initial physical exam performed, notably the patient was in no acute distress, appears confused.    Reviewed and confirmed nursing documentation for past medical history, family history, social history.  Vital signs reviewed.    Ground-level fall with head injury AMS > - History of dementia, intermittent agitation, confusion typically does improve with Xanax  per spouse - Fall this morning with head injury, anticoagulated       ***               Additional history obtained: -Additional history obtained from {[email or web address]:28072} -External records from outside source obtained and reviewed including: Chart review including previous notes, labs, imaging, consultation notes including  ***   Lab Tests: -I ordered, reviewed, and interpreted labs.   The pertinent results include:   Labs Reviewed  URINALYSIS, ROUTINE W REFLEX MICROSCOPIC    Notable for ***  EKG   EKG Interpretation Date/Time:    Ventricular Rate:    PR Interval:    QRS Duration:    QT Interval:    QTC Calculation:   R Axis:      Text Interpretation:           Imaging Studies ordered: I ordered imaging studies including *** I independently visualized the following imaging with scope of interpretation limited to determining acute life threatening conditions related to emergency care; findings noted above I agree with the radiologist interpretation If any imaging was obtained with contrast I closely monitored patient for any possible adverse reaction a/w contrast administration in the emergency department   Medicines ordered and prescription drug management: No orders of the defined types were placed in this encounter.   -I have reviewed the patients home medicines  and have made adjustments as needed   Consultations Obtained: I  requested consultation with the ***,  and discussed lab and imaging findings as well as pertinent plan - they recommend: ***   Cardiac Monitoring: The patient was maintained on a cardiac monitor.  I personally viewed and interpreted the cardiac monitored which showed an underlying rhythm of: *** Continuous pulse oximetry interpreted by myself, ***% on ***.    Social Determinants of Health:  Diagnosis or treatment significantly limited by social determinants of health: {wssoc:28071}   Reevaluation: After the interventions noted above, I reevaluated the patient and found that they have {resolved/improved/worsened:23923::improved}  Co morbidities that complicate the patient evaluation  Past Medical History:  Diagnosis Date   Abdominal pain 04/24/2022   Actinic keratosis    Allergic rhinitis due to pollen    Cerebrovascular disease    Dementia due to Alzheimer's disease 09/10/2022   Essential hypertension 04/24/2022   Frequent urinary tract infections    Gastro-esophageal reflux disease without esophagitis    Generalized anxiety disorder    Heartburn    Hematochezia    History of COVID-19    Hyperglycemia    Hyperlipidemia    Hypoalbuminemia due to protein-calorie malnutrition 04/24/2022   Insomnia 04/24/2022   Major depressive disorder    Nonalcoholic steatohepatitis (NASH)    Osteoarthritis of left knee 03/21/2022   Osteoarthritis of right knee 01/25/2021   Overactive bladder    Personal history of colonic polyps    Prediabetes    Pulmonary nodule 04/24/2022   Pure hypercholesterolemia    Right bundle branch block    Rosacea    Sciatica, right side    Slow transit constipation    Starvation ketoacidosis 04/24/2022   UTI (urinary tract infection) 04/24/2022   Vitamin B12 deficiency (non anemic)    Vitamin D  deficiency       Dispostion: Disposition decision including need for hospitalization was considered, and patient {wsdispo:28070::discharged from emergency  department.}    Final Clinical Impression(s) / ED Diagnoses Final diagnoses:  None

## 2024-05-23 ENCOUNTER — Emergency Department (HOSPITAL_COMMUNITY)

## 2024-05-23 ENCOUNTER — Inpatient Hospital Stay (HOSPITAL_COMMUNITY)
Admission: EM | Admit: 2024-05-23 | Discharge: 2024-05-29 | DRG: 689 | Disposition: A | Discharge status: Skilled Nursing Facility | Attending: Internal Medicine | Admitting: Internal Medicine

## 2024-05-23 ENCOUNTER — Encounter (HOSPITAL_COMMUNITY): Payer: Self-pay

## 2024-05-23 ENCOUNTER — Other Ambulatory Visit: Payer: Self-pay

## 2024-05-23 DIAGNOSIS — F0390 Unspecified dementia without behavioral disturbance: Secondary | ICD-10-CM

## 2024-05-23 DIAGNOSIS — I499 Cardiac arrhythmia, unspecified: Secondary | ICD-10-CM | POA: Diagnosis not present

## 2024-05-23 DIAGNOSIS — W1830XA Fall on same level, unspecified, initial encounter: Secondary | ICD-10-CM | POA: Diagnosis present

## 2024-05-23 DIAGNOSIS — S0990XA Unspecified injury of head, initial encounter: Secondary | ICD-10-CM | POA: Diagnosis present

## 2024-05-23 DIAGNOSIS — E782 Mixed hyperlipidemia: Secondary | ICD-10-CM | POA: Diagnosis present

## 2024-05-23 DIAGNOSIS — R410 Disorientation, unspecified: Secondary | ICD-10-CM

## 2024-05-23 DIAGNOSIS — Z1152 Encounter for screening for COVID-19: Secondary | ICD-10-CM

## 2024-05-23 DIAGNOSIS — E860 Dehydration: Secondary | ICD-10-CM | POA: Diagnosis present

## 2024-05-23 DIAGNOSIS — Z888 Allergy status to other drugs, medicaments and biological substances status: Secondary | ICD-10-CM

## 2024-05-23 DIAGNOSIS — F411 Generalized anxiety disorder: Secondary | ICD-10-CM | POA: Diagnosis present

## 2024-05-23 DIAGNOSIS — W19XXXA Unspecified fall, initial encounter: Secondary | ICD-10-CM | POA: Diagnosis not present

## 2024-05-23 DIAGNOSIS — F03B11 Unspecified dementia, moderate, with agitation: Secondary | ICD-10-CM | POA: Diagnosis not present

## 2024-05-23 DIAGNOSIS — F02B11 Dementia in other diseases classified elsewhere, moderate, with agitation: Secondary | ICD-10-CM | POA: Diagnosis present

## 2024-05-23 DIAGNOSIS — Z8673 Personal history of transient ischemic attack (TIA), and cerebral infarction without residual deficits: Secondary | ICD-10-CM

## 2024-05-23 DIAGNOSIS — F02B4 Dementia in other diseases classified elsewhere, moderate, with anxiety: Secondary | ICD-10-CM | POA: Diagnosis present

## 2024-05-23 DIAGNOSIS — G309 Alzheimer's disease, unspecified: Secondary | ICD-10-CM | POA: Diagnosis present

## 2024-05-23 DIAGNOSIS — M79604 Pain in right leg: Secondary | ICD-10-CM | POA: Diagnosis not present

## 2024-05-23 DIAGNOSIS — G9341 Metabolic encephalopathy: Secondary | ICD-10-CM | POA: Diagnosis present

## 2024-05-23 DIAGNOSIS — Z79899 Other long term (current) drug therapy: Secondary | ICD-10-CM

## 2024-05-23 DIAGNOSIS — I10 Essential (primary) hypertension: Secondary | ICD-10-CM | POA: Diagnosis not present

## 2024-05-23 DIAGNOSIS — I951 Orthostatic hypotension: Secondary | ICD-10-CM | POA: Diagnosis present

## 2024-05-23 DIAGNOSIS — Z79890 Hormone replacement therapy (postmenopausal): Secondary | ICD-10-CM

## 2024-05-23 DIAGNOSIS — M170 Bilateral primary osteoarthritis of knee: Secondary | ICD-10-CM | POA: Diagnosis present

## 2024-05-23 DIAGNOSIS — Z881 Allergy status to other antibiotic agents status: Secondary | ICD-10-CM

## 2024-05-23 DIAGNOSIS — Z885 Allergy status to narcotic agent status: Secondary | ICD-10-CM

## 2024-05-23 DIAGNOSIS — Z8616 Personal history of COVID-19: Secondary | ICD-10-CM

## 2024-05-23 DIAGNOSIS — Z03822 Encounter for observation for suspected aspirated (inhaled) foreign body ruled out: Secondary | ICD-10-CM | POA: Diagnosis not present

## 2024-05-23 DIAGNOSIS — G9340 Encephalopathy, unspecified: Secondary | ICD-10-CM | POA: Diagnosis not present

## 2024-05-23 DIAGNOSIS — Z66 Do not resuscitate: Secondary | ICD-10-CM | POA: Diagnosis present

## 2024-05-23 DIAGNOSIS — N390 Urinary tract infection, site not specified: Principal | ICD-10-CM | POA: Diagnosis present

## 2024-05-23 DIAGNOSIS — Z7982 Long term (current) use of aspirin: Secondary | ICD-10-CM

## 2024-05-23 DIAGNOSIS — N3091 Cystitis, unspecified with hematuria: Secondary | ICD-10-CM

## 2024-05-23 DIAGNOSIS — F0280 Dementia in other diseases classified elsewhere without behavioral disturbance: Secondary | ICD-10-CM | POA: Diagnosis present

## 2024-05-23 DIAGNOSIS — K7581 Nonalcoholic steatohepatitis (NASH): Secondary | ICD-10-CM | POA: Diagnosis present

## 2024-05-23 DIAGNOSIS — Z860100 Personal history of colon polyps, unspecified: Secondary | ICD-10-CM

## 2024-05-23 DIAGNOSIS — R296 Repeated falls: Secondary | ICD-10-CM | POA: Diagnosis present

## 2024-05-23 DIAGNOSIS — R4182 Altered mental status, unspecified: Secondary | ICD-10-CM | POA: Diagnosis not present

## 2024-05-23 DIAGNOSIS — R7303 Prediabetes: Secondary | ICD-10-CM | POA: Diagnosis present

## 2024-05-23 LAB — COMPREHENSIVE METABOLIC PANEL WITH GFR
ALT: 11 U/L (ref 0–44)
AST: 20 U/L (ref 15–41)
Albumin: 4.2 g/dL (ref 3.5–5.0)
Alkaline Phosphatase: 109 U/L (ref 38–126)
Anion gap: 11 (ref 5–15)
BUN: 16 mg/dL (ref 8–23)
CO2: 23 mmol/L (ref 22–32)
Calcium: 9.6 mg/dL (ref 8.9–10.3)
Chloride: 107 mmol/L (ref 98–111)
Creatinine, Ser: 0.82 mg/dL (ref 0.44–1.00)
GFR, Estimated: >60 mL/min (ref >60)
Glucose, Bld: 133 mg/dL — ABNORMAL HIGH (ref 70–99)
Potassium: 3.5 mmol/L (ref 3.5–5.1)
Sodium: 140 mmol/L (ref 135–145)
Total Bilirubin: 1 mg/dL (ref 0.0–1.2)
Total Protein: 7.2 g/dL (ref 6.5–8.1)

## 2024-05-23 LAB — CBC WITH DIFFERENTIAL/PLATELET
Abs Immature Granulocytes: 0.06 K/uL (ref 0.00–0.07)
Basophils Absolute: 0 K/uL (ref 0.0–0.1)
Basophils Relative: 0 %
Eosinophils Absolute: 0 K/uL (ref 0.0–0.5)
Eosinophils Relative: 0 %
HCT: 37.8 % (ref 36.0–46.0)
Hemoglobin: 12.7 g/dL (ref 12.0–15.0)
Immature Granulocytes: 0 %
Lymphocytes Relative: 9 %
Lymphs Abs: 1.3 K/uL (ref 0.7–4.0)
MCH: 33.2 pg (ref 26.0–34.0)
MCHC: 33.6 g/dL (ref 30.0–36.0)
MCV: 98.7 fL (ref 80.0–100.0)
Monocytes Absolute: 1 K/uL (ref 0.1–1.0)
Monocytes Relative: 7 %
Neutro Abs: 12.2 K/uL — ABNORMAL HIGH (ref 1.7–7.7)
Neutrophils Relative %: 84 %
Platelets: 191 K/uL (ref 150–400)
RBC: 3.83 MIL/uL — ABNORMAL LOW (ref 3.87–5.11)
RDW: 13 % (ref 11.5–15.5)
WBC: 14.6 K/uL — ABNORMAL HIGH (ref 4.0–10.5)
nRBC: 0 % (ref 0.0–0.2)

## 2024-05-23 LAB — AMMONIA: Ammonia: 19 umol/L (ref 9–35)

## 2024-05-23 LAB — MAGNESIUM: Magnesium: 2.3 mg/dL (ref 1.7–2.4)

## 2024-05-23 MED ORDER — ONDANSETRON HCL 4 MG/2ML IJ SOLN: 4.0000 mg | Freq: Four times a day (QID) | INTRAMUSCULAR | Status: DC | PRN

## 2024-05-23 MED ORDER — ONDANSETRON HCL 4 MG PO TABS: 4.0000 mg | ORAL_TABLET | Freq: Four times a day (QID) | ORAL | Status: DC | PRN

## 2024-05-23 MED ORDER — ACETAMINOPHEN 650 MG RE SUPP: 650.0000 mg | Freq: Four times a day (QID) | RECTAL | Status: DC | PRN

## 2024-05-23 MED ORDER — POLYETHYLENE GLYCOL 3350 17 G PO PACK: 17.0000 g | PACK | Freq: Every day | ORAL | Status: DC | PRN

## 2024-05-23 MED ORDER — ENOXAPARIN SODIUM 40 MG/0.4ML IJ SOSY
40.0000 mg | PREFILLED_SYRINGE | INTRAMUSCULAR | Status: DC
  Administered 2024-05-23 – 2024-05-28 (×6): 40 mg via SUBCUTANEOUS
  Filled 2024-05-23 (×6): qty 0.4

## 2024-05-23 MED ORDER — AMPICILLIN-SULBACTAM SODIUM 3 (2-1) G IJ SOLR: 3.0000 g | Freq: Once | INTRAMUSCULAR | Status: DC

## 2024-05-23 MED ORDER — SODIUM CHLORIDE 0.9 % IV BOLUS
500.0000 mL | Freq: Once | INTRAVENOUS | Status: AC
  Administered 2024-05-23: 500 mL via INTRAVENOUS

## 2024-05-23 MED ORDER — ACETAMINOPHEN 325 MG PO TABS: 650.0000 mg | ORAL_TABLET | Freq: Four times a day (QID) | ORAL | Status: DC | PRN

## 2024-05-23 MED ORDER — DEXTROSE IN LACTATED RINGERS 5 % IV SOLN: INTRAVENOUS | Status: AC

## 2024-05-23 MED ORDER — LACTATED RINGERS IV BOLUS
500.0000 mL | Freq: Once | INTRAVENOUS | Status: AC
  Administered 2024-05-23: 500 mL via INTRAVENOUS

## 2024-05-23 NOTE — H&P (Signed)
 History and Physical    CHE BELOW FMW:[id number] DOB: [birth year] DOA: 05/23/2024  PCP: Kip Righter, MD   Patient coming from: Home  I have personally briefly reviewed patient's old medical records in [facility]  Chief Complaint: AMS  HPI: Diana Smith is a 78 y.o. female with medical history significant for dementia, hypertension, stroke, NASH, depression, RBBB. Patient was brought to the ED with reports of worsening altered mental status.   Patient was in the ED yesterday for a fall 2 nights ago, the morning, patient was confused, with worsening agitation.  In the ED, imaging included chest x-ray, head and cervical CT-which were negative for acute abnormality.  UA, CBC, CMP, lipase, ammonia, troponin, COVID influenza RSV, TSH were all unremarkable/CT.  Spouse reports that patient was given Ativan , home prior to discharge she was given a dose of Xanax , and husband gave patient's 2 more doses of Xanax  on arrival home.  This morning he was unable to wake patient up. Reports at baseline, patient has dementia, but she is able to answer simple questions. My evaluation, patient is somnolent, but arouses to voice, moans, opens her eyes, moves all extremities, verbalize yes to a question if she knows where she is.  Per spouse, patient has barely eaten or drunk anything in the past 2 days.  ED Course: Afebrile.  Temperature 97.6.  Heart rate 52-89.  Respirate rate 14-19.  Blood pressure systolic 119-150.  O2 sats 91 to 95% on room air. Pelvic x-ray negative for acute abnormality. Chest x-ray without active disease. IV Unasyn started in ED for possible pneumonia.  500 mL bolus given.  Review of Systems: Unable to assess from patient due to altered mental status.  Past Medical History:  Diagnosis Date   Abdominal pain 04/24/2022   Actinic keratosis    Allergic rhinitis due to pollen    Cerebrovascular disease    Dementia due to Alzheimer's disease 09/10/2022   Essential  hypertension 04/24/2022   Frequent urinary tract infections    Gastro-esophageal reflux disease without esophagitis    Generalized anxiety disorder    Heartburn    Hematochezia    History of COVID-19    Hyperglycemia    Hyperlipidemia    Hypoalbuminemia due to protein-calorie malnutrition 04/24/2022   Insomnia 04/24/2022   Major depressive disorder    Nonalcoholic steatohepatitis (NASH)    Osteoarthritis of left knee 03/21/2022   Osteoarthritis of right knee 01/25/2021   Overactive bladder    Personal history of colonic polyps    Prediabetes    Pulmonary nodule 04/24/2022   Pure hypercholesterolemia    Right bundle branch block    Rosacea    Sciatica, right side    Slow transit constipation    Starvation ketoacidosis 04/24/2022   UTI (urinary tract infection) 04/24/2022   Vitamin B12 deficiency (non anemic)    Vitamin D  deficiency     Past Surgical History:  Procedure Laterality Date   HYSTEROTOMY     MASS EXCISION  10/08/2011   Procedure: MINOR EXCISION OF MASS;  Surgeon: Lamar LULLA Leonor Mickey., MD;  Location: [facility];  Service: Orthopedics;  Laterality: Left;  excisional biopsy dorsum left long finger MCP joint     reports that she has never smoked. She has never used smokeless tobacco. She reports that she does not drink alcohol and does not use drugs.  Allergies  Allergen Reactions   Ativan  [Lorazepam ] Other (See Comments)    Made her out of it,  made her memory worse for an extended time period after taking   Gatifloxacin Other (See Comments)    Unknown    Hydrocodone Other (See Comments)    Unknown    Keflex  [Cephalexin ] Other (See Comments)    Unknown    Lorabid [Loracarbef] Other (See Comments)    Unknown    Minocycline Other (See Comments)    Unknown     Family History  Problem Relation Age of Onset   Dementia Father    Memory loss Father    Breast cancer Neg Hx     Prior to Admission medications   Medication Sig Start Date End  Date Taking? Authorizing Provider  ALPRAZolam  (XANAX ) 0.25 MG tablet Take 1 tablet (0.25 mg total) by mouth every 8 (eight) hours as needed for anxiety. 09/24/23  Yes Ricky Fines, MD  aspirin  EC 81 MG tablet Take 1 tablet (81 mg total) by mouth daily. Swallow whole. 12/28/22  Yes Pearlean Manus, MD  calcium  carbonate (OSCAL) 1500 (600 Ca) MG TABS tablet Take 600 mg of elemental calcium  by mouth in the morning and at bedtime.   Yes [provider]  escitalopram  (LEXAPRO ) 10 MG tablet Take 10 mg by mouth daily.   Yes [provider]  estradiol (ESTRACE) 0.1 MG/GM vaginal cream Place 1 Applicatorful vaginally daily. Every day for 14 days, then twice a week. 11/09/23  Yes [provider]  Melatonin 12 MG TABS Take 12 mg by mouth at bedtime.   Yes [provider]  midodrine  (PROAMATINE ) 10 MG tablet Take 10 mg by mouth 3 (three) times daily.   Yes [provider]  nystatin  cream (MYCOSTATIN ) Apply 1 Application topically 2 (two) times daily as needed (yeast infection). Apply to stomach and private parts 11/20/23  Yes [provider]  QUEtiapine  (SEROQUEL ) 25 MG tablet Take 1 tablet (25 mg total) by mouth 2 (two) times daily. 09/24/23  Yes Ricky Fines, MD  rosuvastatin  (CRESTOR ) 20 MG tablet Take 1 tablet (20 mg total) by mouth daily. 12/27/22 05/23/24 Yes Camillo Quadros, Courage, MD  memantine  (NAMENDA ) 10 MG tablet Take 10 mg by mouth at bedtime. 08/06/22   [provider]    Physical Exam:  exam due to altered mental status Vitals:   05/23/24 1430 05/23/24 1730 05/23/24 1834 05/23/24 1843  BP: 119/88 (!) 139/104  (!) 150/72  Pulse: 84 89  (!) 52  Resp: 16 16  19   Temp:  97.8 F (36.6 C)    TempSrc:  Oral    SpO2: 95% 94%  95%  Weight:   73.9 kg   Height:        Constitutional: NAD, calm, comfortable Vitals:   05/23/24 1430 05/23/24 1730 05/23/24 1834 05/23/24 1843  BP: 119/88 (!) 139/104  (!) 150/72  Pulse: 84 89  (!) 52  Resp: 16 16   19   Temp:  97.8 F (36.6 C)    TempSrc:  Oral    SpO2: 95% 94%  95%  Weight:   73.9 kg   Height:       Eyes: PERRL, lids and conjunctivae normal ENMT: Mucous membranes are dry  Neck: normal, supple, no masses, no thyromegaly Respiratory: clear to auscultation bilaterally, no wheezing, no crackles. Normal respiratory effort. No accessory muscle use.  Cardiovascular: Regular rate and rhythm, no murmurs / rubs / gallops. No extremity edema.  Abdomen: Equivocal abdominal exam,, no masses palpated. No hepatosplenomegaly.  Urine draining via pure wick appears quite murky and dark. Musculoskeletal: no  clubbing / cyanosis. No joint deformity upper and lower extremities.  Skin: no rashes, lesions, ulcers. No induration Neurologic: Limited exam, patient's somnolent, no facial asymmetry, moves extremity spontaneously Psychiatric: Limited exam due to altered mental status, patient somnolent.  Labs on Admission: I have personally reviewed following labs and imaging studies  CBC: Recent Labs  Lab 05/22/24 0946 05/23/24 1518  WBC 10.4 14.6*  NEUTROABS 7.6 12.2*  HGB 13.5 12.7  HCT 39.2 37.8  MCV 95.8 98.7  PLT 255 191   Basic Metabolic Panel: Recent Labs  Lab 05/22/24 0946 05/23/24 1518  NA 143 140  K 4.2 3.5  CL 109 107  CO2 21* 23  GLUCOSE 101* 133*  BUN 12 16  CREATININE 0.93 0.82  CALCIUM  10.0 9.6   GFR: Estimated Creatinine Clearance: 56.9 mL/min (by C-G formula based on SCr of 0.82 mg/dL). Liver Function Tests: Recent Labs  Lab 05/22/24 0946 05/23/24 1518  AST 26 20  ALT 14 11  ALKPHOS 116 109  BILITOT 0.7 1.0  PROT 7.3 7.2  ALBUMIN 4.5 4.2   Recent Labs  Lab 05/22/24 0946  LIPASE 23   Recent Labs  Lab 05/22/24 0946 05/23/24 1518  AMMONIA 18 19   Thyroid  Function Tests: Recent Labs    05/22/24 1030  TSH 4.420  FREET4 1.30*   Radiological Exams on Admission: DG Chest Port 1 View Result Date: 05/23/2024 CLINICAL DATA:  Concern for aspiration.  EXAM: PORTABLE CHEST 1 VIEW COMPARISON:  Chest radiograph dated 05/22/2024. FINDINGS: No focal consolidation, pleural effusion or pneumothorax. The cardiac silhouette. No acute osseous pathology. IMPRESSION: No active disease. Electronically Signed   By: Vanetta Chou M.D.   On: 05/23/2024 17:19   DG Hip Unilat W or Wo Pelvis 2-3 Views Right Result Date: 05/23/2024 EXAM: 2 OR MORE VIEW(S) XRAY OF THE RIGHT HIP 05/23/2024 02:18:00 PM COMPARISON: None available. CLINICAL HISTORY: Right lower extremity pain and dementia. FINDINGS: BONES AND JOINTS: The patient is rotated to the left on today's radiographs. No external rotation view is provided. We have adducted and mildly abducted views of the right hip. Presumably standard positioning was problematic due to compliance issues. No fracture or acute bony findings identified. Minimal spurring of the right femoral head. The hip joint is maintained. SOFT TISSUES: The soft tissues are unremarkable. IMPRESSION: 1. No acute bony findings in the right hip. 2. Minimal degenerative spurring of the right femoral head. Electronically signed by: Ryan Salvage MD 05/23/2024 03:46 PM EST RP Workstation: [id number]   CT Head Wo Contrast Result Date: 05/22/2024 EXAM: CT HEAD WITHOUT 05/22/2024 10:49:54 AM TECHNIQUE: CT of the head was performed without the administration of intravenous contrast. Automated exposure control, iterative reconstruction, and/or weight based adjustment of the mA/kV was utilized to reduce the radiation dose to as low as reasonably achievable. COMPARISON: 03/10/2024 CLINICAL HISTORY: Head trauma, minor (Age >= 65y) FINDINGS: BRAIN AND VENTRICLES: No acute intracranial hemorrhage. No mass effect or midline shift. No extra-axial fluid collection. No evidence of acute infarct. No hydrocephalus. Patchy periventricular and subcortical white matter hypoattenuation, likely representing chronic small vessel ischemic change. Mild ventricular and sulcal  prominence secondary to age-related volume loss. Calcified atherosclerotic plaque within the cavernous/supraclinoid internal carotid arteries. ORBITS: No acute abnormality. SINUSES AND MASTOIDS: No acute abnormality. SOFT TISSUES AND SKULL: No acute skull fracture. No acute soft tissue abnormality. IMPRESSION: 1. No acute intracranial abnormality related to the head trauma. 2. Patchy periventricular and subcortical white matter hypoattenuation, likely representing chronic small vessel ischemic change.  3. Mild ventricular and sulcal prominence secondary to age-related volume loss. 4. Calcified atherosclerotic plaque within the cavernous/supraclinoid internal carotid arteries. Electronically signed by: Evalene Coho MD 05/22/2024 11:03 AM EST RP Workstation: [id number]   CT Cervical Spine Wo Contrast Result Date: 05/22/2024 CLINICAL DATA:  Neck trauma. Increased confusion and altered mental status. Multiple falls. EXAM: CT CERVICAL SPINE WITHOUT CONTRAST TECHNIQUE: Multidetector CT imaging of the cervical spine was performed without intravenous contrast. Multiplanar CT image reconstructions were also generated. RADIATION DOSE REDUCTION: This exam was performed according to the departmental dose-optimization program which includes automated exposure control, adjustment of the mA and/or kV according to patient size and/or use of iterative reconstruction technique. COMPARISON:  None Available. FINDINGS: Technical note: Despite efforts by the technologist and patient, moderate to severe motion artifact is present on today's exam and could not be eliminated. This reduces exam sensitivity and specificity. Alignment: Straightening without focal angulation or significant listhesis. Skull base and vertebrae: No displaced cervical spine fracture or traumatic subluxation identified. Subtle osseous injuries could be obscured by the motion artifact. Soft tissues and spinal canal: No prevertebral fluid or swelling. No  visible canal hematoma. Disc levels: Multilevel spondylosis with disc space narrowing, uncinate spurring and facet hypertrophy. Details are partially obscured by motion artifact. Upper chest: Clear lung apices. Other: Asymmetric degenerative changes at the left TMJ. IMPRESSION: 1. Moderate to severe motion artifact despite efforts by the technologist and patient. 2. No displaced cervical spine fracture or traumatic subluxation identified. Subtle osseous injuries could be obscured by the motion artifact. If the patient has persistent neck pain or neurological symptoms, follow-up imaging recommended. 3. Multilevel cervical spondylosis. Electronically Signed   By: Elsie Perone M.D.   On: 05/22/2024 10:56   DG Chest Portable 1 View Result Date: 05/22/2024 EXAM: 1 VIEW(S) XRAY OF THE CHEST 05/22/2024 09:54:00 AM COMPARISON: 03/15/2024 CLINICAL HISTORY: fall FINDINGS: LUNGS AND PLEURA: No focal pulmonary opacity. No pleural effusion. No pneumothorax. HEART AND MEDIASTINUM: Aortic atherosclerosis. No acute abnormality of the cardiac and mediastinal silhouettes. BONES AND SOFT TISSUES: No acute osseous abnormality. IMPRESSION: 1. No acute process. 2. Aortic atherosclerosis. Electronically signed by: Evalene Coho MD 05/22/2024 10:14 AM EST RP Workstation: [id number]   EKG: Independently reviewed.  Sinus rhythm, rate 70, QTc prolonged 517.  Old RBBB.  LAFB.  No significant change from prior.  Assessment/Plan Principal Problem:   Acute metabolic encephalopathy Active Problems:   Generalized anxiety disorder   Nonalcoholic steatohepatitis (NASH)   Essential hypertension   Dementia due to Alzheimer's disease   History of ischemic stroke   Assessment and Plan: No notes have been filed under this hospital service. Service: Hospitalist  Acute metabolic encephalopathy-fall yesterday with confusion, this morning patient quite somnolent.  Was given Ativan  in the ED yesterday for agitation and subsequently  3 doses of PTA max.  11/25-Work up included head and cervical CT-markable, also unremarkable UA, CMP, CBC TSH and ammonia.  Today woke up with similar, UA draining via pure wick appears questionable.  Chest x-ray is clear.  WBC elevated from 10.4 yesterday to 14.6 today.  Also barely any oral intake in the past 2 days.   - Etiology possible dehydration, benzo induced somnolence, and UTI pending UA. - IV Unasyn given in ED for possible aspiration.  - Check UA and urine cultures - 1 L bolus, continue D5 LR at 75 cc/h - Hold further sedating medications - N.p.o. till improvement in mental status  Dementia, anxiety-lives at home.  At baseline able to  answer questions. - Hold Lexapro , Namenda , Seroquel  for now with altered mental status.  Hypertension-stable.  Not on antihypertensives.  Midodrine  10 mg 3 times daily on med list.  NASH steatohepatitis  History of stroke -Hold aspirin , Crestor  for now while n.p.o.   DVT prophylaxis: Lovenox  Code Status: DNR Family Communication: Spouse at bedside Disposition Plan: ~ 2 days Consults called: none Admission status: inpt tele I certify that at the point of admission it is my clinical judgment that the patient will require inpatient hospital care spanning beyond 2 midnights from the point of admission due to high intensity of service, high risk for further deterioration and high frequency of surveillance required.   Author: Tully FORBES Carwin, MD 05/23/2024 8:21 PM  For on call review [email or web address].

## 2024-05-23 NOTE — ED Provider Notes (Signed)
 Patient is a 78 year old female presenting with worsening mental status, had full workup yesterday, vital signs are unremarkable here, x-ray with possible left lower lobe infiltrate though it is difficult to tell, she has developed a little bit of a white blood cell count, she is afebrile she is not tachycardic she is not terribly hypoxic.  Antibiotics given, discussed with hospitalist Dr. Pearlean who will admit   Cleotilde Rogue, MD 05/23/24 [phone]

## 2024-05-23 NOTE — ED Provider Notes (Signed)
 [place] EMERGENCY DEPARTMENT AT [facility] Provider Note   CSN: [id number] Arrival date & time: 05/23/24  1209     Patient presents with: Altered Mental Status   Diana Smith is a 78 y.o. female.   78 year old female history of Diana Smith, generalized anxiety, and Alzheimer's dementia who presented to the emergency department confusion.  Patient presented yesterday after a fall.  She had an extensive workup including a CT head, C-spine, urinalysis, ammonia, electrolytes, chest x-ray, and COVID and flu that were all negative.  Was given Ativan  as part of her workup and husband reports that she was more drowsy after that.  Received 5 mg of Xanax  last night after going home and husband reports that she has not been waking up since.  Called her primary doctor who told her to come to the emergency department       Prior to Admission medications   Medication Sig Start Date End Date Taking? Authorizing Provider  ALPRAZolam  (XANAX ) 0.25 MG tablet Take 1 tablet (0.25 mg total) by mouth every 8 (eight) hours as needed for anxiety. 09/24/23  Yes Ricky Fines, MD  aspirin  EC 81 MG tablet Take 1 tablet (81 mg total) by mouth daily. Swallow whole. 12/28/22  Yes Pearlean Manus, MD  acetaminophen  (TYLENOL ) 500 MG tablet Take 500-1,000 mg by mouth every 6 (six) hours as needed for moderate pain (pain score 4-6).    [provider]  calcium  carbonate (OSCAL) 1500 (600 Ca) MG TABS tablet Take 600 mg of elemental calcium  by mouth in the morning and at bedtime.    [provider]  clobetasol (TEMOVATE) 0.05 % external solution Apply 1 Application topically 2 (two) times daily. 04/05/24   [provider]  escitalopram  (LEXAPRO ) 10 MG tablet Take 10 mg by mouth daily.    [provider]  estradiol (ESTRACE) 0.1 MG/GM vaginal cream Place 1 Applicatorful vaginally daily. Every day for 14 days, then twice a week. 11/09/23   [provider]  famotidine   (PEPCID ) 20 MG tablet Take 20 mg by mouth at bedtime. 11/19/23   [provider]  ketoconazole (NIZORAL) 2 % shampoo Apply 1 Application topically 2 (two) times a week. 03/08/24   [provider]  Melatonin 12 MG TABS Take 12 mg by mouth at bedtime.    [provider]  memantine  (NAMENDA ) 10 MG tablet Take 10 mg by mouth at bedtime. 08/06/22   [provider]  midodrine  (PROAMATINE ) 10 MG tablet Take 10 mg by mouth 3 (three) times daily.    [provider]  nitrofurantoin , macrocrystal-monohydrate, (MACROBID ) 100 MG capsule Take 100 mg by mouth at bedtime. 03/08/24   [provider]  nystatin  cream (MYCOSTATIN ) Apply 1 Application topically 2 (two) times daily as needed for dry skin. 11/20/23   [provider]  potassium chloride  SA (KLOR-CON  M) 20 MEQ tablet Take 20 mEq by mouth daily. Patient not taking: Reported on 03/15/2024 11/10/23   [provider]  QUEtiapine  (SEROQUEL ) 25 MG tablet Take 1 tablet (25 mg total) by mouth 2 (two) times daily. 09/24/23   Ricky Fines, MD  rosuvastatin  (CRESTOR ) 20 MG tablet Take 1 tablet (20 mg total) by mouth daily. 12/27/22 03/15/24  Pearlean Manus, MD    Allergies: Ativan  [lorazepam ], Gatifloxacin, Hydrocodone, Keflex  [cephalexin ], Lorabid [loracarbef], and Minocycline    Review of Systems  Updated Vital Signs BP 119/88   Pulse 84   Temp 97.6 F (36.4 C) (Axillary)   Resp 16  Ht 5' 5 (1.651 m)   Wt 77.1 kg   SpO2 95%   BMI 28.29 kg/m   Physical Exam Vitals and nursing note reviewed.  Constitutional:      Appearance: She is well-developed.     Comments: Somnolent but not in acute distress  HENT:     Head: Normocephalic and atraumatic.     Right Ear: External ear normal.     Left Ear: External ear normal.     Nose: Nose normal.  Eyes:     Extraocular Movements: Extraocular movements intact.     Conjunctiva/sclera: Conjunctivae normal.     Pupils: Pupils are equal, round,  and reactive to light.  Neck:     Comments: No C-spine midline tenderness to palpation Cardiovascular:     Rate and Rhythm: Normal rate and regular rhythm.     Heart sounds: No murmur heard. Pulmonary:     Effort: Pulmonary effort is normal. No respiratory distress.     Breath sounds: Normal breath sounds.  Abdominal:     General: Abdomen is flat. There is no distension.     Palpations: Abdomen is soft. There is no mass.     Tenderness: There is no abdominal tenderness. There is no guarding.  Musculoskeletal:     Cervical back: Normal range of motion and neck supple.     Right lower leg: No edema.     Left lower leg: No edema.     Comments: No obvious deformities.  Appears to wince when palpating her right hip  Skin:    General: Skin is warm and dry.  Neurological:     Comments: Withdraws all 4 extremities.  Will open eyes to voice.  Occasionally will mumble nonsensical words.  Psychiatric:        Mood and Affect: Mood normal.     (all labs ordered are listed, but only abnormal results are displayed) Labs Reviewed  CBC WITH DIFFERENTIAL/PLATELET - Abnormal; Notable for the following components:      Result Value   WBC 14.6 (*)    RBC 3.83 (*)    Neutro Abs 12.2 (*)    All other components within normal limits  COMPREHENSIVE METABOLIC PANEL WITH GFR - Abnormal; Notable for the following components:   Glucose, Bld 133 (*)    All other components within normal limits  CULTURE, BLOOD (ROUTINE X 2)  CULTURE, BLOOD (ROUTINE X 2)  URINE CULTURE  AMMONIA    EKG: None  Radiology: DG Hip Unilat W or Wo Pelvis 2-3 Views Right Result Date: 05/23/2024 EXAM: 2 OR MORE VIEW(S) XRAY OF THE RIGHT HIP 05/23/2024 02:18:00 PM COMPARISON: None available. CLINICAL HISTORY: Right lower extremity pain and dementia. FINDINGS: BONES AND JOINTS: The patient is rotated to the left on today's radiographs. No external rotation view is provided. We have adducted and mildly abducted views of the  right hip. Presumably standard positioning was problematic due to compliance issues. No fracture or acute bony findings identified. Minimal spurring of the right femoral head. The hip joint is maintained. SOFT TISSUES: The soft tissues are unremarkable. IMPRESSION: 1. No acute bony findings in the right hip. 2. Minimal degenerative spurring of the right femoral head. Electronically signed by: Ryan Salvage MD 05/23/2024 03:46 PM EST RP Workstation: [id number]   CT Head Wo Contrast Result Date: 05/22/2024 EXAM: CT HEAD WITHOUT 05/22/2024 10:49:54 AM TECHNIQUE: CT of the head was performed without the administration of intravenous contrast. Automated exposure control, iterative reconstruction, and/or weight based adjustment  of the mA/kV was utilized to reduce the radiation dose to as low as reasonably achievable. COMPARISON: 03/10/2024 CLINICAL HISTORY: Head trauma, minor (Age >= 65y) FINDINGS: BRAIN AND VENTRICLES: No acute intracranial hemorrhage. No mass effect or midline shift. No extra-axial fluid collection. No evidence of acute infarct. No hydrocephalus. Patchy periventricular and subcortical white matter hypoattenuation, likely representing chronic small vessel ischemic change. Mild ventricular and sulcal prominence secondary to age-related volume loss. Calcified atherosclerotic plaque within the cavernous/supraclinoid internal carotid arteries. ORBITS: No acute abnormality. SINUSES AND MASTOIDS: No acute abnormality. SOFT TISSUES AND SKULL: No acute skull fracture. No acute soft tissue abnormality. IMPRESSION: 1. No acute intracranial abnormality related to the head trauma. 2. Patchy periventricular and subcortical white matter hypoattenuation, likely representing chronic small vessel ischemic change. 3. Mild ventricular and sulcal prominence secondary to age-related volume loss. 4. Calcified atherosclerotic plaque within the cavernous/supraclinoid internal carotid arteries. Electronically signed by:  Evalene Coho MD 05/22/2024 11:03 AM EST RP Workstation: [id number]   CT Cervical Spine Wo Contrast Result Date: 05/22/2024 CLINICAL DATA:  Neck trauma. Increased confusion and altered mental status. Multiple falls. EXAM: CT CERVICAL SPINE WITHOUT CONTRAST TECHNIQUE: Multidetector CT imaging of the cervical spine was performed without intravenous contrast. Multiplanar CT image reconstructions were also generated. RADIATION DOSE REDUCTION: This exam was performed according to the departmental dose-optimization program which includes automated exposure control, adjustment of the mA and/or kV according to patient size and/or use of iterative reconstruction technique. COMPARISON:  None Available. FINDINGS: Technical note: Despite efforts by the technologist and patient, moderate to severe motion artifact is present on today's exam and could not be eliminated. This reduces exam sensitivity and specificity. Alignment: Straightening without focal angulation or significant listhesis. Skull base and vertebrae: No displaced cervical spine fracture or traumatic subluxation identified. Subtle osseous injuries could be obscured by the motion artifact. Soft tissues and spinal canal: No prevertebral fluid or swelling. No visible canal hematoma. Disc levels: Multilevel spondylosis with disc space narrowing, uncinate spurring and facet hypertrophy. Details are partially obscured by motion artifact. Upper chest: Clear lung apices. Other: Asymmetric degenerative changes at the left TMJ. IMPRESSION: 1. Moderate to severe motion artifact despite efforts by the technologist and patient. 2. No displaced cervical spine fracture or traumatic subluxation identified. Subtle osseous injuries could be obscured by the motion artifact. If the patient has persistent neck pain or neurological symptoms, follow-up imaging recommended. 3. Multilevel cervical spondylosis. Electronically Signed   By: Elsie Perone M.D.   On: 05/22/2024 10:56    DG Chest Portable 1 View Result Date: 05/22/2024 EXAM: 1 VIEW(S) XRAY OF THE CHEST 05/22/2024 09:54:00 AM COMPARISON: 03/15/2024 CLINICAL HISTORY: fall FINDINGS: LUNGS AND PLEURA: No focal pulmonary opacity. No pleural effusion. No pneumothorax. HEART AND MEDIASTINUM: Aortic atherosclerosis. No acute abnormality of the cardiac and mediastinal silhouettes. BONES AND SOFT TISSUES: No acute osseous abnormality. IMPRESSION: 1. No acute process. 2. Aortic atherosclerosis. Electronically signed by: Evalene Coho MD 05/22/2024 10:14 AM EST RP Workstation: [id number]     Procedures   Medications Ordered in the ED  Ampicillin-Sulbactam (UNASYN) 3 g in sodium chloride  0.9 % 100 mL IVPB (has no administration in time range)  sodium chloride  0.9 % bolus 500 mL (500 mLs Intravenous New Bag/Given 05/23/24 1530)    Clinical Course as of 05/23/24 1722  Wed May 23, 2024  1553 Signed out to Dr Cleotilde [RP]    Clinical Course User Index [RP] Yolande Lamar BROCKS, MD  Medical Decision Making Amount and/or Complexity of Data Reviewed Labs: ordered. Radiology: ordered.  Risk Decision regarding hospitalization.   WALDINE ZENZ is a 78 year old female history of Diana Smith, generalized anxiety, and Alzheimer's dementia who presents emergency department confusion  Initial Ddx:  Medication side effect, delirium, hip fracture, ICH, TBI, UTI, hyperammonemia  MDM/Course:  Patient presents emergency department with altered mental status.  Presented yesterday after a fall.  She received Ativan  as part of her workup to facilitate imaging.  Husband reports that she has been drowsy since then.  Was also given Xanax  (5 mg) last night as well.  Has not received any additional medication today but has been persistently altered.  On exam she is very drowsy.  Will open her eyes to voice mumble nonsensical words and withdraws all 4 extremities.  Yesterday had an extensive workup which  included a head CT, C-spine CT, chest x-ray, urinalysis, ammonia, and blood work which was reassuring.  Did obtain a hip x-ray because she is having some tenderness to palpation there which was WNL.  Repeat blood work unremarkable.  Upon re-evaluation still very drowsy after several hours.  Given the fact that she has been like this since yesterday and not received any additional medications today concerned that she is now delirious potentially from her benzodiazepine use.  With her husband being her primary caretaker at home may potentially need placement to the memory care facility if she does not go back to her baseline.  Will admit to hospitalist for further management  This patient presents to the ED for concern of complaints listed in HPI, this involves an extensive number of treatment options, and is a complaint that carries with it a high risk of complications and morbidity. Disposition including potential need for admission considered.   Dispo: Admit to Floor  Additional history obtained from spouse Records reviewed Outpatient Clinic Notes The following labs were independently interpreted: Chemistry and show no acute abnormality I independently reviewed the following imaging with scope of interpretation limited to determining acute life threatening conditions related to emergency care: Extremity x-ray(s) and agree with the radiologist interpretation with the following exceptions: none I personally reviewed and interpreted cardiac monitoring: normal sinus rhythm  I personally reviewed and interpreted the pt's EKG: see above for interpretation  I have reviewed the patients home medications and made adjustments as needed Consults: Hospitalist Social Determinants of health:  Geriatric  Portions of this note were generated with Scientist, clinical (histocompatibility and immunogenetics). Dictation errors may occur despite best attempts at proofreading.     Final diagnoses:  Altered mental status, unspecified altered mental  status type  Acute encephalopathy  Dementia, unspecified dementia severity, unspecified dementia type, unspecified whether behavioral, psychotic, or mood disturbance or anxiety [facility])  Confusion    ED Discharge Orders     None          Yolande Lamar BROCKS, MD 05/23/24 1722

## 2024-05-23 NOTE — ED Triage Notes (Addendum)
 Pt arrived via [facility] from home after Pts husband called stating Pt has worsening AMS. Pt seen here yesterday for the same. Pt does present with advancing Alzheimer's dementia and is incontinent. Pts unable to follow commands or provide any medical history.

## 2024-05-23 NOTE — ED Notes (Signed)
 Hospitalist at bedside

## 2024-05-24 ENCOUNTER — Inpatient Hospital Stay (HOSPITAL_COMMUNITY)

## 2024-05-24 DIAGNOSIS — R296 Repeated falls: Secondary | ICD-10-CM | POA: Diagnosis not present

## 2024-05-24 DIAGNOSIS — Z885 Allergy status to narcotic agent status: Secondary | ICD-10-CM | POA: Diagnosis not present

## 2024-05-24 DIAGNOSIS — G9341 Metabolic encephalopathy: Secondary | ICD-10-CM

## 2024-05-24 DIAGNOSIS — R109 Unspecified abdominal pain: Secondary | ICD-10-CM | POA: Diagnosis not present

## 2024-05-24 DIAGNOSIS — G309 Alzheimer's disease, unspecified: Secondary | ICD-10-CM | POA: Diagnosis not present

## 2024-05-24 DIAGNOSIS — K7581 Nonalcoholic steatohepatitis (NASH): Secondary | ICD-10-CM

## 2024-05-24 DIAGNOSIS — S0990XA Unspecified injury of head, initial encounter: Secondary | ICD-10-CM | POA: Diagnosis not present

## 2024-05-24 DIAGNOSIS — Z8673 Personal history of transient ischemic attack (TIA), and cerebral infarction without residual deficits: Secondary | ICD-10-CM | POA: Diagnosis not present

## 2024-05-24 DIAGNOSIS — Z888 Allergy status to other drugs, medicaments and biological substances status: Secondary | ICD-10-CM | POA: Diagnosis not present

## 2024-05-24 DIAGNOSIS — R4182 Altered mental status, unspecified: Secondary | ICD-10-CM | POA: Diagnosis not present

## 2024-05-24 DIAGNOSIS — R410 Disorientation, unspecified: Secondary | ICD-10-CM | POA: Diagnosis not present

## 2024-05-24 DIAGNOSIS — Z03822 Encounter for observation for suspected aspirated (inhaled) foreign body ruled out: Secondary | ICD-10-CM | POA: Diagnosis not present

## 2024-05-24 DIAGNOSIS — Z881 Allergy status to other antibiotic agents status: Secondary | ICD-10-CM | POA: Diagnosis not present

## 2024-05-24 DIAGNOSIS — I951 Orthostatic hypotension: Secondary | ICD-10-CM | POA: Diagnosis not present

## 2024-05-24 DIAGNOSIS — F02B11 Dementia in other diseases classified elsewhere, moderate, with agitation: Secondary | ICD-10-CM | POA: Diagnosis not present

## 2024-05-24 DIAGNOSIS — E860 Dehydration: Secondary | ICD-10-CM | POA: Diagnosis not present

## 2024-05-24 DIAGNOSIS — Z7982 Long term (current) use of aspirin: Secondary | ICD-10-CM | POA: Diagnosis not present

## 2024-05-24 DIAGNOSIS — Z8616 Personal history of COVID-19: Secondary | ICD-10-CM | POA: Diagnosis not present

## 2024-05-24 DIAGNOSIS — I10 Essential (primary) hypertension: Secondary | ICD-10-CM | POA: Diagnosis not present

## 2024-05-24 DIAGNOSIS — N390 Urinary tract infection, site not specified: Secondary | ICD-10-CM | POA: Diagnosis not present

## 2024-05-24 DIAGNOSIS — F0390 Unspecified dementia without behavioral disturbance: Secondary | ICD-10-CM

## 2024-05-24 DIAGNOSIS — Z66 Do not resuscitate: Secondary | ICD-10-CM | POA: Diagnosis not present

## 2024-05-24 DIAGNOSIS — F02B4 Dementia in other diseases classified elsewhere, moderate, with anxiety: Secondary | ICD-10-CM | POA: Diagnosis not present

## 2024-05-24 DIAGNOSIS — F411 Generalized anxiety disorder: Secondary | ICD-10-CM | POA: Diagnosis not present

## 2024-05-24 DIAGNOSIS — Z90710 Acquired absence of both cervix and uterus: Secondary | ICD-10-CM | POA: Diagnosis not present

## 2024-05-24 DIAGNOSIS — Z1152 Encounter for screening for COVID-19: Secondary | ICD-10-CM | POA: Diagnosis not present

## 2024-05-24 DIAGNOSIS — Z860100 Personal history of colon polyps, unspecified: Secondary | ICD-10-CM | POA: Diagnosis not present

## 2024-05-24 DIAGNOSIS — Z79899 Other long term (current) drug therapy: Secondary | ICD-10-CM | POA: Diagnosis not present

## 2024-05-24 DIAGNOSIS — E782 Mixed hyperlipidemia: Secondary | ICD-10-CM | POA: Diagnosis not present

## 2024-05-24 DIAGNOSIS — G9340 Encephalopathy, unspecified: Secondary | ICD-10-CM | POA: Diagnosis not present

## 2024-05-24 DIAGNOSIS — W1830XA Fall on same level, unspecified, initial encounter: Secondary | ICD-10-CM | POA: Diagnosis not present

## 2024-05-24 DIAGNOSIS — N3091 Cystitis, unspecified with hematuria: Secondary | ICD-10-CM | POA: Diagnosis not present

## 2024-05-24 LAB — CBC
HCT: 33.3 % — ABNORMAL LOW (ref 36.0–46.0)
Hemoglobin: 11.1 g/dL — ABNORMAL LOW (ref 12.0–15.0)
MCH: 32.7 pg (ref 26.0–34.0)
MCHC: 33.3 g/dL (ref 30.0–36.0)
MCV: 98.2 fL (ref 80.0–100.0)
Platelets: 177 K/uL (ref 150–400)
RBC: 3.39 MIL/uL — ABNORMAL LOW (ref 3.87–5.11)
RDW: 12.9 % (ref 11.5–15.5)
WBC: 13.7 K/uL — ABNORMAL HIGH (ref 4.0–10.5)
nRBC: 0 % (ref 0.0–0.2)

## 2024-05-24 LAB — URINALYSIS, ROUTINE W REFLEX MICROSCOPIC
Bilirubin Urine: NEGATIVE
Glucose, UA: NEGATIVE mg/dL
Ketones, ur: 5 mg/dL — AB
Nitrite: NEGATIVE
Protein, ur: 100 mg/dL — AB
Specific Gravity, Urine: 1.029 (ref 1.005–1.030)
pH: 5 (ref 5.0–8.0)

## 2024-05-24 LAB — BASIC METABOLIC PANEL WITH GFR
Anion gap: 11 (ref 5–15)
BUN: 15 mg/dL (ref 8–23)
CO2: 22 mmol/L (ref 22–32)
Calcium: 9 mg/dL (ref 8.9–10.3)
Chloride: 110 mmol/L (ref 98–111)
Creatinine, Ser: 0.68 mg/dL (ref 0.44–1.00)
GFR, Estimated: >60 mL/min (ref >60)
Glucose, Bld: 119 mg/dL — ABNORMAL HIGH (ref 70–99)
Potassium: 3.3 mmol/L — ABNORMAL LOW (ref 3.5–5.1)
Sodium: 142 mmol/L (ref 135–145)

## 2024-05-24 MED ORDER — BISACODYL 10 MG RE SUPP
10.0000 mg | Freq: Once | RECTAL | Status: AC
  Administered 2024-05-24: 10 mg via RECTAL
  Filled 2024-05-24: qty 1

## 2024-05-24 MED ORDER — POLYETHYLENE GLYCOL 3350 17 G PO PACK
17.0000 g | PACK | Freq: Every day | ORAL | Status: DC
  Administered 2024-05-24: 17 g via ORAL
  Filled 2024-05-24 (×3): qty 1

## 2024-05-24 MED ORDER — ROSUVASTATIN CALCIUM 20 MG PO TABS
20.0000 mg | ORAL_TABLET | Freq: Every day | ORAL | Status: DC
  Administered 2024-05-24 – 2024-05-29 (×6): 20 mg via ORAL
  Filled 2024-05-24 (×2): qty 1
  Filled 2024-05-24: qty 2
  Filled 2024-05-24 (×3): qty 1

## 2024-05-24 MED ORDER — POTASSIUM CHLORIDE CRYS ER 10 MEQ PO TBCR
10.0000 meq | EXTENDED_RELEASE_TABLET | Freq: Once | ORAL | Status: AC
  Administered 2024-05-24: 10 meq via ORAL
  Filled 2024-05-24: qty 1

## 2024-05-24 MED ORDER — POTASSIUM CHLORIDE IN NACL 40-0.9 MEQ/L-% IV SOLN: INTRAVENOUS | Status: AC

## 2024-05-24 MED ORDER — IOHEXOL 9 MG/ML PO SOLN
500.0000 mL | ORAL | Status: AC
  Administered 2024-05-24 (×2): 500 mL via ORAL

## 2024-05-24 MED ORDER — MELATONIN 3 MG PO TABS
3.0000 mg | ORAL_TABLET | Freq: Every evening | ORAL | Status: DC | PRN
  Administered 2024-05-24 – 2024-05-28 (×5): 3 mg via ORAL
  Filled 2024-05-24 (×5): qty 1

## 2024-05-24 MED ORDER — IOHEXOL 300 MG/ML  SOLN
100.0000 mL | Freq: Once | INTRAMUSCULAR | Status: AC | PRN
  Administered 2024-05-24: 100 mL via INTRAVENOUS

## 2024-05-24 MED ORDER — ASPIRIN 81 MG PO TBEC
81.0000 mg | DELAYED_RELEASE_TABLET | Freq: Every day | ORAL | Status: DC
  Administered 2024-05-24 – 2024-05-29 (×6): 81 mg via ORAL
  Filled 2024-05-24 (×6): qty 1

## 2024-05-24 MED ORDER — ESCITALOPRAM OXALATE 10 MG PO TABS
10.0000 mg | ORAL_TABLET | Freq: Every day | ORAL | Status: DC
  Administered 2024-05-24 – 2024-05-29 (×6): 10 mg via ORAL
  Filled 2024-05-24 (×6): qty 1

## 2024-05-24 MED ORDER — SODIUM CHLORIDE 0.9 % IV SOLN
1.0000 g | Freq: Two times a day (BID) | INTRAVENOUS | Status: AC
  Administered 2024-05-24 – 2024-05-29 (×11): 1 g via INTRAVENOUS
  Filled 2024-05-24 (×11): qty 20

## 2024-05-24 MED ORDER — NYSTATIN 100000 UNIT/GM EX CREA: 1.0000 | TOPICAL_CREAM | Freq: Two times a day (BID) | CUTANEOUS | Status: DC | PRN

## 2024-05-24 MED ORDER — MEMANTINE HCL 10 MG PO TABS
10.0000 mg | ORAL_TABLET | Freq: Every day | ORAL | Status: DC
  Administered 2024-05-24 – 2024-05-28 (×5): 10 mg via ORAL
  Filled 2024-05-24 (×5): qty 1

## 2024-05-24 NOTE — Care Management Obs Status (Signed)
 MEDICARE OBSERVATION STATUS NOTIFICATION   Patient Details  Name: Diana Smith MRN: [id number] Date of Birth: [birth year]   Medicare Observation Status Notification Given:  Yes    Sharlyne Stabs, RN 05/24/2024, 10:55 AM

## 2024-05-24 NOTE — TOC Progression Note (Addendum)
  Transition of Care [facility]) - Inpatient Brief Assessment   Patient Details  Name: Diana Smith MRN: [id number] Date of Birth: [birth year]  Transition of Care [facility] CM/SW Contact:    Sharlyne Stabs, RN Phone Number: 05/24/2024, 12:15 PM   Clinical Narrative: Patient admitted in OBS with acute metabolic encephalopathy. Non clinical sitter at the bedside. CM spoke with her husband, She has been at [facility] in the past, he took her back home. Currently they do not have any help. His children plan to hire private sitter to assist him with her care. PT eval pending. IPCM following.    Transition of Care Asessment: Insurance and Status: Insurance coverage has been reviewed Patient has primary care physician: Yes Home environment has been reviewed: Home Prior level of function:: Needs assistance Prior/Current Home Services: No current home services Social Drivers of Health Review: SDOH reviewed no interventions necessary Readmission risk has been reviewed: Yes Transition of care needs: no transition of care needs at this time         Social Drivers of Health (SDOH) Interventions SDOH Screenings   Food Insecurity: No Food Insecurity (05/23/2024)  Housing: Low Risk  (05/23/2024)  Transportation Needs: No Transportation Needs (05/23/2024)  Utilities: Not At Risk (05/23/2024)  Social Connections: Socially Integrated (05/23/2024)  Tobacco Use: Low Risk  (05/23/2024)

## 2024-05-24 NOTE — Plan of Care (Signed)
   Problem: Activity: Goal: Risk for activity intolerance will decrease Outcome: Progressing   Problem: Coping: Goal: Level of anxiety will decrease Outcome: Progressing   Problem: Pain Managment: Goal: General experience of comfort will improve and/or be controlled Outcome: Progressing   Problem: Skin Integrity: Goal: Risk for impaired skin integrity will decrease Outcome: Progressing

## 2024-05-24 NOTE — Plan of Care (Signed)
   Problem: Education: Goal: Knowledge of General Education information will improve Description Including pain rating scale, medication(s)/side effects and non-pharmacologic comfort measures Outcome: Progressing

## 2024-05-24 NOTE — Hospital Course (Addendum)
 78 y.o. female with medical history significant for dementia, hypertension, hyperlipidemia stroke, NASH, depression, RBBB presented with altered mental status.  The patient was in the ED on 05/22/2024 because of falls and confusion.  The patient had an unwitnessed fall in the middle of the night 11/24-11/25.  Spouse had difficulty getting her up off the floor.  EMS was activated and the patient was brought to the emergency department.  Labs including CMP, CBC were unremarkable with a TSH 4.420.  Ammonia was 18.  UA was negative for pyuria.  CT of the brain was negative for acute findings.  CT cervical spine was negative for fracture or dislocation or subluxation.  The patient was discharged home in stable condition after being given Ativan  IV for agitation.. After pt returned home, husband gave patient's 2 more doses of Xanax   0.25 mg. On the morning of 05/23/2024, spouse had difficulty waking the patient up due to somnolence.  At baseline, the patient has cognitive impairment..  She is able to answer simple questions and speak fluently.  She is pleasantly confused.  She is able to ambulate without assistive devices.  She was able to clothe herself.  Patient has had decreased oral intake for the past 2 days.  Spouse states that the patient has not had any recent complaints.  Spouse states that the patient's cognitive impairment has been fairly stable for the last 6 months.  There is not been any new medications or changes in her medications.  No reports of fevers, chills, chest pain, shortness breath, cough, hemoptysis, vomiting, diarrhea, abdominal pain.  In the ED, the patient had low-grade temperature 100.1 F.  She was hemodynamically stable with oxygen saturation 95% room air.  WBC 14.6, hemoglobin 12.7, platelets 191.  Sodium 140, potassium 3.5, bicarbonate 23, serum creatinine 0.82.  UA 21-50WBC She was started on IV fluids. She was started in IV meropenem  empirically.  She improved clinically and  mental status returned to baseline.

## 2024-05-24 NOTE — Progress Notes (Addendum)
 Pt currently receiving continuous maintenance fluids. Purewick in place, no urine output has occurred during night shift. Bladder scanned pt. Pt currently retaining 130 mls of urine. Pt was able to pass urine, collected urine sample per MD orders. Plan of care ongoing.

## 2024-05-24 NOTE — Progress Notes (Signed)
 Pts son inquired about sleeping meds that pt receives at home. NP Jesus contacted, new order placed for 3 mg of Melatonin. Crushed and given with applesauce. Pt tolerated well. Pt care ongoing.

## 2024-05-24 NOTE — Progress Notes (Signed)
 PROGRESS NOTE  Diana Smith FMW:[id number] DOB: 1946-01-13 DOA: 05/23/2024 PCP: Kip Righter, MD  Brief History:  78 y.o. female with medical history significant for dementia, hypertension, hyperlipidemia stroke, NASH, depression, RBBB presented with altered mental status.  The patient was in the ED on 05/22/2024 because of falls and confusion.  The patient had an unwitnessed fall in the middle of the night 11/24-11/25.  Spouse had difficulty getting her up off the floor.  EMS was activated and the patient was brought to the emergency department.  Labs including CMP, CBC were unremarkable with a TSH 4.420.  Ammonia was 18.  UA was negative for pyuria.  CT of the brain was negative for acute findings.  CT cervical spine was negative for fracture or dislocation or subluxation.  The patient was discharged home in stable condition after being given Ativan  IV for agitation.. After pt returned home, husband gave patient's 2 more doses of Xanax   0.25 mg. On the morning of 05/23/2024, spouse had difficulty waking the patient up due to somnolence.  At baseline, the patient has cognitive impairment..  She is able to answer simple questions and speak fluently.  She is pleasantly confused.  She is able to ambulate without assistive devices.  She was able to clothe herself.  Patient has had decreased oral intake for the past 2 days.  Spouse states that the patient has not had any recent complaints.  Spouse states that the patient's cognitive impairment has been fairly stable for the last 6 months.  There is not been any new medications or changes in her medications.  No reports of fevers, chills, chest pain, shortness breath, cough, hemoptysis, vomiting, diarrhea, abdominal pain.  In the ED, the patient had low-grade temperature 100.1 F.  She was hemodynamically stable with oxygen saturation 95% room air.  WBC 14.6, hemoglobin 12.7, platelets 191.  Sodium 140, potassium 3.5, bicarbonate 23, serum  creatinine 0.82.  UA 21-50WBC She was started on IV fluids.   Assessment/Plan: Acute metabolic encephalopathy -Ammonia 19 -B12 - Folic acid  - TSH 4.420 - 05/22/2024 CT brain negative for acute findings - 05/22/2024 CT cervical spine negative for acute fracture or subluxation - 05/24/24--remains confused and agitated  UTI - UA 21-50 WBC -start ceftriaxone  - follow urine culture  Dehydration - Continue IV fluids  Mixed hyperlipidemia - Continue statin  Major neurocognitive disorder - Continue Namenda  - Continue Lexapro  - Holding Seroquel  temporarily  Essential hypertension - Previously took valsartan  - Monitor off BP meds as patient has had orthostatic hypotension  Abdominal pain -CT abd/pelvis         Family Communication:   husband at bedside 11/27  Consultants:  none  Code Status:  DNR  DVT Prophylaxis:   [place] Lovenox    Procedures: As Listed in Progress Note Above  Antibiotics: Ceftriaxone  11/27>>     Subjective: Patient is confused.  Review of systems is not reliable.  No respiratory distress, vomiting, uncontrolled pain.  Objective: Vitals:   05/23/24 2229 05/23/24 2300 05/24/24 0306 05/24/24 0636  BP: (!) 151/71  (!) 147/82 (!) 116/100  Pulse: 90  77 91  Resp: 16  18   Temp: 99.5 F (37.5 C) 100.1 F (37.8 C) 98.9 F (37.2 C) 99 F (37.2 C)  TempSrc: Axillary Axillary Oral Oral  SpO2: 94%  95% 93%  Weight:      Height:        Intake/Output Summary (Last 24 hours) at 05/24/2024  9063 Last data filed at 05/24/2024 0411 Gross per 24 hour  Intake --  Output 130 ml  Net -130 ml   Weight change:  Exam:  General:  Pt is alert, follows commands appropriately, not in acute distress HEENT: No icterus, No thrush, No neck mass, [place]/AT Cardiovascular: RRR, S1/S2, no rubs, no gallops Respiratory: CTA bilaterally, no wheezing, no crackles, no rhonchi Abdomen: Soft/+BS, non tender, non distended, no guarding Extremities: No edema, No  lymphangitis, No petechiae, No rashes, no synovitis   Data Reviewed: I have personally reviewed following labs and imaging studies Basic Metabolic Panel: Recent Labs  Lab 05/22/24 0946 05/23/24 1518 05/24/24 0426  NA 143 140 142  K 4.2 3.5 3.3*  CL 109 107 110  CO2 21* 23 22  GLUCOSE 101* 133* 119*  BUN 12 16 15   CREATININE 0.93 0.82 0.68  CALCIUM  10.0 9.6 9.0  MG  --  2.3  --    Liver Function Tests: Recent Labs  Lab 05/22/24 0946 05/23/24 1518  AST 26 20  ALT 14 11  ALKPHOS 116 109  BILITOT 0.7 1.0  PROT 7.3 7.2  ALBUMIN 4.5 4.2   Recent Labs  Lab 05/22/24 0946  LIPASE 23   Recent Labs  Lab 05/22/24 0946 05/23/24 1518  AMMONIA 18 19   Coagulation Profile: No results for input(s): INR, PROTIME in the last 168 hours. CBC: Recent Labs  Lab 05/22/24 0946 05/23/24 1518 05/24/24 0426  WBC 10.4 14.6* 13.7*  NEUTROABS 7.6 12.2*  --   HGB 13.5 12.7 11.1*  HCT 39.2 37.8 33.3*  MCV 95.8 98.7 98.2  PLT 255 191 177   Cardiac Enzymes: No results for input(s): CKTOTAL, CKMB, CKMBINDEX, TROPONINI in the last 168 hours. BNP: Invalid input(s): POCBNP CBG: No results for input(s): GLUCAP in the last 168 hours. HbA1C: No results for input(s): HGBA1C in the last 72 hours. Urine analysis:    Component Value Date/Time   COLORURINE YELLOW 05/24/2024 0504   APPEARANCEUR TURBID (A) 05/24/2024 0504   LABSPEC 1.029 05/24/2024 0504   PHURINE 5.0 05/24/2024 0504   GLUCOSEU NEGATIVE 05/24/2024 0504   HGBUR MODERATE (A) 05/24/2024 0504   BILIRUBINUR NEGATIVE 05/24/2024 0504   BILIRUBINUR small (A) 04/23/2024 1706   KETONESUR 5 (A) 05/24/2024 0504   PROTEINUR 100 (A) 05/24/2024 0504   UROBILINOGEN 0.2 04/23/2024 1706   NITRITE NEGATIVE 05/24/2024 0504   LEUKOCYTESUR MODERATE (A) 05/24/2024 0504   Sepsis Labs: @LABRCNTIP (procalcitonin:4,lacticidven:4) ) Recent Results (from the past 240 hours)  Resp panel by RT-PCR (RSV, Flu A&B, Covid)  Anterior Nasal Swab     Status: None   Collection Time: 05/22/24  9:46 AM   Specimen: Anterior Nasal Swab  Result Value Ref Range Status   SARS Coronavirus 2 by RT PCR NEGATIVE NEGATIVE Final    Comment: (NOTE) SARS-CoV-2 target nucleic acids are NOT DETECTED.  The SARS-CoV-2 RNA is generally detectable in upper respiratory specimens during the acute phase of infection. The lowest concentration of SARS-CoV-2 viral copies this assay can detect is 138 copies/mL. A negative result does not preclude SARS-Cov-2 infection and should not be used as the sole basis for treatment or other patient management decisions. A negative result may occur with  improper specimen collection/handling, submission of specimen other than nasopharyngeal swab, presence of viral mutation(s) within the areas targeted by this assay, and inadequate number of viral copies(<138 copies/mL). A negative result must be combined with clinical observations, patient history, and epidemiological information. The expected result is Negative.  Fact Sheet for Patients:  bloggercourse.com  Fact Sheet for Healthcare Providers:  seriousbroker.it  This test is no t yet approved or cleared by the [place]  FDA and  has been authorized for detection and/or diagnosis of SARS-CoV-2 by FDA under an Emergency Use Authorization (EUA). This EUA will remain  in effect (meaning this test can be used) for the duration of the COVID-19 declaration under Section 564(b)(1) of the Act, 21 U.S.C.section 360bbb-3(b)(1), unless the authorization is terminated  or revoked sooner.       Influenza A by PCR NEGATIVE NEGATIVE Final   Influenza B by PCR NEGATIVE NEGATIVE Final    Comment: (NOTE) The Xpert Xpress SARS-CoV-2/FLU/RSV plus assay is intended as an aid in the diagnosis of influenza from Nasopharyngeal swab specimens and should not be used as a sole basis for treatment. Nasal washings  and aspirates are unacceptable for Xpert Xpress SARS-CoV-2/FLU/RSV testing.  Fact Sheet for Patients: bloggercourse.com  Fact Sheet for Healthcare Providers: seriousbroker.it  This test is not yet approved or cleared by the [place]  FDA and has been authorized for detection and/or diagnosis of SARS-CoV-2 by FDA under an Emergency Use Authorization (EUA). This EUA will remain in effect (meaning this test can be used) for the duration of the COVID-19 declaration under Section 564(b)(1) of the Act, 21 U.S.C. section 360bbb-3(b)(1), unless the authorization is terminated or revoked.     Resp Syncytial Virus by PCR NEGATIVE NEGATIVE Final    Comment: (NOTE) Fact Sheet for Patients: bloggercourse.com  Fact Sheet for Healthcare Providers: seriousbroker.it  This test is not yet approved or cleared by the [place]  FDA and has been authorized for detection and/or diagnosis of SARS-CoV-2 by FDA under an Emergency Use Authorization (EUA). This EUA will remain in effect (meaning this test can be used) for the duration of the COVID-19 declaration under Section 564(b)(1) of the Act, 21 U.S.C. section 360bbb-3(b)(1), unless the authorization is terminated or revoked.  Performed at [facility], [address] [place], [place] [address]   Blood culture (routine x 2)     Status: None (Preliminary result)   Collection Time: 05/23/24  5:44 PM   Specimen: BLOOD  Result Value Ref Range Status   Specimen Description BLOOD BLOOD RIGHT HAND  Final   Special Requests   Final    BOTTLES DRAWN AEROBIC ONLY Blood Culture results may not be optimal due to an inadequate volume of blood received in culture bottles   Culture   Final    NO GROWTH < 12 HOURS Performed at [facility], [address], [place], [place] [address]    Report Status PENDING  Incomplete  Blood culture (routine x 2)      Status: None (Preliminary result)   Collection Time: 05/23/24  5:51 PM   Specimen: BLOOD  Result Value Ref Range Status   Specimen Description BLOOD RIGHT ANTECUBITAL  Final   Special Requests   Final    BOTTLES DRAWN AEROBIC AND ANAEROBIC Blood Culture adequate volume   Culture   Final    NO GROWTH < 12 HOURS Performed at [facility], [address] [place], [place] [address]    Report Status PENDING  Incomplete     Scheduled Meds:  aspirin  EC  81 mg Oral Daily   enoxaparin  (LOVENOX ) injection  40 mg Subcutaneous Q24H   escitalopram   10 mg Oral Daily   memantine   10 mg Oral QHS   rosuvastatin   20 mg Oral Daily   Continuous Infusions:  ampicillin-sulbactam (UNASYN) IV     dextrose  5% lactated ringers  75 mL/hr at 05/23/24 2049    Procedures/Studies: DG Chest Port 1 View Result Date: 05/23/2024 CLINICAL DATA:  Concern for aspiration. EXAM: PORTABLE CHEST 1 VIEW COMPARISON:  Chest radiograph dated 05/22/2024. FINDINGS: No focal consolidation, pleural effusion or pneumothorax. The cardiac silhouette. No acute osseous pathology. IMPRESSION: No active disease. Electronically Signed   By: Vanetta Chou M.D.   On: 05/23/2024 17:19   DG Hip Unilat W or Wo Pelvis 2-3 Views Right Result Date: 05/23/2024 EXAM: 2 OR MORE VIEW(S) XRAY OF THE RIGHT HIP 05/23/2024 02:18:00 PM COMPARISON: None available. CLINICAL HISTORY: Right lower extremity pain and dementia. FINDINGS: BONES AND JOINTS: The patient is rotated to the left on today's radiographs. No external rotation view is provided. We have adducted and mildly abducted views of the right hip. Presumably standard positioning was problematic due to compliance issues. No fracture or acute bony findings identified. Minimal spurring of the right femoral head. The hip joint is maintained. SOFT TISSUES: The soft tissues are unremarkable. IMPRESSION: 1. No acute bony findings in the right hip. 2. Minimal degenerative spurring of the right femoral  head. Electronically signed by: Ryan Salvage MD 05/23/2024 03:46 PM EST RP Workstation: [id number]   CT Head Wo Contrast Result Date: 05/22/2024 EXAM: CT HEAD WITHOUT 05/22/2024 10:49:54 AM TECHNIQUE: CT of the head was performed without the administration of intravenous contrast. Automated exposure control, iterative reconstruction, and/or weight based adjustment of the mA/kV was utilized to reduce the radiation dose to as low as reasonably achievable. COMPARISON: 03/10/2024 CLINICAL HISTORY: Head trauma, minor (Age >= 65y) FINDINGS: BRAIN AND VENTRICLES: No acute intracranial hemorrhage. No mass effect or midline shift. No extra-axial fluid collection. No evidence of acute infarct. No hydrocephalus. Patchy periventricular and subcortical white matter hypoattenuation, likely representing chronic small vessel ischemic change. Mild ventricular and sulcal prominence secondary to age-related volume loss. Calcified atherosclerotic plaque within the cavernous/supraclinoid internal carotid arteries. ORBITS: No acute abnormality. SINUSES AND MASTOIDS: No acute abnormality. SOFT TISSUES AND SKULL: No acute skull fracture. No acute soft tissue abnormality. IMPRESSION: 1. No acute intracranial abnormality related to the head trauma. 2. Patchy periventricular and subcortical white matter hypoattenuation, likely representing chronic small vessel ischemic change. 3. Mild ventricular and sulcal prominence secondary to age-related volume loss. 4. Calcified atherosclerotic plaque within the cavernous/supraclinoid internal carotid arteries. Electronically signed by: Evalene Coho MD 05/22/2024 11:03 AM EST RP Workstation: [id number]   CT Cervical Spine Wo Contrast Result Date: 05/22/2024 CLINICAL DATA:  Neck trauma. Increased confusion and altered mental status. Multiple falls. EXAM: CT CERVICAL SPINE WITHOUT CONTRAST TECHNIQUE: Multidetector CT imaging of the cervical spine was performed without intravenous  contrast. Multiplanar CT image reconstructions were also generated. RADIATION DOSE REDUCTION: This exam was performed according to the departmental dose-optimization program which includes automated exposure control, adjustment of the mA and/or kV according to patient size and/or use of iterative reconstruction technique. COMPARISON:  None Available. FINDINGS: Technical note: Despite efforts by the technologist and patient, moderate to severe motion artifact is present on today's exam and could not be eliminated. This reduces exam sensitivity and specificity. Alignment: Straightening without focal angulation or significant listhesis. Skull base and vertebrae: No displaced cervical spine fracture or traumatic subluxation identified. Subtle osseous injuries could be obscured by the motion artifact. Soft tissues and spinal canal: No prevertebral fluid or swelling. No visible canal hematoma. Disc levels: Multilevel spondylosis with disc space narrowing, uncinate spurring and facet hypertrophy. Details are  partially obscured by motion artifact. Upper chest: Clear lung apices. Other: Asymmetric degenerative changes at the left TMJ. IMPRESSION: 1. Moderate to severe motion artifact despite efforts by the technologist and patient. 2. No displaced cervical spine fracture or traumatic subluxation identified. Subtle osseous injuries could be obscured by the motion artifact. If the patient has persistent neck pain or neurological symptoms, follow-up imaging recommended. 3. Multilevel cervical spondylosis. Electronically Signed   By: Elsie Perone M.D.   On: 05/22/2024 10:56   DG Chest Portable 1 View Result Date: 05/22/2024 EXAM: 1 VIEW(S) XRAY OF THE CHEST 05/22/2024 09:54:00 AM COMPARISON: 03/15/2024 CLINICAL HISTORY: fall FINDINGS: LUNGS AND PLEURA: No focal pulmonary opacity. No pleural effusion. No pneumothorax. HEART AND MEDIASTINUM: Aortic atherosclerosis. No acute abnormality of the cardiac and mediastinal  silhouettes. BONES AND SOFT TISSUES: No acute osseous abnormality. IMPRESSION: 1. No acute process. 2. Aortic atherosclerosis. Electronically signed by: Evalene Coho MD 05/22/2024 10:14 AM EST RP Workstation: [email or web address] Alm Schneider, DO  [facility]  If 7PM-7AM, please contact night-coverage [email or web address] Password [email or web address] 05/24/2024, 9:36 AM   LOS: 0 days

## 2024-05-25 DIAGNOSIS — G9341 Metabolic encephalopathy: Secondary | ICD-10-CM | POA: Diagnosis not present

## 2024-05-25 DIAGNOSIS — F0390 Unspecified dementia without behavioral disturbance: Secondary | ICD-10-CM | POA: Diagnosis not present

## 2024-05-25 DIAGNOSIS — N3091 Cystitis, unspecified with hematuria: Secondary | ICD-10-CM | POA: Diagnosis not present

## 2024-05-25 DIAGNOSIS — K7581 Nonalcoholic steatohepatitis (NASH): Secondary | ICD-10-CM | POA: Diagnosis not present

## 2024-05-25 LAB — CBC
HCT: 31.2 % — ABNORMAL LOW (ref 36.0–46.0)
Hemoglobin: 10.7 g/dL — ABNORMAL LOW (ref 12.0–15.0)
MCH: 33.2 pg (ref 26.0–34.0)
MCHC: 34.3 g/dL (ref 30.0–36.0)
MCV: 96.9 fL (ref 80.0–100.0)
Platelets: 184 K/uL (ref 150–400)
RBC: 3.22 MIL/uL — ABNORMAL LOW (ref 3.87–5.11)
RDW: 12.9 % (ref 11.5–15.5)
WBC: 12.8 K/uL — ABNORMAL HIGH (ref 4.0–10.5)
nRBC: 0 % (ref 0.0–0.2)

## 2024-05-25 LAB — BASIC METABOLIC PANEL WITH GFR
Anion gap: 11 (ref 5–15)
BUN: 13 mg/dL (ref 8–23)
CO2: 20 mmol/L — ABNORMAL LOW (ref 22–32)
Calcium: 8.8 mg/dL — ABNORMAL LOW (ref 8.9–10.3)
Chloride: 110 mmol/L (ref 98–111)
Creatinine, Ser: 0.65 mg/dL (ref 0.44–1.00)
GFR, Estimated: >60 mL/min (ref >60)
Glucose, Bld: 114 mg/dL — ABNORMAL HIGH (ref 70–99)
Potassium: 3.9 mmol/L (ref 3.5–5.1)
Sodium: 141 mmol/L (ref 135–145)

## 2024-05-25 LAB — URINE CULTURE

## 2024-05-25 LAB — FOLATE: Folate: 10.2 ng/mL (ref >5.9)

## 2024-05-25 LAB — MAGNESIUM: Magnesium: 2.2 mg/dL (ref 1.7–2.4)

## 2024-05-25 LAB — VITAMIN B12: Vitamin B-12: 832 pg/mL (ref 180–914)

## 2024-05-25 NOTE — Plan of Care (Signed)

## 2024-05-25 NOTE — Evaluation (Signed)
 Physical Therapy Evaluation Patient Details Name: Diana Smith MRN: [id number] DOB: [birth year] Today's Date: 05/25/2024  History of Present Illness  78 y.o. female with medical history significant for dementia, hypertension, hyperlipidemia stroke, NASH, depression, RBBB presented with altered mental status.  The patient was in the ED on 05/22/2024 because of falls and confusion.  The patient had an unwitnessed fall in the middle of the night 11/24-11/25.  Spouse had difficulty getting her up off the floor.  EMS was activated and the patient was brought to the emergency department.  Labs including CMP, CBC were unremarkable with a TSH 4.420.  Ammonia was 18.  UA was negative for pyuria.  CT of the brain was negative for acute findings.  CT cervical spine was negative for fracture or dislocation or subluxation.  The patient was discharged home in stable condition after being given Ativan  IV for agitation..  After pt returned home, husband gave patient's 2 more doses of Xanax   0.25 mg. On the morning of 05/23/2024, spouse had difficulty waking the patient up due to somnolence.  At baseline, the patient has cognitive impairment..  She is able to answer simple questions and speak fluently.  She is pleasantly confused.  She is able to ambulate without assistive devices.  She was able to clothe herself.  Patient has had decreased oral intake for the past 2 days.   Clinical Impression  Patient demonstrates slow labored movement for sitting up at bedside, once seated had severe posterior leaning that improved after 2-3 minutes of verbal/tactile cueing. Patient had difficulty using RW due to confusion, weakness and unable to transfer to chair with RW, required hand held assist and leaning on armrest of chair to complete transfer. Patient tolerated sitting up in chair after therapy with spouse and sitter in room. Patient will benefit from continued skilled physical therapy in hospital and recommended venue below  to increase strength, balance, endurance for safe ADLs and gait.           If plan is discharge home, recommend the following: A lot of help with bathing/dressing/bathroom;A lot of help with walking and/or transfers;Help with stairs or ramp for entrance;Assist for transportation;Assistance with cooking/housework   Can travel by private vehicle   No    Equipment Recommendations None recommended by PT  Recommendations for Other Services       Functional Status Assessment Patient has had a recent decline in their functional status and demonstrates the ability to make significant improvements in function in a reasonable and predictable amount of time.     Precautions / Restrictions Precautions Precautions: Fall Recall of Precautions/Restrictions: Impaired Restrictions Weight Bearing Restrictions Per Provider Order: No      Mobility  Bed Mobility Overal bed mobility: Needs Assistance Bed Mobility: Supine to Sit     Supine to sit: Mod assist     General bed mobility comments: slow labored movement, required repeated verbal/tactile cueing    Transfers Overall transfer level: Needs assistance Equipment used: Rolling walker (2 wheels), 1 person hand held assist Transfers: Sit to/from Stand, Bed to chair/wheelchair/BSC Sit to Stand: Mod assist, Max assist, +2 safety/equipment   Step pivot transfers: Mod assist, Max assist       General transfer comment: Poor carryover for using RW, required hand held assist and constant verbal/tactile cueing to transfer to chair    Ambulation/Gait Ambulation/Gait assistance: Mod assist, Max assist Gait Distance (Feet): 2 Feet Assistive device: 1 person hand held assist Gait Pattern/deviations: Decreased step length - left,  Decreased stance time - right, Decreased stride length Gait velocity: slow     General Gait Details: limited to a couple of steps at bedside with hand held assist and leaning on armrest of char  Stairs             Wheelchair Mobility     Tilt Bed    Modified Rankin (Stroke Patients Only)       Balance Overall balance assessment: Needs assistance Sitting-balance support: Feet supported, No upper extremity supported Sitting balance-Leahy Scale: Fair Sitting balance - Comments: fair/poor seated at EOB   Standing balance support: During functional activity, Bilateral upper extremity supported Standing balance-Leahy Scale: Poor Standing balance comment: using RW, hand held assist                             Pertinent Vitals/Pain Pain Assessment Pain Assessment: Faces Faces Pain Scale: Hurts even more Pain Location: general with movement Pain Descriptors / Indicators: Discomfort, Grimacing, Guarding Pain Intervention(s): Limited activity within patient's tolerance, Monitored during session, Repositioned    Home Living Family/patient expects to be discharged to:: Private residence Living Arrangements: Spouse/significant other Available Help at Discharge: Family;Available 24 hours/day Type of Home: House Home Access: Level entry       Home Layout: One level Home Equipment: Agricultural Consultant (2 wheels)      Prior Function Prior Level of Function : Needs assist       Physical Assist : Mobility (physical);ADLs (physical) Mobility (physical): Bed mobility;Transfers;Gait;Stairs   Mobility Comments: household ambulation without AD ADLs Comments: Able to complete BADL's independently     Extremity/Trunk Assessment   Upper Extremity Assessment Upper Extremity Assessment: Defer to OT evaluation    Lower Extremity Assessment Lower Extremity Assessment: Generalized weakness    Cervical / Trunk Assessment Cervical / Trunk Assessment: Normal  Communication   Communication Communication: Impaired Factors Affecting Communication: Other (comment)    Cognition Arousal: Alert Behavior During Therapy: Flat affect   PT - Cognitive impairments: History of  cognitive impairments                         Following commands: Impaired Following commands impaired: Follows one step commands inconsistently     Cueing Cueing Techniques: Verbal cues, Gestural cues, Tactile cues, Visual cues     General Comments      Exercises     Assessment/Plan    PT Assessment Patient needs continued PT services  PT Problem List Decreased strength;Decreased activity tolerance;Decreased balance;Decreased mobility       PT Treatment Interventions DME instruction;Gait training;Stair training;Functional mobility training;Therapeutic activities;Therapeutic exercise;Balance training;Patient/family education    PT Goals (Current goals can be found in the Care Plan section)  Acute Rehab PT Goals Patient Stated Goal: return home with family to assist PT Goal Formulation: With patient/family Time For Goal Achievement: 06/08/24 Potential to Achieve Goals: Good    Frequency Min 3X/week     Co-evaluation PT/OT/SLP Co-Evaluation/Treatment: Yes Reason for Co-Treatment: To address functional/ADL transfers PT goals addressed during session: Mobility/safety with mobility;Balance;Proper use of DME         AM-PAC PT 6 Clicks Mobility  Outcome Measure Help needed turning from your back to your side while in a flat bed without using bedrails?: A Lot Help needed moving from lying on your back to sitting on the side of a flat bed without using bedrails?: A Lot Help needed moving to and from a  bed to a chair (including a wheelchair)?: A Lot Help needed standing up from a chair using your arms (e.g., wheelchair or bedside chair)?: A Lot Help needed to walk in hospital room?: A Lot Help needed climbing 3-5 steps with a railing? : Total 6 Click Score: 11    End of Session Equipment Utilized During Treatment: Gait belt Activity Tolerance: Patient tolerated treatment well;Patient limited by fatigue Patient left: in chair;with call bell/phone within  reach;with nursing/sitter in room;with family/visitor present;Other (comment) (lift sling in chair to use for putting patient back to bed) Nurse Communication: Mobility status PT Visit Diagnosis: Unsteadiness on feet (R26.81);Other abnormalities of gait and mobility (R26.89);Muscle weakness (generalized) (M62.81)    Time: 8981-8951 PT Time Calculation (min) (ACUTE ONLY): 30 min   Charges:   PT Evaluation $PT Eval Moderate Complexity: 1 Mod PT Treatments $Therapeutic Activity: 23-37 mins PT General Charges $$ ACUTE PT VISIT: 1 Visit         4:12 PM, 05/25/24 Lynwood Music, MPT Physical Therapist with [facility] 336 [phone] office [phone] mobile phone

## 2024-05-25 NOTE — TOC PASRR Note (Signed)
  TOC Dementia Note   Patient Details  Name: MACIL CRADY Date of Birth: 07-Dec-1945 05/25/2024, 11:52 AM   To Whom It May Concern:  Please be advised that the above-named patient has a primary diagnosis of dementia which supersedes any psychiatric diagnosis.   Transition of Care (TOC) CM/SW Contact: Hoy DELENA Bigness, LCSW Phone Number: 05/25/2024, 11:52 AM

## 2024-05-25 NOTE — Progress Notes (Signed)
 PROGRESS NOTE  Diana Smith FMW:[id number] DOB: 10-18-1945 DOA: 05/23/2024 PCP: Kip Righter, MD  Brief History:  78 y.o. female with medical history significant for dementia, hypertension, hyperlipidemia stroke, NASH, depression, RBBB presented with altered mental status.  The patient was in the ED on 05/22/2024 because of falls and confusion.  The patient had an unwitnessed fall in the middle of the night 11/24-11/25.  Spouse had difficulty getting her up off the floor.  EMS was activated and the patient was brought to the emergency department.  Labs including CMP, CBC were unremarkable with a TSH 4.420.  Ammonia was 18.  UA was negative for pyuria.  CT of the brain was negative for acute findings.  CT cervical spine was negative for fracture or dislocation or subluxation.  The patient was discharged home in stable condition after being given Ativan  IV for agitation.. After pt returned home, husband gave patient's 2 more doses of Xanax   0.25 mg. On the morning of 05/23/2024, spouse had difficulty waking the patient up due to somnolence.  At baseline, the patient has cognitive impairment..  She is able to answer simple questions and speak fluently.  She is pleasantly confused.  She is able to ambulate without assistive devices.  She was able to clothe herself.  Patient has had decreased oral intake for the past 2 days.  Spouse states that the patient has not had any recent complaints.  Spouse states that the patient's cognitive impairment has been fairly stable for the last 6 months.  There is not been any new medications or changes in her medications.  No reports of fevers, chills, chest pain, shortness breath, cough, hemoptysis, vomiting, diarrhea, abdominal pain.  In the ED, the patient had low-grade temperature 100.1 F.  She was hemodynamically stable with oxygen saturation 95% room air.  WBC 14.6, hemoglobin 12.7, platelets 191.  Sodium 140, potassium 3.5, bicarbonate 23, serum  creatinine 0.82.  UA 21-50WBC She was started on IV fluids.   Assessment/Plan: Acute metabolic encephalopathy -Ammonia 19 - B12--832 - Folic acid --10.2 - TSH 4.420 - 05/22/2024 CT brain negative for acute findings - 05/22/2024 CT cervical spine negative for acute fracture or subluxation - 05/24/24--remains confused and agitated - 05/25/24--more awake and alert, pleasantly confused   UTI - UA 21-50 WBC - continue ceftriaxone  - follow urine culture--multiple organisms   Dehydration - Continue IV fluids   Mixed hyperlipidemia - Continue statin   Major neurocognitive disorder - Continue Namenda  - Continue Lexapro  - Holding Seroquel  temporarily   Essential hypertension - Previously took valsartan  - Monitor off BP meds as patient has had orthostatic hypotension   Abdominal pain -CT abd/pelvis--no hydronephrosis or perinephric stranding; Inflammatory changes in the perirectal fat with the rectum mild to moderately distended with stool, possible proctitis -11/28--abd pain improved, tolerating diet -4 BMs after bisacodyl  and miralax                  Family Communication:   husband at bedside 11/29   Consultants:  none   Code Status:  DNR   DVT Prophylaxis:   [place] Lovenox      Procedures: As Listed in Progress Note Above   Antibiotics: merrem  11/27>>          Subjective: Pt is pleasantly confused.  Denies cp, sob, n/v/d, abd pain  Objective: Vitals:   05/24/24 1322 05/24/24 1940 05/25/24 0300 05/25/24 1327  BP: 125/85 (!) 136/56 (!) 157/65 (!) 129/57  Pulse: 100  87 72 71  Resp:  18 18 17   Temp: (!) 97.5 F (36.4 C) 98.2 F (36.8 C) 98.5 F (36.9 C) 98.6 F (37 C)  TempSrc: Oral Oral Oral Oral  SpO2: 96% 93% 96% 97%  Weight:      Height:        Intake/Output Summary (Last 24 hours) at 05/25/2024 1458 Last data filed at 05/25/2024 1400 Gross per 24 hour  Intake 100 ml  Output --  Net 100 ml   Weight change:  Exam:  General:  Pt is  alert, follows commands appropriately, not in acute distress HEENT: No icterus, No thrush, No neck mass, [place]/AT Cardiovascular: RRR, S1/S2, no rubs, no gallops Respiratory: CTA bilaterally, no wheezing, no crackles, no rhonchi Abdomen: Soft/+BS, non tender, non distended, no guarding Extremities: No edema, No lymphangitis, No petechiae, No rashes, no synovitis   Data Reviewed: I have personally reviewed following labs and imaging studies Basic Metabolic Panel: Recent Labs  Lab 05/22/24 0946 05/23/24 1518 05/24/24 0426 05/25/24 0438  NA 143 140 142 141  K 4.2 3.5 3.3* 3.9  CL 109 107 110 110  CO2 21* 23 22 20*  GLUCOSE 101* 133* 119* 114*  BUN 12 16 15 13   CREATININE 0.93 0.82 0.68 0.65  CALCIUM  10.0 9.6 9.0 8.8*  MG  --  2.3  --  2.2   Liver Function Tests: Recent Labs  Lab 05/22/24 0946 05/23/24 1518  AST 26 20  ALT 14 11  ALKPHOS 116 109  BILITOT 0.7 1.0  PROT 7.3 7.2  ALBUMIN 4.5 4.2   Recent Labs  Lab 05/22/24 0946  LIPASE 23   Recent Labs  Lab 05/22/24 0946 05/23/24 1518  AMMONIA 18 19   Coagulation Profile: No results for input(s): INR, PROTIME in the last 168 hours. CBC: Recent Labs  Lab 05/22/24 0946 05/23/24 1518 05/24/24 0426 05/25/24 0438  WBC 10.4 14.6* 13.7* 12.8*  NEUTROABS 7.6 12.2*  --   --   HGB 13.5 12.7 11.1* 10.7*  HCT 39.2 37.8 33.3* 31.2*  MCV 95.8 98.7 98.2 96.9  PLT 255 191 177 184   Cardiac Enzymes: No results for input(s): CKTOTAL, CKMB, CKMBINDEX, TROPONINI in the last 168 hours. BNP: Invalid input(s): POCBNP CBG: No results for input(s): GLUCAP in the last 168 hours. HbA1C: No results for input(s): HGBA1C in the last 72 hours. Urine analysis:    Component Value Date/Time   COLORURINE YELLOW 05/24/2024 0504   APPEARANCEUR TURBID (A) 05/24/2024 0504   LABSPEC 1.029 05/24/2024 0504   PHURINE 5.0 05/24/2024 0504   GLUCOSEU NEGATIVE 05/24/2024 0504   HGBUR MODERATE (A) 05/24/2024 0504    BILIRUBINUR NEGATIVE 05/24/2024 0504   BILIRUBINUR small (A) 04/23/2024 1706   KETONESUR 5 (A) 05/24/2024 0504   PROTEINUR 100 (A) 05/24/2024 0504   UROBILINOGEN 0.2 04/23/2024 1706   NITRITE NEGATIVE 05/24/2024 0504   LEUKOCYTESUR MODERATE (A) 05/24/2024 0504   Sepsis Labs: @LABRCNTIP (procalcitonin:4,lacticidven:4) ) Recent Results (from the past 240 hours)  Resp panel by RT-PCR (RSV, Flu A&B, Covid) Anterior Nasal Swab     Status: None   Collection Time: 05/22/24  9:46 AM   Specimen: Anterior Nasal Swab  Result Value Ref Range Status   SARS Coronavirus 2 by RT PCR NEGATIVE NEGATIVE Final    Comment: (NOTE) SARS-CoV-2 target nucleic acids are NOT DETECTED.  The SARS-CoV-2 RNA is generally detectable in upper respiratory specimens during the acute phase of infection. The lowest concentration of SARS-CoV-2 viral copies this assay  can detect is 138 copies/mL. A negative result does not preclude SARS-Cov-2 infection and should not be used as the sole basis for treatment or other patient management decisions. A negative result may occur with  improper specimen collection/handling, submission of specimen other than nasopharyngeal swab, presence of viral mutation(s) within the areas targeted by this assay, and inadequate number of viral copies(<138 copies/mL). A negative result must be combined with clinical observations, patient history, and epidemiological information. The expected result is Negative.  Fact Sheet for Patients:  bloggercourse.com  Fact Sheet for Healthcare Providers:  seriousbroker.it  This test is no t yet approved or cleared by the [place]  FDA and  has been authorized for detection and/or diagnosis of SARS-CoV-2 by FDA under an Emergency Use Authorization (EUA). This EUA will remain  in effect (meaning this test can be used) for the duration of the COVID-19 declaration under Section 564(b)(1) of the Act,  21 U.S.C.section 360bbb-3(b)(1), unless the authorization is terminated  or revoked sooner.       Influenza A by PCR NEGATIVE NEGATIVE Final   Influenza B by PCR NEGATIVE NEGATIVE Final    Comment: (NOTE) The Xpert Xpress SARS-CoV-2/FLU/RSV plus assay is intended as an aid in the diagnosis of influenza from Nasopharyngeal swab specimens and should not be used as a sole basis for treatment. Nasal washings and aspirates are unacceptable for Xpert Xpress SARS-CoV-2/FLU/RSV testing.  Fact Sheet for Patients: bloggercourse.com  Fact Sheet for Healthcare Providers: seriousbroker.it  This test is not yet approved or cleared by the [place]  FDA and has been authorized for detection and/or diagnosis of SARS-CoV-2 by FDA under an Emergency Use Authorization (EUA). This EUA will remain in effect (meaning this test can be used) for the duration of the COVID-19 declaration under Section 564(b)(1) of the Act, 21 U.S.C. section 360bbb-3(b)(1), unless the authorization is terminated or revoked.     Resp Syncytial Virus by PCR NEGATIVE NEGATIVE Final    Comment: (NOTE) Fact Sheet for Patients: bloggercourse.com  Fact Sheet for Healthcare Providers: seriousbroker.it  This test is not yet approved or cleared by the [place]  FDA and has been authorized for detection and/or diagnosis of SARS-CoV-2 by FDA under an Emergency Use Authorization (EUA). This EUA will remain in effect (meaning this test can be used) for the duration of the COVID-19 declaration under Section 564(b)(1) of the Act, 21 U.S.C. section 360bbb-3(b)(1), unless the authorization is terminated or revoked.  Performed at [facility], [address], [place], [place] [address]   Blood culture (routine x 2)     Status: None (Preliminary result)   Collection Time: 05/23/24  5:44 PM   Specimen: BLOOD  Result Value Ref  Range Status   Specimen Description BLOOD BLOOD RIGHT HAND  Final   Special Requests   Final    BOTTLES DRAWN AEROBIC ONLY Blood Culture results may not be optimal due to an inadequate volume of blood received in culture bottles   Culture   Final    NO GROWTH 2 DAYS Performed at [facility], [address], [place], [place] [address]    Report Status PENDING  Incomplete  Blood culture (routine x 2)     Status: None (Preliminary result)   Collection Time: 05/23/24  5:51 PM   Specimen: BLOOD  Result Value Ref Range Status   Specimen Description BLOOD RIGHT ANTECUBITAL  Final   Special Requests   Final    BOTTLES DRAWN AEROBIC AND ANAEROBIC Blood Culture adequate volume  Culture   Final    NO GROWTH 2 DAYS Performed at [facility], [address], [place], [place] [address]    Report Status PENDING  Incomplete  Urine Culture     Status: Abnormal   Collection Time: 05/24/24  5:04 AM   Specimen: Urine, Clean Catch  Result Value Ref Range Status   Specimen Description   Final    URINE, CLEAN CATCH Performed at [facility], [address], [place], [place] [address]    Special Requests   Final    NONE Performed at [facility], [address] [place], [place] [address]    Culture MULTIPLE SPECIES PRESENT, SUGGEST RECOLLECTION (A)  Final   Report Status 05/25/2024 FINAL  Final     Scheduled Meds:  aspirin  EC  81 mg Oral Daily   enoxaparin  (LOVENOX ) injection  40 mg Subcutaneous Q24H   escitalopram   10 mg Oral Daily   memantine   10 mg Oral QHS   polyethylene glycol  17 g Oral Daily   rosuvastatin   20 mg Oral Daily   Continuous Infusions:  meropenem  (MERREM ) IV 1 g (05/25/24 0837)    Procedures/Studies: CT ABDOMEN PELVIS W CONTRAST Result Date: 05/24/2024 CLINICAL DATA:  Recurrent UTI.  Abdominal pain.  Leukocytosis. EXAM: CT ABDOMEN AND PELVIS WITH CONTRAST TECHNIQUE: Multidetector CT imaging of the abdomen and pelvis was performed using the standard protocol following  bolus administration of intravenous contrast. RADIATION DOSE REDUCTION: This exam was performed according to the departmental dose-optimization program which includes automated exposure control, adjustment of the mA and/or kV according to patient size and/or use of iterative reconstruction technique. CONTRAST:  OMNIPAQUE  IOHEXOL  300 MG/ML  SOLN COMPARISON:  04/16/2022. FINDINGS: Study mildly degraded by patient motion. Lower chest: Mild subsegmental atelectasis.  No acute findings. Hepatobiliary: No focal liver abnormality is seen. No gallstones, gallbladder wall thickening, or biliary dilatation. Pancreas: Unremarkable. No pancreatic ductal dilatation or surrounding inflammatory changes. Spleen: Normal in size without focal abnormality. Adrenals/Urinary Tract: Normal adrenal glands. Kidneys normal in size, orientation and position with symmetric enhancement and excretion. Two left renal cysts, exophytic cyst from the upper pole, 2.3 cm, and larger cortical cyst from the lower pole, 4.7 cm, both stable. No renal stones. No hydronephrosis. Ureters are normal in course and in caliber. Bladder mild to moderately distended. No bladder wall thickening, mass or stone. Stomach/Bowel: Rectum mild to moderately distended with stool. There is perirectal fat stranding, haziness and fluid attenuation. No rectal wall thickening. Stomach is unremarkable. Small bowel and colon are normal in caliber. No wall thickening or inflammation. Vascular/Lymphatic: Aortic atherosclerosis. No enlarged abdominal or pelvic lymph nodes. Reproductive: Status post hysterectomy. No adnexal masses. Other: Trace amount of presacral pelvic free fluid. Musculoskeletal: No fracture or acute finding.  No bone lesion. IMPRESSION: 1. No CT evidence of pyelonephritis or cystitis. 2. Inflammatory changes in the perirectal fat with the rectum mild to moderately distended with stool. Consider proctitis in the proper clinical setting. There is also a  trace amount of presacral pelvic free fluid. 3. No other evidence of an acute abnormality within the abdomen or pelvis. Electronically Signed   By: Alm Parkins M.D.   On: 05/24/2024 13:44   DG Chest Port 1 View Result Date: 05/23/2024 CLINICAL DATA:  Concern for aspiration. EXAM: PORTABLE CHEST 1 VIEW COMPARISON:  Chest radiograph dated 05/22/2024. FINDINGS: No focal consolidation, pleural effusion or pneumothorax. The cardiac silhouette. No acute osseous pathology. IMPRESSION: No active disease. Electronically Signed   By:  Vanetta Chou M.D.   On: 05/23/2024 17:19   DG Hip Unilat W or Wo Pelvis 2-3 Views Right Result Date: 05/23/2024 EXAM: 2 OR MORE VIEW(S) XRAY OF THE RIGHT HIP 05/23/2024 02:18:00 PM COMPARISON: None available. CLINICAL HISTORY: Right lower extremity pain and dementia. FINDINGS: BONES AND JOINTS: The patient is rotated to the left on today's radiographs. No external rotation view is provided. We have adducted and mildly abducted views of the right hip. Presumably standard positioning was problematic due to compliance issues. No fracture or acute bony findings identified. Minimal spurring of the right femoral head. The hip joint is maintained. SOFT TISSUES: The soft tissues are unremarkable. IMPRESSION: 1. No acute bony findings in the right hip. 2. Minimal degenerative spurring of the right femoral head. Electronically signed by: Ryan Salvage MD 05/23/2024 03:46 PM EST RP Workstation: [id number]   CT Head Wo Contrast Result Date: 05/22/2024 EXAM: CT HEAD WITHOUT 05/22/2024 10:49:54 AM TECHNIQUE: CT of the head was performed without the administration of intravenous contrast. Automated exposure control, iterative reconstruction, and/or weight based adjustment of the mA/kV was utilized to reduce the radiation dose to as low as reasonably achievable. COMPARISON: 03/10/2024 CLINICAL HISTORY: Head trauma, minor (Age >= 65y) FINDINGS: BRAIN AND VENTRICLES: No acute intracranial  hemorrhage. No mass effect or midline shift. No extra-axial fluid collection. No evidence of acute infarct. No hydrocephalus. Patchy periventricular and subcortical white matter hypoattenuation, likely representing chronic small vessel ischemic change. Mild ventricular and sulcal prominence secondary to age-related volume loss. Calcified atherosclerotic plaque within the cavernous/supraclinoid internal carotid arteries. ORBITS: No acute abnormality. SINUSES AND MASTOIDS: No acute abnormality. SOFT TISSUES AND SKULL: No acute skull fracture. No acute soft tissue abnormality. IMPRESSION: 1. No acute intracranial abnormality related to the head trauma. 2. Patchy periventricular and subcortical white matter hypoattenuation, likely representing chronic small vessel ischemic change. 3. Mild ventricular and sulcal prominence secondary to age-related volume loss. 4. Calcified atherosclerotic plaque within the cavernous/supraclinoid internal carotid arteries. Electronically signed by: Evalene Coho MD 05/22/2024 11:03 AM EST RP Workstation: [id number]   CT Cervical Spine Wo Contrast Result Date: 05/22/2024 CLINICAL DATA:  Neck trauma. Increased confusion and altered mental status. Multiple falls. EXAM: CT CERVICAL SPINE WITHOUT CONTRAST TECHNIQUE: Multidetector CT imaging of the cervical spine was performed without intravenous contrast. Multiplanar CT image reconstructions were also generated. RADIATION DOSE REDUCTION: This exam was performed according to the departmental dose-optimization program which includes automated exposure control, adjustment of the mA and/or kV according to patient size and/or use of iterative reconstruction technique. COMPARISON:  None Available. FINDINGS: Technical note: Despite efforts by the technologist and patient, moderate to severe motion artifact is present on today's exam and could not be eliminated. This reduces exam sensitivity and specificity. Alignment: Straightening without  focal angulation or significant listhesis. Skull base and vertebrae: No displaced cervical spine fracture or traumatic subluxation identified. Subtle osseous injuries could be obscured by the motion artifact. Soft tissues and spinal canal: No prevertebral fluid or swelling. No visible canal hematoma. Disc levels: Multilevel spondylosis with disc space narrowing, uncinate spurring and facet hypertrophy. Details are partially obscured by motion artifact. Upper chest: Clear lung apices. Other: Asymmetric degenerative changes at the left TMJ. IMPRESSION: 1. Moderate to severe motion artifact despite efforts by the technologist and patient. 2. No displaced cervical spine fracture or traumatic subluxation identified. Subtle osseous injuries could be obscured by the motion artifact. If the patient has persistent neck pain or neurological symptoms, follow-up imaging recommended. 3. Multilevel cervical spondylosis.  Electronically Signed   By: Elsie Perone M.D.   On: 05/22/2024 10:56   DG Chest Portable 1 View Result Date: 05/22/2024 EXAM: 1 VIEW(S) XRAY OF THE CHEST 05/22/2024 09:54:00 AM COMPARISON: 03/15/2024 CLINICAL HISTORY: fall FINDINGS: LUNGS AND PLEURA: No focal pulmonary opacity. No pleural effusion. No pneumothorax. HEART AND MEDIASTINUM: Aortic atherosclerosis. No acute abnormality of the cardiac and mediastinal silhouettes. BONES AND SOFT TISSUES: No acute osseous abnormality. IMPRESSION: 1. No acute process. 2. Aortic atherosclerosis. Electronically signed by: Evalene Coho MD 05/22/2024 10:14 AM EST RP Workstation: [id number] Alm Schneider, DO  [facility]  If 7PM-7AM, please contact night-coverage [email or web address] Password [email or web address] 05/25/2024, 2:58 PM   LOS: 1 day

## 2024-05-25 NOTE — Evaluation (Signed)
 Occupational Therapy Evaluation Patient Details Name: Diana Smith MRN: [id number] DOB: 1946-01-07 Today's Date: 05/25/2024   History of Present Illness   78 y.o. female with medical history significant for dementia, hypertension, hyperlipidemia stroke, NASH, depression, RBBB presented with altered mental status.  The patient was in the ED on 05/22/2024 because of falls and confusion.  The patient had an unwitnessed fall in the middle of the night 11/24-11/25.  Spouse had difficulty getting her up off the floor.  EMS was activated and the patient was brought to the emergency department.  Labs including CMP, CBC were unremarkable with a TSH 4.420.  Ammonia was 18.  UA was negative for pyuria.  CT of the brain was negative for acute findings.  CT cervical spine was negative for fracture or dislocation or subluxation.  The patient was discharged home in stable condition after being given Ativan  IV for agitation..  After pt returned home, husband gave patient's 2 more doses of Xanax   0.25 mg. On the morning of 05/23/2024, spouse had difficulty waking the patient up due to somnolence.  At baseline, the patient has cognitive impairment..  She is able to answer simple questions and speak fluently.  She is pleasantly confused.  She is able to ambulate without assistive devices.  She was able to clothe herself.  Patient has had decreased oral intake for the past 2 days.     Clinical Impressions Pt admitted for concerns listed above. PTA Pt reported that she was independent with all ADL's and functional mobility. At this time, pt is highly resistive and requiring mod to max assist with all mobility. Recommending futher skilled OT to maximize independence and safety. Acute OT will continue to follow.       If plan is discharge home, recommend the following:   A lot of help with walking and/or transfers;A lot of help with bathing/dressing/bathroom;Assistance with cooking/housework;Assist for  transportation;Help with stairs or ramp for entrance;Supervision due to cognitive status     Functional Status Assessment   Patient has had a recent decline in their functional status and demonstrates the ability to make significant improvements in function in a reasonable and predictable amount of time.     Equipment Recommendations   None recommended by OT     Recommendations for Other Services         Precautions/Restrictions   Precautions Precautions: Fall Recall of Precautions/Restrictions: Impaired Restrictions Weight Bearing Restrictions Per Provider Order: No     Mobility Bed Mobility Overal bed mobility: Needs Assistance Bed Mobility: Supine to Sit     Supine to sit: Mod assist     General bed mobility comments: Pt requiring mod A to push/pull up to sitting, highly resistive.    Transfers Overall transfer level: Needs assistance Equipment used: Rolling walker (2 wheels), 1 person hand held assist Transfers: Sit to/from Stand, Bed to chair/wheelchair/BSC Sit to Stand: Mod assist, Max assist, +2 safety/equipment     Step pivot transfers: Mod assist, Max assist     General transfer comment: Pt initially requiring max to total +2 to come to standing due to cognition and resisting, however with increased time and changing to 1 hand held assist she was able to stand and pivot to chair with mod A.      Balance Overall balance assessment: Needs assistance Sitting-balance support: Feet supported, No upper extremity supported Sitting balance-Leahy Scale: Fair Sitting balance - Comments: at time leaning back   Standing balance support: Single extremity supported, Bilateral upper extremity  supported, During functional activity, Reliant on assistive device for balance Standing balance-Leahy Scale: Fair Standing balance comment: poor/fair depending on resistance                           ADL either performed or assessed with clinical judgement    ADL Overall ADL's : Needs assistance/impaired                                       General ADL Comments: Pt has strength and mobility to complete ADL's, however due to cognition, unable to complete with out max assist +2     Vision Baseline Vision/History: 1 Wears glasses Ability to See in Adequate Light: 0 Adequate Patient Visual Report: No change from baseline Vision Assessment?: No apparent visual deficits     Perception         Praxis         Pertinent Vitals/Pain Pain Assessment Pain Assessment: Faces Faces Pain Scale: Hurts even more Pain Location: general with movement Pain Descriptors / Indicators: Discomfort Pain Intervention(s): Repositioned     Extremity/Trunk Assessment Upper Extremity Assessment Upper Extremity Assessment: Overall WFL for tasks assessed   Lower Extremity Assessment Lower Extremity Assessment: Defer to PT evaluation   Cervical / Trunk Assessment Cervical / Trunk Assessment: Normal   Communication Communication Communication: Impaired Factors Affecting Communication: Other (comment)   Cognition Arousal: Alert Behavior During Therapy: Flat affect Cognition: History of cognitive impairments                               Following commands: Impaired Following commands impaired: Follows one step commands inconsistently     Cueing  General Comments   Cueing Techniques: Verbal cues;Gestural cues;Tactile cues;Visual cues  VSS on RA   Exercises     Shoulder Instructions      Home Living Family/patient expects to be discharged to:: Private residence Living Arrangements: Spouse/significant other Available Help at Discharge: Family;Available 24 hours/day Type of Home: House Home Access: Level entry     Home Layout: One level     Bathroom Shower/Tub: Chief Strategy Officer: Standard     Home Equipment: Agricultural Consultant (2 wheels)          Prior Functioning/Environment Prior  Level of Function : Needs assist             Mobility Comments: Used no DME ADLs Comments: Able to complete BADL's independently    OT Problem List: Decreased strength;Decreased activity tolerance;Impaired balance (sitting and/or standing);Decreased range of motion;Decreased cognition;Pain   OT Treatment/Interventions: Self-care/ADL training;Therapeutic exercise;Neuromuscular education;Energy conservation;Manual therapy;DME and/or AE instruction;Therapeutic activities;Patient/family education;Balance training      OT Goals(Current goals can be found in the care plan section)   Acute Rehab OT Goals Patient Stated Goal: none stated this session OT Goal Formulation: With family Time For Goal Achievement: 06/08/24 Potential to Achieve Goals: Good ADL Goals Pt Will Perform Grooming: with supervision;standing Pt Will Perform Lower Body Bathing: with supervision;sit to/from stand;sitting/lateral leans Pt Will Perform Lower Body Dressing: with supervision;sit to/from stand;sitting/lateral leans Pt Will Transfer to Toilet: with supervision;ambulating Pt Will Perform Toileting - Clothing Manipulation and hygiene: with supervision;sit to/from stand;sitting/lateral leans   OT Frequency:  Min 1X/week    Co-evaluation  AM-PAC OT 6 Clicks Daily Activity     Outcome Measure Help from another person eating meals?: A Little Help from another person taking care of personal grooming?: A Lot Help from another person toileting, which includes using toliet, bedpan, or urinal?: A Lot Help from another person bathing (including washing, rinsing, drying)?: A Lot Help from another person to put on and taking off regular upper body clothing?: A Lot Help from another person to put on and taking off regular lower body clothing?: A Lot 6 Click Score: 13   End of Session Equipment Utilized During Treatment: Rolling walker (2 wheels);Gait belt Nurse Communication: Mobility  status  Activity Tolerance: Patient tolerated treatment well Patient left: in chair;with call bell/phone within reach;with chair alarm set;with nursing/sitter in room;with family/visitor present  OT Visit Diagnosis: Unsteadiness on feet (R26.81);Other abnormalities of gait and mobility (R26.89);Muscle weakness (generalized) (M62.81)                Time: 8975-8951 OT Time Calculation (min): 24 min Charges:  OT General Charges $OT Visit: 1 Visit OT Evaluation $OT Eval Moderate Complexity: 1 Mod  Fynn Vanblarcom, OTR/L [facility]   Sondos Wolfman Jillyn Nightingale 05/25/2024, 12:28 PM

## 2024-05-25 NOTE — Plan of Care (Signed)
  Problem: Clinical Measurements: Goal: Will remain free from infection Outcome: Progressing   Problem: Activity: Goal: Risk for activity intolerance will decrease Outcome: Progressing   Problem: Coping: Goal: Level of anxiety will decrease Outcome: Progressing   Problem: Skin Integrity: Goal: Risk for impaired skin integrity will decrease Outcome: Progressing

## 2024-05-25 NOTE — Plan of Care (Signed)
  Problem: Acute Rehab PT Goals(only PT should resolve) Goal: Pt Will Go Supine/Side To Sit Outcome: Progressing Flowsheets (Taken 05/25/2024 1613) Pt will go Supine/Side to Sit: with minimal assist Goal: Patient Will Transfer Sit To/From Stand Outcome: Progressing Flowsheets (Taken 05/25/2024 1613) Patient will transfer sit to/from stand: with minimal assist Goal: Pt Will Transfer Bed To Chair/Chair To Bed Outcome: Progressing Flowsheets (Taken 05/25/2024 1613) Pt will Transfer Bed to Chair/Chair to Bed:  with min assist  Other (comment) Goal: Pt Will Ambulate Outcome: Progressing Flowsheets (Taken 05/25/2024 1613) Pt will Ambulate:  15 feet  with moderate assist  with rolling walker   4:14 PM, 05/25/24 Lynwood Music, MPT Physical Therapist with [facility] [phone] office [phone] mobile phone

## 2024-05-25 NOTE — NC FL2 (Signed)
 [place]  MEDICAID [id number] LEVEL OF CARE FORM     IDENTIFICATION  Patient Name: Diana Smith Birthdate: Nov 01, 1945 Sex: female Admission Date (Current Location): 05/23/2024  [facility] and [place] Number:  [employer] and Address:  [facility],  [address], [place] [address]      Provider Number: [id number]  Attending Physician Name and Address:  Evonnie Lenis, MD  Relative Name and Phone Number:  Ravan, Schlemmer (Spouse)  [phone]    Current Level of Care: Hospital Recommended Level of Care: Skilled Nursing Facility Prior Approval Number:    Date Approved/Denied:   PASRR Number: Pending  Discharge Plan: SNF    Current Diagnoses: Patient Active Problem List   Diagnosis Date Noted   Acute metabolic encephalopathy 05/23/2024   Weakness 09/24/2023   Confusion 09/24/2023   Hypotension 09/23/2023   History of ischemic stroke 09/23/2023   Acute encephalopathy 09/23/2023   TIA (transient ischemic attack) 12/26/2022   Dementia due to Alzheimer's disease 09/10/2022   Abdominal pain 04/24/2022   Hypoalbuminemia due to protein-calorie malnutrition 04/24/2022   Pulmonary nodule 04/24/2022   Essential hypertension 04/24/2022   Insomnia 04/24/2022   Actinic keratosis    Allergic rhinitis due to pollen    Cerebrovascular disease    History of COVID-19    Gastro-esophageal reflux disease without esophagitis    Generalized anxiety disorder    Hyperglycemia    Hyperlipidemia    Major depressive disorder    Nonalcoholic steatohepatitis (NASH)    Overactive bladder    History of colonic polyps    Prediabetes    Right bundle branch block    Rosacea    Sciatica, right side    Slow transit constipation    Vitamin B12 deficiency (non anemic)    Vitamin D  deficiency    Osteoarthritis of right knee 01/25/2021    Orientation RESPIRATION BLADDER Height & Weight      (Disoriented x 4)  Normal Incontinent Weight: 162 lb 14.7 oz (73.9 kg) Height:  5'  5 (165.1 cm)  BEHAVIORAL SYMPTOMS/MOOD NEUROLOGICAL BOWEL NUTRITION STATUS      Incontinent Diet (See DC summary)  AMBULATORY STATUS COMMUNICATION OF NEEDS Skin   Extensive Assist Non-Verbally Normal                       Personal Care Assistance Level of Assistance  Bathing, Feeding, Dressing Bathing Assistance: Maximum assistance Feeding assistance: Limited assistance Dressing Assistance: Maximum assistance     Functional Limitations Info  Sight, Hearing, Speech Sight Info: Impaired (eyeglasses) Hearing Info: Adequate Speech Info: Impaired (incomprehensible)    SPECIAL CARE FACTORS FREQUENCY  PT (By licensed PT), OT (By licensed OT)     PT Frequency: 5x/wk OT Frequency: 5x/wk            Contractures Contractures Info: Not present    Additional Factors Info  Code Status, Allergies, Psychotropic Code Status Info: DNR Allergies Info: Ativan  (Lorazepam ), Gatifloxacin, Hydrocodone, Keflex  (Cephalexin ), Lorabid (Loracarbef), Minocycline Psychotropic Info: Lexapro          Current Medications (05/25/2024):  This is the current hospital active medication list Current Facility-Administered Medications  Medication Dose Route Frequency Provider Last Rate Last Admin   acetaminophen  (TYLENOL ) tablet 650 mg  650 mg Oral Q6H PRN Emokpae, Ejiroghene E, MD       Or   acetaminophen  (TYLENOL ) suppository 650 mg  650 mg Rectal Q6H PRN Emokpae, Ejiroghene E, MD       aspirin  EC  tablet 81 mg  81 mg Oral Daily Tat, David, MD   81 mg at 05/25/24 9166   enoxaparin  (LOVENOX ) injection 40 mg  40 mg Subcutaneous Q24H Emokpae, Ejiroghene E, MD   40 mg at 05/24/24 2104   escitalopram  (LEXAPRO ) tablet 10 mg  10 mg Oral Daily Tat, David, MD   10 mg at 05/25/24 9166   melatonin tablet 3 mg  3 mg Oral QHS PRN Jesus America, NP   3 mg at 05/24/24 2317   memantine  (NAMENDA ) tablet 10 mg  10 mg Oral QHS Tat, David, MD   10 mg at 05/24/24 2104   meropenem  (MERREM ) 1 g in sodium chloride  0.9  % 100 mL IVPB  1 g Intravenous Q12H Tat, David, MD 200 mL/hr at 05/25/24 0837 1 g at 05/25/24 9162   nystatin  cream (MYCOSTATIN ) 1 Application  1 Application Topical BID PRN Tat, Alm, MD       ondansetron  (ZOFRAN ) tablet 4 mg  4 mg Oral Q6H PRN Emokpae, Ejiroghene E, MD       Or   ondansetron  (ZOFRAN ) injection 4 mg  4 mg Intravenous Q6H PRN Emokpae, Ejiroghene E, MD       polyethylene glycol (MIRALAX  / GLYCOLAX ) packet 17 g  17 g Oral Daily Tat, David, MD   17 g at 05/24/24 1728   rosuvastatin  (CRESTOR ) tablet 20 mg  20 mg Oral Daily Tat, Alm, MD   20 mg at 05/25/24 9166     Discharge Medications: Please see discharge summary for a list of discharge medications.  Relevant Imaging Results:  Relevant Lab Results:   Additional Information SSN: [id number]  Hoy DELENA Bigness, LCSW

## 2024-05-25 NOTE — TOC Progression Note (Signed)
 Transition of Care [facility]) - Progression Note    Patient Details  Name: Diana Smith MRN: [id number] Date of Birth: [birth year]  [facility] CM/SW Contact  Hoy DELENA Bigness, LCSW Phone Number: 05/25/2024, 11:58 AM  Clinical Narrative:    Pt recommended for SNF placement. Pt's spouse agreeable to recommendation and prefers placement at [facility]. Referrals have been faxed out. PASRR requested and currently pending.                      Expected Discharge Plan and Services                                               Social Drivers of Health (SDOH) Interventions SDOH Screenings   Food Insecurity: No Food Insecurity (05/23/2024)  Housing: Low Risk  (05/23/2024)  Transportation Needs: No Transportation Needs (05/23/2024)  Utilities: Not At Risk (05/23/2024)  Social Connections: Socially Integrated (05/23/2024)  Tobacco Use: Low Risk  (05/23/2024)    Readmission Risk Interventions     No data to display

## 2024-05-26 DIAGNOSIS — G9341 Metabolic encephalopathy: Secondary | ICD-10-CM | POA: Diagnosis not present

## 2024-05-26 DIAGNOSIS — N3091 Cystitis, unspecified with hematuria: Secondary | ICD-10-CM | POA: Diagnosis not present

## 2024-05-26 DIAGNOSIS — F0390 Unspecified dementia without behavioral disturbance: Secondary | ICD-10-CM | POA: Diagnosis not present

## 2024-05-26 NOTE — Plan of Care (Signed)

## 2024-05-26 NOTE — Plan of Care (Signed)
  Problem: Health Behavior/Discharge Planning: Goal: Ability to manage health-related needs will improve Outcome: Progressing   Problem: Clinical Measurements: Goal: Ability to maintain clinical measurements within normal limits will improve Outcome: Progressing Goal: Will remain free from infection Outcome: Progressing Goal: Diagnostic test results will improve Outcome: Progressing Goal: Cardiovascular complication will be avoided Outcome: Progressing   Problem: Nutrition: Goal: Adequate nutrition will be maintained Outcome: Progressing   Problem: Coping: Goal: Level of anxiety will decrease Outcome: Progressing   Problem: Elimination: Goal: Will not experience complications related to bowel motility Outcome: Progressing   Problem: Pain Managment: Goal: General experience of comfort will improve and/or be controlled Outcome: Progressing   Problem: Safety: Goal: Ability to remain free from injury will improve Outcome: Progressing   Problem: Skin Integrity: Goal: Risk for impaired skin integrity will decrease Outcome: Progressing

## 2024-05-26 NOTE — Progress Notes (Addendum)
 PROGRESS NOTE  Diana Smith FMW:[id number] DOB: 1945-08-11 DOA: 05/23/2024 PCP: Kip Righter, MD  Brief History:  78 y.o. female with medical history significant for dementia, hypertension, hyperlipidemia stroke, NASH, depression, RBBB presented with altered mental status.  The patient was in the ED on 05/22/2024 because of falls and confusion.  The patient had an unwitnessed fall in the middle of the night 11/24-11/25.  Spouse had difficulty getting her up off the floor.  EMS was activated and the patient was brought to the emergency department.  Labs including CMP, CBC were unremarkable with a TSH 4.420.  Ammonia was 18.  UA was negative for pyuria.  CT of the brain was negative for acute findings.  CT cervical spine was negative for fracture or dislocation or subluxation.  The patient was discharged home in stable condition after being given Ativan  IV for agitation.. After pt returned home, husband gave patient's 2 more doses of Xanax   0.25 mg. On the morning of 05/23/2024, spouse had difficulty waking the patient up due to somnolence.  At baseline, the patient has cognitive impairment..  She is able to answer simple questions and speak fluently.  She is pleasantly confused.  She is able to ambulate without assistive devices.  She was able to clothe herself.  Patient has had decreased oral intake for the past 2 days.  Spouse states that the patient has not had any recent complaints.  Spouse states that the patient's cognitive impairment has been fairly stable for the last 6 months.  There is not been any new medications or changes in her medications.  No reports of fevers, chills, chest pain, shortness breath, cough, hemoptysis, vomiting, diarrhea, abdominal pain.  In the ED, the patient had low-grade temperature 100.1 F.  She was hemodynamically stable with oxygen saturation 95% room air.  WBC 14.6, hemoglobin 12.7, platelets 191.  Sodium 140, potassium 3.5, bicarbonate 23, serum  creatinine 0.82.  UA 21-50WBC She was started on IV fluids.   Assessment/Plan:  Acute metabolic encephalopathy -Ammonia 19 - B12--832 - Folic acid --10.2 - TSH 4.420 - 05/22/2024 CT brain negative for acute findings - 05/22/2024 CT cervical spine negative for acute fracture or subluxation - 05/24/24--remains confused and agitated - 05/25/24--more awake and alert, pleasantly confused - 05/26/24--back to baseline per spouse at bedside   UTI - UA 21-50 WBC - continue merrem  - follow urine culture--multiple organisms   Dehydration - Continue IV fluids>>saline lock   Mixed hyperlipidemia - Continue statin   Major neurocognitive disorder - Continue Namenda  - Continue Lexapro  - Holding Seroquel  temporarily   Essential hypertension - Previously took valsartan  - Monitor off BP meds as patient has had orthostatic hypotension   Abdominal pain -CT abd/pelvis--no hydronephrosis or perinephric stranding; Inflammatory changes in the perirectal fat with the rectum mild to moderately distended with stool, possible proctitis -11/28--abd pain improved, tolerating diet -4 BMs after bisacodyl  and miralax                  Family Communication:   husband at bedside 11/29   Consultants:  none   Code Status:  DNR   DVT Prophylaxis:   [place] Lovenox      Procedures: As Listed in Progress Note Above   Antibiotics: merrem  11/27>>            Subjective: Patient denies fevers, chills, headache, chest pain, dyspnea, nausea, vomiting, diarrhea, abdominal pain, dysuria,    Objective: Vitals:   05/25/24 1327 05/25/24  2200 05/26/24 0448 05/26/24 1326  BP: (!) 129/57 (!) 163/66 (!) 166/73 128/70  Pulse: 71 71 65 64  Resp: 17 17 19 17   Temp: 98.6 F (37 C) 98.1 F (36.7 C) (!) 97.4 F (36.3 C) 98.2 F (36.8 C)  TempSrc: Oral Oral Oral Oral  SpO2: 97% 97% 95% 94%  Weight:      Height:        Intake/Output Summary (Last 24 hours) at 05/26/2024 1745 Last data filed at  05/26/2024 1600 Gross per 24 hour  Intake 380 ml  Output --  Net 380 ml   Weight change:  Exam:  General:  Pt is alert, follows commands appropriately, not in acute distress HEENT: No icterus, No thrush, No neck mass, [place]/AT Cardiovascular: RRR, S1/S2, no rubs, no gallops Respiratory: CTA bilaterally, no wheezing, no crackles, no rhonchi Abdomen: Soft/+BS, non tender, non distended, no guarding Extremities: No edema, No lymphangitis, No petechiae, No rashes, no synovitis   Data Reviewed: I have personally reviewed following labs and imaging studies Basic Metabolic Panel: Recent Labs  Lab 05/22/24 0946 05/23/24 1518 05/24/24 0426 05/25/24 0438  NA 143 140 142 141  K 4.2 3.5 3.3* 3.9  CL 109 107 110 110  CO2 21* 23 22 20*  GLUCOSE 101* 133* 119* 114*  BUN 12 16 15 13   CREATININE 0.93 0.82 0.68 0.65  CALCIUM  10.0 9.6 9.0 8.8*  MG  --  2.3  --  2.2   Liver Function Tests: Recent Labs  Lab 05/22/24 0946 05/23/24 1518  AST 26 20  ALT 14 11  ALKPHOS 116 109  BILITOT 0.7 1.0  PROT 7.3 7.2  ALBUMIN 4.5 4.2   Recent Labs  Lab 05/22/24 0946  LIPASE 23   Recent Labs  Lab 05/22/24 0946 05/23/24 1518  AMMONIA 18 19   Coagulation Profile: No results for input(s): INR, PROTIME in the last 168 hours. CBC: Recent Labs  Lab 05/22/24 0946 05/23/24 1518 05/24/24 0426 05/25/24 0438  WBC 10.4 14.6* 13.7* 12.8*  NEUTROABS 7.6 12.2*  --   --   HGB 13.5 12.7 11.1* 10.7*  HCT 39.2 37.8 33.3* 31.2*  MCV 95.8 98.7 98.2 96.9  PLT 255 191 177 184   Cardiac Enzymes: No results for input(s): CKTOTAL, CKMB, CKMBINDEX, TROPONINI in the last 168 hours. BNP: Invalid input(s): POCBNP CBG: No results for input(s): GLUCAP in the last 168 hours. HbA1C: No results for input(s): HGBA1C in the last 72 hours. Urine analysis:    Component Value Date/Time   COLORURINE YELLOW 05/24/2024 0504   APPEARANCEUR TURBID (A) 05/24/2024 0504   LABSPEC 1.029 05/24/2024  0504   PHURINE 5.0 05/24/2024 0504   GLUCOSEU NEGATIVE 05/24/2024 0504   HGBUR MODERATE (A) 05/24/2024 0504   BILIRUBINUR NEGATIVE 05/24/2024 0504   BILIRUBINUR small (A) 04/23/2024 1706   KETONESUR 5 (A) 05/24/2024 0504   PROTEINUR 100 (A) 05/24/2024 0504   UROBILINOGEN 0.2 04/23/2024 1706   NITRITE NEGATIVE 05/24/2024 0504   LEUKOCYTESUR MODERATE (A) 05/24/2024 0504   Sepsis Labs: @LABRCNTIP (procalcitonin:4,lacticidven:4) ) Recent Results (from the past 240 hours)  Resp panel by RT-PCR (RSV, Flu A&B, Covid) Anterior Nasal Swab     Status: None   Collection Time: 05/22/24  9:46 AM   Specimen: Anterior Nasal Swab  Result Value Ref Range Status   SARS Coronavirus 2 by RT PCR NEGATIVE NEGATIVE Final    Comment: (NOTE) SARS-CoV-2 target nucleic acids are NOT DETECTED.  The SARS-CoV-2 RNA is generally detectable in upper  respiratory specimens during the acute phase of infection. The lowest concentration of SARS-CoV-2 viral copies this assay can detect is 138 copies/mL. A negative result does not preclude SARS-Cov-2 infection and should not be used as the sole basis for treatment or other patient management decisions. A negative result may occur with  improper specimen collection/handling, submission of specimen other than nasopharyngeal swab, presence of viral mutation(s) within the areas targeted by this assay, and inadequate number of viral copies(<138 copies/mL). A negative result must be combined with clinical observations, patient history, and epidemiological information. The expected result is Negative.  Fact Sheet for Patients:  bloggercourse.com  Fact Sheet for Healthcare Providers:  seriousbroker.it  This test is no t yet approved or cleared by the [place]  FDA and  has been authorized for detection and/or diagnosis of SARS-CoV-2 by FDA under an Emergency Use Authorization (EUA). This EUA will remain  in effect  (meaning this test can be used) for the duration of the COVID-19 declaration under Section 564(b)(1) of the Act, 21 U.S.C.section 360bbb-3(b)(1), unless the authorization is terminated  or revoked sooner.       Influenza A by PCR NEGATIVE NEGATIVE Final   Influenza B by PCR NEGATIVE NEGATIVE Final    Comment: (NOTE) The Xpert Xpress SARS-CoV-2/FLU/RSV plus assay is intended as an aid in the diagnosis of influenza from Nasopharyngeal swab specimens and should not be used as a sole basis for treatment. Nasal washings and aspirates are unacceptable for Xpert Xpress SARS-CoV-2/FLU/RSV testing.  Fact Sheet for Patients: bloggercourse.com  Fact Sheet for Healthcare Providers: seriousbroker.it  This test is not yet approved or cleared by the [place]  FDA and has been authorized for detection and/or diagnosis of SARS-CoV-2 by FDA under an Emergency Use Authorization (EUA). This EUA will remain in effect (meaning this test can be used) for the duration of the COVID-19 declaration under Section 564(b)(1) of the Act, 21 U.S.C. section 360bbb-3(b)(1), unless the authorization is terminated or revoked.     Resp Syncytial Virus by PCR NEGATIVE NEGATIVE Final    Comment: (NOTE) Fact Sheet for Patients: bloggercourse.com  Fact Sheet for Healthcare Providers: seriousbroker.it  This test is not yet approved or cleared by the [place]  FDA and has been authorized for detection and/or diagnosis of SARS-CoV-2 by FDA under an Emergency Use Authorization (EUA). This EUA will remain in effect (meaning this test can be used) for the duration of the COVID-19 declaration under Section 564(b)(1) of the Act, 21 U.S.C. section 360bbb-3(b)(1), unless the authorization is terminated or revoked.  Performed at [facility], [address] [place], [place] [address]   Blood culture (routine x 2)      Status: None (Preliminary result)   Collection Time: 05/23/24  5:44 PM   Specimen: BLOOD  Result Value Ref Range Status   Specimen Description BLOOD BLOOD RIGHT HAND  Final   Special Requests   Final    BOTTLES DRAWN AEROBIC ONLY Blood Culture results may not be optimal due to an inadequate volume of blood received in culture bottles   Culture   Final    NO GROWTH 3 DAYS Performed at [facility], [address], [place], [place] [address]    Report Status PENDING  Incomplete  Blood culture (routine x 2)     Status: None (Preliminary result)   Collection Time: 05/23/24  5:51 PM   Specimen: BLOOD  Result Value Ref Range Status   Specimen Description BLOOD RIGHT ANTECUBITAL  Final   Special Requests  Final    BOTTLES DRAWN AEROBIC AND ANAEROBIC Blood Culture adequate volume   Culture   Final    NO GROWTH 3 DAYS Performed at [facility], [address], [place], [place] [address]    Report Status PENDING  Incomplete  Urine Culture     Status: Abnormal   Collection Time: 05/24/24  5:04 AM   Specimen: Urine, Clean Catch  Result Value Ref Range Status   Specimen Description   Final    URINE, CLEAN CATCH Performed at [facility], [address], [place], [place] [address]    Special Requests   Final    NONE Performed at [facility], [address], [place], [place] [address]    Culture MULTIPLE SPECIES PRESENT, SUGGEST RECOLLECTION (A)  Final   Report Status 05/25/2024 FINAL  Final     Scheduled Meds:  aspirin  EC  81 mg Oral Daily   enoxaparin  (LOVENOX ) injection  40 mg Subcutaneous Q24H   escitalopram   10 mg Oral Daily   memantine   10 mg Oral QHS   polyethylene glycol  17 g Oral Daily   rosuvastatin   20 mg Oral Daily   Continuous Infusions:  meropenem  (MERREM ) IV Stopped (05/26/24 1636)    Procedures/Studies: CT ABDOMEN PELVIS W CONTRAST Result Date: 05/24/2024 CLINICAL DATA:  Recurrent UTI.  Abdominal pain.  Leukocytosis. EXAM: CT ABDOMEN AND PELVIS WITH CONTRAST  TECHNIQUE: Multidetector CT imaging of the abdomen and pelvis was performed using the standard protocol following bolus administration of intravenous contrast. RADIATION DOSE REDUCTION: This exam was performed according to the departmental dose-optimization program which includes automated exposure control, adjustment of the mA and/or kV according to patient size and/or use of iterative reconstruction technique. CONTRAST:  OMNIPAQUE  IOHEXOL  300 MG/ML  SOLN COMPARISON:  04/16/2022. FINDINGS: Study mildly degraded by patient motion. Lower chest: Mild subsegmental atelectasis.  No acute findings. Hepatobiliary: No focal liver abnormality is seen. No gallstones, gallbladder wall thickening, or biliary dilatation. Pancreas: Unremarkable. No pancreatic ductal dilatation or surrounding inflammatory changes. Spleen: Normal in size without focal abnormality. Adrenals/Urinary Tract: Normal adrenal glands. Kidneys normal in size, orientation and position with symmetric enhancement and excretion. Two left renal cysts, exophytic cyst from the upper pole, 2.3 cm, and larger cortical cyst from the lower pole, 4.7 cm, both stable. No renal stones. No hydronephrosis. Ureters are normal in course and in caliber. Bladder mild to moderately distended. No bladder wall thickening, mass or stone. Stomach/Bowel: Rectum mild to moderately distended with stool. There is perirectal fat stranding, haziness and fluid attenuation. No rectal wall thickening. Stomach is unremarkable. Small bowel and colon are normal in caliber. No wall thickening or inflammation. Vascular/Lymphatic: Aortic atherosclerosis. No enlarged abdominal or pelvic lymph nodes. Reproductive: Status post hysterectomy. No adnexal masses. Other: Trace amount of presacral pelvic free fluid. Musculoskeletal: No fracture or acute finding.  No bone lesion. IMPRESSION: 1. No CT evidence of pyelonephritis or cystitis. 2. Inflammatory changes in the perirectal fat with the  rectum mild to moderately distended with stool. Consider proctitis in the proper clinical setting. There is also a trace amount of presacral pelvic free fluid. 3. No other evidence of an acute abnormality within the abdomen or pelvis. Electronically Signed   By: Alm Parkins M.D.   On: 05/24/2024 13:44   DG Chest Port 1 View Result Date: 05/23/2024 CLINICAL DATA:  Concern for aspiration. EXAM: PORTABLE CHEST 1 VIEW COMPARISON:  Chest radiograph dated 05/22/2024. FINDINGS: No focal consolidation, pleural effusion or pneumothorax. The cardiac  silhouette. No acute osseous pathology. IMPRESSION: No active disease. Electronically Signed   By: Vanetta Chou M.D.   On: 05/23/2024 17:19   DG Hip Unilat W or Wo Pelvis 2-3 Views Right Result Date: 05/23/2024 EXAM: 2 OR MORE VIEW(S) XRAY OF THE RIGHT HIP 05/23/2024 02:18:00 PM COMPARISON: None available. CLINICAL HISTORY: Right lower extremity pain and dementia. FINDINGS: BONES AND JOINTS: The patient is rotated to the left on today's radiographs. No external rotation view is provided. We have adducted and mildly abducted views of the right hip. Presumably standard positioning was problematic due to compliance issues. No fracture or acute bony findings identified. Minimal spurring of the right femoral head. The hip joint is maintained. SOFT TISSUES: The soft tissues are unremarkable. IMPRESSION: 1. No acute bony findings in the right hip. 2. Minimal degenerative spurring of the right femoral head. Electronically signed by: Ryan Salvage MD 05/23/2024 03:46 PM EST RP Workstation: [id number]   CT Head Wo Contrast Result Date: 05/22/2024 EXAM: CT HEAD WITHOUT 05/22/2024 10:49:54 AM TECHNIQUE: CT of the head was performed without the administration of intravenous contrast. Automated exposure control, iterative reconstruction, and/or weight based adjustment of the mA/kV was utilized to reduce the radiation dose to as low as reasonably achievable. COMPARISON:  03/10/2024 CLINICAL HISTORY: Head trauma, minor (Age >= 65y) FINDINGS: BRAIN AND VENTRICLES: No acute intracranial hemorrhage. No mass effect or midline shift. No extra-axial fluid collection. No evidence of acute infarct. No hydrocephalus. Patchy periventricular and subcortical white matter hypoattenuation, likely representing chronic small vessel ischemic change. Mild ventricular and sulcal prominence secondary to age-related volume loss. Calcified atherosclerotic plaque within the cavernous/supraclinoid internal carotid arteries. ORBITS: No acute abnormality. SINUSES AND MASTOIDS: No acute abnormality. SOFT TISSUES AND SKULL: No acute skull fracture. No acute soft tissue abnormality. IMPRESSION: 1. No acute intracranial abnormality related to the head trauma. 2. Patchy periventricular and subcortical white matter hypoattenuation, likely representing chronic small vessel ischemic change. 3. Mild ventricular and sulcal prominence secondary to age-related volume loss. 4. Calcified atherosclerotic plaque within the cavernous/supraclinoid internal carotid arteries. Electronically signed by: Evalene Coho MD 05/22/2024 11:03 AM EST RP Workstation: [id number]   CT Cervical Spine Wo Contrast Result Date: 05/22/2024 CLINICAL DATA:  Neck trauma. Increased confusion and altered mental status. Multiple falls. EXAM: CT CERVICAL SPINE WITHOUT CONTRAST TECHNIQUE: Multidetector CT imaging of the cervical spine was performed without intravenous contrast. Multiplanar CT image reconstructions were also generated. RADIATION DOSE REDUCTION: This exam was performed according to the departmental dose-optimization program which includes automated exposure control, adjustment of the mA and/or kV according to patient size and/or use of iterative reconstruction technique. COMPARISON:  None Available. FINDINGS: Technical note: Despite efforts by the technologist and patient, moderate to severe motion artifact is present on today's  exam and could not be eliminated. This reduces exam sensitivity and specificity. Alignment: Straightening without focal angulation or significant listhesis. Skull base and vertebrae: No displaced cervical spine fracture or traumatic subluxation identified. Subtle osseous injuries could be obscured by the motion artifact. Soft tissues and spinal canal: No prevertebral fluid or swelling. No visible canal hematoma. Disc levels: Multilevel spondylosis with disc space narrowing, uncinate spurring and facet hypertrophy. Details are partially obscured by motion artifact. Upper chest: Clear lung apices. Other: Asymmetric degenerative changes at the left TMJ. IMPRESSION: 1. Moderate to severe motion artifact despite efforts by the technologist and patient. 2. No displaced cervical spine fracture or traumatic subluxation identified. Subtle osseous injuries could be obscured by the motion artifact. If the patient  has persistent neck pain or neurological symptoms, follow-up imaging recommended. 3. Multilevel cervical spondylosis. Electronically Signed   By: Elsie Perone M.D.   On: 05/22/2024 10:56   DG Chest Portable 1 View Result Date: 05/22/2024 EXAM: 1 VIEW(S) XRAY OF THE CHEST 05/22/2024 09:54:00 AM COMPARISON: 03/15/2024 CLINICAL HISTORY: fall FINDINGS: LUNGS AND PLEURA: No focal pulmonary opacity. No pleural effusion. No pneumothorax. HEART AND MEDIASTINUM: Aortic atherosclerosis. No acute abnormality of the cardiac and mediastinal silhouettes. BONES AND SOFT TISSUES: No acute osseous abnormality. IMPRESSION: 1. No acute process. 2. Aortic atherosclerosis. Electronically signed by: Evalene Coho MD 05/22/2024 10:14 AM EST RP Workstation: [email or web address] Alm Schneider, DO  [facility]  If 7PM-7AM, please contact night-coverage [email or web address] Password [email or web address] 05/26/2024, 5:45 PM   LOS: 2 days

## 2024-05-26 NOTE — TOC Transition Note (Signed)
 Transition of Care [facility]) - Discharge Note   Patient Details  Name: Diana Smith MRN: [id number] Date of Birth: 1945/12/16  [facility] CM/SW Contact:  Nancee Powell BIRCH, LCSW Phone Number: 05/26/2024, 12:23 PM   Clinical Narrative:    Bed offer from [facility] provided to spouse who was at bedside. Mr. Nokes wants to wait to see if additional bed offers are made before making a decision.          Patient Goals and CMS Choice            Discharge Placement                       Discharge Plan and Services Additional resources added to the After Visit Summary for                                       Social Drivers of Health (SDOH) Interventions SDOH Screenings   Food Insecurity: No Food Insecurity (05/23/2024)  Housing: Low Risk  (05/23/2024)  Transportation Needs: No Transportation Needs (05/23/2024)  Utilities: Not At Risk (05/23/2024)  Social Connections: Socially Integrated (05/23/2024)  Tobacco Use: Low Risk  (05/23/2024)     Readmission Risk Interventions     No data to display

## 2024-05-27 DIAGNOSIS — I10 Essential (primary) hypertension: Secondary | ICD-10-CM | POA: Diagnosis not present

## 2024-05-27 DIAGNOSIS — G9341 Metabolic encephalopathy: Secondary | ICD-10-CM | POA: Diagnosis not present

## 2024-05-27 DIAGNOSIS — F0390 Unspecified dementia without behavioral disturbance: Secondary | ICD-10-CM | POA: Diagnosis not present

## 2024-05-27 DIAGNOSIS — N3091 Cystitis, unspecified with hematuria: Secondary | ICD-10-CM | POA: Diagnosis not present

## 2024-05-27 MED ORDER — GERHARDT'S BUTT CREAM
TOPICAL_CREAM | Freq: Two times a day (BID) | CUTANEOUS | Status: DC
  Filled 2024-05-27: qty 60

## 2024-05-27 NOTE — Plan of Care (Signed)
   Problem: Activity: Goal: Risk for activity intolerance will decrease Outcome: Progressing   Problem: Coping: Goal: Level of anxiety will decrease Outcome: Progressing

## 2024-05-27 NOTE — Progress Notes (Signed)
 PROGRESS NOTE  Diana Smith FMW:[id number] DOB: [birth year] DOA: 05/23/2024 PCP: Kip Righter, MD  Brief History:  78 y.o. female with medical history significant for dementia, hypertension, hyperlipidemia stroke, NASH, depression, RBBB presented with altered mental status.  The patient was in the ED on 05/22/2024 because of falls and confusion.  The patient had an unwitnessed fall in the middle of the night 11/24-11/25.  Spouse had difficulty getting her up off the floor.  EMS was activated and the patient was brought to the emergency department.  Labs including CMP, CBC were unremarkable with a TSH 4.420.  Ammonia was 18.  UA was negative for pyuria.  CT of the brain was negative for acute findings.  CT cervical spine was negative for fracture or dislocation or subluxation.  The patient was discharged home in stable condition after being given Ativan  IV for agitation.. After pt returned home, husband gave patient's 2 more doses of Xanax   0.25 mg. On the morning of 05/23/2024, spouse had difficulty waking the patient up due to somnolence.  At baseline, the patient has cognitive impairment..  She is able to answer simple questions and speak fluently.  She is pleasantly confused.  She is able to ambulate without assistive devices.  She was able to clothe herself.  Patient has had decreased oral intake for the past 2 days.  Spouse states that the patient has not had any recent complaints.  Spouse states that the patient's cognitive impairment has been fairly stable for the last 6 months.  There is not been any new medications or changes in her medications.  No reports of fevers, chills, chest pain, shortness breath, cough, hemoptysis, vomiting, diarrhea, abdominal pain.  In the ED, the patient had low-grade temperature 100.1 F.  She was hemodynamically stable with oxygen saturation 95% room air.  WBC 14.6, hemoglobin 12.7, platelets 191.  Sodium 140, potassium 3.5, bicarbonate 23, serum  creatinine 0.82.  UA 21-50WBC She was started on IV fluids.   Assessment/Plan:   Acute metabolic encephalopathy -Ammonia 19 - B12--832 - Folic acid --10.2 - TSH 4.420 - 05/22/2024 CT brain negative for acute findings - 05/22/2024 CT cervical spine negative for acute fracture or subluxation - 05/24/24--remains confused and agitated - 05/25/24--more awake and alert, pleasantly confused - 05/26/24--back to baseline per spouse at bedside   UTI - UA 21-50 WBC - continue merrem  - follow urine culture--multiple organisms   Dehydration - Continue IV fluids>>saline lock   Mixed hyperlipidemia - Continue statin   Major neurocognitive disorder - Continue Namenda  - Continue Lexapro  - Holding Seroquel  temporarily   Essential hypertension - Previously took valsartan  - Monitor off BP meds as patient has had orthostatic hypotension   Abdominal pain -CT abd/pelvis--no hydronephrosis or perinephric stranding; Inflammatory changes in the perirectal fat with the rectum mild to moderately distended with stool, possible proctitis -11/28--abd pain improved, tolerating diet -4 BMs after bisacodyl  and miralax  - abdominal pain resolved                 Family Communication:   husband at bedside 11/30   Consultants:  none   Code Status:  DNR   DVT Prophylaxis:   [place] Lovenox      Procedures: As Listed in Progress Note Above   Antibiotics: merrem  11/27>>           Subjective: Patient denies fevers, chills, headache, chest pain, dyspnea, nausea, vomiting, diarrhea, abdominal pain, dysuria,    Objective: Vitals:  05/26/24 0448 05/26/24 1326 05/26/24 2040 05/27/24 0500  BP: (!) 166/73 128/70 (!) 162/69 (!) 186/64  Pulse: 65 64 77 64  Resp: 19 17 18 18   Temp: (!) 97.4 F (36.3 C) 98.2 F (36.8 C) 98.7 F (37.1 C) 98.6 F (37 C)  TempSrc: Oral Oral Oral   SpO2: 95% 94% 93% 94%  Weight:      Height:        Intake/Output Summary (Last 24 hours) at 05/27/2024  1624 Last data filed at 05/27/2024 1320 Gross per 24 hour  Intake 180 ml  Output --  Net 180 ml   Weight change:  Exam:  General:  Pt is alert, follows commands appropriately, not in acute distress HEENT: No icterus, No thrush, No neck mass, Roland/AT Cardiovascular: RRR, S1/S2, no rubs, no gallops Respiratory: CTA bilaterally, no wheezing, no crackles, no rhonchi Abdomen: Soft/+BS, non tender, non distended, no guarding Extremities: No edema, No lymphangitis, No petechiae, No rashes, no synovitis   Data Reviewed: I have personally reviewed following labs and imaging studies Basic Metabolic Panel: Recent Labs  Lab 05/22/24 0946 05/23/24 1518 05/24/24 0426 05/25/24 0438  NA 143 140 142 141  K 4.2 3.5 3.3* 3.9  CL 109 107 110 110  CO2 21* 23 22 20*  GLUCOSE 101* 133* 119* 114*  BUN 12 16 15 13   CREATININE 0.93 0.82 0.68 0.65  CALCIUM  10.0 9.6 9.0 8.8*  MG  --  2.3  --  2.2   Liver Function Tests: Recent Labs  Lab 05/22/24 0946 05/23/24 1518  AST 26 20  ALT 14 11  ALKPHOS 116 109  BILITOT 0.7 1.0  PROT 7.3 7.2  ALBUMIN 4.5 4.2   Recent Labs  Lab 05/22/24 0946  LIPASE 23   Recent Labs  Lab 05/22/24 0946 05/23/24 1518  AMMONIA 18 19   Coagulation Profile: No results for input(s): INR, PROTIME in the last 168 hours. CBC: Recent Labs  Lab 05/22/24 0946 05/23/24 1518 05/24/24 0426 05/25/24 0438  WBC 10.4 14.6* 13.7* 12.8*  NEUTROABS 7.6 12.2*  --   --   HGB 13.5 12.7 11.1* 10.7*  HCT 39.2 37.8 33.3* 31.2*  MCV 95.8 98.7 98.2 96.9  PLT 255 191 177 184   Cardiac Enzymes: No results for input(s): CKTOTAL, CKMB, CKMBINDEX, TROPONINI in the last 168 hours. BNP: Invalid input(s): POCBNP CBG: No results for input(s): GLUCAP in the last 168 hours. HbA1C: No results for input(s): HGBA1C in the last 72 hours. Urine analysis:    Component Value Date/Time   COLORURINE YELLOW 05/24/2024 0504   APPEARANCEUR TURBID (A) 05/24/2024 0504    LABSPEC 1.029 05/24/2024 0504   PHURINE 5.0 05/24/2024 0504   GLUCOSEU NEGATIVE 05/24/2024 0504   HGBUR MODERATE (A) 05/24/2024 0504   BILIRUBINUR NEGATIVE 05/24/2024 0504   BILIRUBINUR small (A) 04/23/2024 1706   KETONESUR 5 (A) 05/24/2024 0504   PROTEINUR 100 (A) 05/24/2024 0504   UROBILINOGEN 0.2 04/23/2024 1706   NITRITE NEGATIVE 05/24/2024 0504   LEUKOCYTESUR MODERATE (A) 05/24/2024 0504   Sepsis Labs: @LABRCNTIP (procalcitonin:4,lacticidven:4) ) Recent Results (from the past 240 hours)  Resp panel by RT-PCR (RSV, Flu A&B, Covid) Anterior Nasal Swab     Status: None   Collection Time: 05/22/24  9:46 AM   Specimen: Anterior Nasal Swab  Result Value Ref Range Status   SARS Coronavirus 2 by RT PCR NEGATIVE NEGATIVE Final    Comment: (NOTE) SARS-CoV-2 target nucleic acids are NOT DETECTED.  The SARS-CoV-2 RNA is generally  detectable in upper respiratory specimens during the acute phase of infection. The lowest concentration of SARS-CoV-2 viral copies this assay can detect is 138 copies/mL. A negative result does not preclude SARS-Cov-2 infection and should not be used as the sole basis for treatment or other patient management decisions. A negative result may occur with  improper specimen collection/handling, submission of specimen other than nasopharyngeal swab, presence of viral mutation(s) within the areas targeted by this assay, and inadequate number of viral copies(<138 copies/mL). A negative result must be combined with clinical observations, patient history, and epidemiological information. The expected result is Negative.  Fact Sheet for Patients:  bloggercourse.com  Fact Sheet for Healthcare Providers:  seriousbroker.it  This test is no t yet approved or cleared by the [place]  FDA and  has been authorized for detection and/or diagnosis of SARS-CoV-2 by FDA under an Emergency Use Authorization (EUA). This EUA  will remain  in effect (meaning this test can be used) for the duration of the COVID-19 declaration under Section 564(b)(1) of the Act, 21 U.S.C.section 360bbb-3(b)(1), unless the authorization is terminated  or revoked sooner.       Influenza A by PCR NEGATIVE NEGATIVE Final   Influenza B by PCR NEGATIVE NEGATIVE Final    Comment: (NOTE) The Xpert Xpress SARS-CoV-2/FLU/RSV plus assay is intended as an aid in the diagnosis of influenza from Nasopharyngeal swab specimens and should not be used as a sole basis for treatment. Nasal washings and aspirates are unacceptable for Xpert Xpress SARS-CoV-2/FLU/RSV testing.  Fact Sheet for Patients: bloggercourse.com  Fact Sheet for Healthcare Providers: seriousbroker.it  This test is not yet approved or cleared by the [place]  FDA and has been authorized for detection and/or diagnosis of SARS-CoV-2 by FDA under an Emergency Use Authorization (EUA). This EUA will remain in effect (meaning this test can be used) for the duration of the COVID-19 declaration under Section 564(b)(1) of the Act, 21 U.S.C. section 360bbb-3(b)(1), unless the authorization is terminated or revoked.     Resp Syncytial Virus by PCR NEGATIVE NEGATIVE Final    Comment: (NOTE) Fact Sheet for Patients: bloggercourse.com  Fact Sheet for Healthcare Providers: seriousbroker.it  This test is not yet approved or cleared by the [place]  FDA and has been authorized for detection and/or diagnosis of SARS-CoV-2 by FDA under an Emergency Use Authorization (EUA). This EUA will remain in effect (meaning this test can be used) for the duration of the COVID-19 declaration under Section 564(b)(1) of the Act, 21 U.S.C. section 360bbb-3(b)(1), unless the authorization is terminated or revoked.  Performed at [facility], [address], [place], [place] [address]   Blood  culture (routine x 2)     Status: None (Preliminary result)   Collection Time: 05/23/24  5:44 PM   Specimen: BLOOD  Result Value Ref Range Status   Specimen Description BLOOD BLOOD RIGHT HAND  Final   Special Requests   Final    BOTTLES DRAWN AEROBIC ONLY Blood Culture results may not be optimal due to an inadequate volume of blood received in culture bottles   Culture   Final    NO GROWTH 4 DAYS Performed at [facility], [address], [place], [place] [address]    Report Status PENDING  Incomplete  Blood culture (routine x 2)     Status: None (Preliminary result)   Collection Time: 05/23/24  5:51 PM   Specimen: BLOOD  Result Value Ref Range Status   Specimen Description BLOOD RIGHT ANTECUBITAL  Final  Special Requests   Final    BOTTLES DRAWN AEROBIC AND ANAEROBIC Blood Culture adequate volume   Culture   Final    NO GROWTH 4 DAYS Performed at [facility], [address], [place], [place] [address]    Report Status PENDING  Incomplete  Urine Culture     Status: Abnormal   Collection Time: 05/24/24  5:04 AM   Specimen: Urine, Clean Catch  Result Value Ref Range Status   Specimen Description   Final    URINE, CLEAN CATCH Performed at [facility], [address] [place], [place] [address]    Special Requests   Final    NONE Performed at [facility], [address], [place], [place] [address]    Culture MULTIPLE SPECIES PRESENT, SUGGEST RECOLLECTION (A)  Final   Report Status 05/25/2024 FINAL  Final     Scheduled Meds:  aspirin  EC  81 mg Oral Daily   enoxaparin  (LOVENOX ) injection  40 mg Subcutaneous Q24H   escitalopram   10 mg Oral Daily   Gerhardt's butt cream   Topical BID   memantine   10 mg Oral QHS   polyethylene glycol  17 g Oral Daily   rosuvastatin   20 mg Oral Daily   Continuous Infusions:  meropenem  (MERREM ) IV 1 g (05/27/24 0833)    Procedures/Studies: CT ABDOMEN PELVIS W CONTRAST Result Date: 05/24/2024 CLINICAL DATA:  Recurrent UTI.  Abdominal pain.   Leukocytosis. EXAM: CT ABDOMEN AND PELVIS WITH CONTRAST TECHNIQUE: Multidetector CT imaging of the abdomen and pelvis was performed using the standard protocol following bolus administration of intravenous contrast. RADIATION DOSE REDUCTION: This exam was performed according to the departmental dose-optimization program which includes automated exposure control, adjustment of the mA and/or kV according to patient size and/or use of iterative reconstruction technique. CONTRAST:  OMNIPAQUE  IOHEXOL  300 MG/ML  SOLN COMPARISON:  04/16/2022. FINDINGS: Study mildly degraded by patient motion. Lower chest: Mild subsegmental atelectasis.  No acute findings. Hepatobiliary: No focal liver abnormality is seen. No gallstones, gallbladder wall thickening, or biliary dilatation. Pancreas: Unremarkable. No pancreatic ductal dilatation or surrounding inflammatory changes. Spleen: Normal in size without focal abnormality. Adrenals/Urinary Tract: Normal adrenal glands. Kidneys normal in size, orientation and position with symmetric enhancement and excretion. Two left renal cysts, exophytic cyst from the upper pole, 2.3 cm, and larger cortical cyst from the lower pole, 4.7 cm, both stable. No renal stones. No hydronephrosis. Ureters are normal in course and in caliber. Bladder mild to moderately distended. No bladder wall thickening, mass or stone. Stomach/Bowel: Rectum mild to moderately distended with stool. There is perirectal fat stranding, haziness and fluid attenuation. No rectal wall thickening. Stomach is unremarkable. Small bowel and colon are normal in caliber. No wall thickening or inflammation. Vascular/Lymphatic: Aortic atherosclerosis. No enlarged abdominal or pelvic lymph nodes. Reproductive: Status post hysterectomy. No adnexal masses. Other: Trace amount of presacral pelvic free fluid. Musculoskeletal: No fracture or acute finding.  No bone lesion. IMPRESSION: 1. No CT evidence of pyelonephritis or cystitis. 2.  Inflammatory changes in the perirectal fat with the rectum mild to moderately distended with stool. Consider proctitis in the proper clinical setting. There is also a trace amount of presacral pelvic free fluid. 3. No other evidence of an acute abnormality within the abdomen or pelvis. Electronically Signed   By: Alm Parkins M.D.   On: 05/24/2024 13:44   DG Chest Port 1 View Result Date: 05/23/2024 CLINICAL DATA:  Concern for aspiration. EXAM: PORTABLE CHEST 1 VIEW COMPARISON:  Chest radiograph dated 05/22/2024. FINDINGS: No focal consolidation, pleural effusion or pneumothorax. The cardiac silhouette. No acute osseous pathology. IMPRESSION: No active disease. Electronically Signed   By: Vanetta Chou M.D.   On: 05/23/2024 17:19   DG Hip Unilat W or Wo Pelvis 2-3 Views Right Result Date: 05/23/2024 EXAM: 2 OR MORE VIEW(S) XRAY OF THE RIGHT HIP 05/23/2024 02:18:00 PM COMPARISON: None available. CLINICAL HISTORY: Right lower extremity pain and dementia. FINDINGS: BONES AND JOINTS: The patient is rotated to the left on today's radiographs. No external rotation view is provided. We have adducted and mildly abducted views of the right hip. Presumably standard positioning was problematic due to compliance issues. No fracture or acute bony findings identified. Minimal spurring of the right femoral head. The hip joint is maintained. SOFT TISSUES: The soft tissues are unremarkable. IMPRESSION: 1. No acute bony findings in the right hip. 2. Minimal degenerative spurring of the right femoral head. Electronically signed by: Ryan Salvage MD 05/23/2024 03:46 PM EST RP Workstation: [id number]   CT Head Wo Contrast Result Date: 05/22/2024 EXAM: CT HEAD WITHOUT 05/22/2024 10:49:54 AM TECHNIQUE: CT of the head was performed without the administration of intravenous contrast. Automated exposure control, iterative reconstruction, and/or weight based adjustment of the mA/kV was utilized to reduce the radiation dose  to as low as reasonably achievable. COMPARISON: 03/10/2024 CLINICAL HISTORY: Head trauma, minor (Age >= 65y) FINDINGS: BRAIN AND VENTRICLES: No acute intracranial hemorrhage. No mass effect or midline shift. No extra-axial fluid collection. No evidence of acute infarct. No hydrocephalus. Patchy periventricular and subcortical white matter hypoattenuation, likely representing chronic small vessel ischemic change. Mild ventricular and sulcal prominence secondary to age-related volume loss. Calcified atherosclerotic plaque within the cavernous/supraclinoid internal carotid arteries. ORBITS: No acute abnormality. SINUSES AND MASTOIDS: No acute abnormality. SOFT TISSUES AND SKULL: No acute skull fracture. No acute soft tissue abnormality. IMPRESSION: 1. No acute intracranial abnormality related to the head trauma. 2. Patchy periventricular and subcortical white matter hypoattenuation, likely representing chronic small vessel ischemic change. 3. Mild ventricular and sulcal prominence secondary to age-related volume loss. 4. Calcified atherosclerotic plaque within the cavernous/supraclinoid internal carotid arteries. Electronically signed by: Evalene Coho MD 05/22/2024 11:03 AM EST RP Workstation: [id number]   CT Cervical Spine Wo Contrast Result Date: 05/22/2024 CLINICAL DATA:  Neck trauma. Increased confusion and altered mental status. Multiple falls. EXAM: CT CERVICAL SPINE WITHOUT CONTRAST TECHNIQUE: Multidetector CT imaging of the cervical spine was performed without intravenous contrast. Multiplanar CT image reconstructions were also generated. RADIATION DOSE REDUCTION: This exam was performed according to the departmental dose-optimization program which includes automated exposure control, adjustment of the mA and/or kV according to patient size and/or use of iterative reconstruction technique. COMPARISON:  None Available. FINDINGS: Technical note: Despite efforts by the technologist and patient, moderate to  severe motion artifact is present on today's exam and could not be eliminated. This reduces exam sensitivity and specificity. Alignment: Straightening without focal angulation or significant listhesis. Skull base and vertebrae: No displaced cervical spine fracture or traumatic subluxation identified. Subtle osseous injuries could be obscured by the motion artifact. Soft tissues and spinal canal: No prevertebral fluid or swelling. No visible canal hematoma. Disc levels: Multilevel spondylosis with disc space narrowing, uncinate spurring and facet hypertrophy. Details are partially obscured by motion artifact. Upper chest: Clear lung apices. Other: Asymmetric degenerative changes at the left TMJ. IMPRESSION: 1. Moderate to severe motion artifact despite efforts by the technologist and patient. 2. No displaced cervical spine fracture or traumatic subluxation  identified. Subtle osseous injuries could be obscured by the motion artifact. If the patient has persistent neck pain or neurological symptoms, follow-up imaging recommended. 3. Multilevel cervical spondylosis. Electronically Signed   By: Elsie Perone M.D.   On: 05/22/2024 10:56   DG Chest Portable 1 View Result Date: 05/22/2024 EXAM: 1 VIEW(S) XRAY OF THE CHEST 05/22/2024 09:54:00 AM COMPARISON: 03/15/2024 CLINICAL HISTORY: fall FINDINGS: LUNGS AND PLEURA: No focal pulmonary opacity. No pleural effusion. No pneumothorax. HEART AND MEDIASTINUM: Aortic atherosclerosis. No acute abnormality of the cardiac and mediastinal silhouettes. BONES AND SOFT TISSUES: No acute osseous abnormality. IMPRESSION: 1. No acute process. 2. Aortic atherosclerosis. Electronically signed by: Evalene Coho MD 05/22/2024 10:14 AM EST RP Workstation: DARYLENE Alm Schneider, DO  [facility]  If 7PM-7AM, please contact night-coverage [email or web address] Password [email or web address] 05/27/2024, 4:24 PM   LOS: 3 days

## 2024-05-27 NOTE — Plan of Care (Signed)

## 2024-05-27 NOTE — Plan of Care (Signed)
   Problem: Education: Goal: Knowledge of General Education information will improve Description: Including pain rating scale, medication(s)/side effects and non-pharmacologic comfort measures Outcome: Progressing   Problem: Activity: Goal: Risk for activity intolerance will decrease Outcome: Progressing   Problem: Nutrition: Goal: Adequate nutrition will be maintained Outcome: Progressing

## 2024-05-28 DIAGNOSIS — N3091 Cystitis, unspecified with hematuria: Secondary | ICD-10-CM | POA: Diagnosis not present

## 2024-05-28 DIAGNOSIS — G9341 Metabolic encephalopathy: Secondary | ICD-10-CM | POA: Diagnosis not present

## 2024-05-28 DIAGNOSIS — F0390 Unspecified dementia without behavioral disturbance: Secondary | ICD-10-CM | POA: Diagnosis not present

## 2024-05-28 LAB — CULTURE, BLOOD (ROUTINE X 2)
Culture: NO GROWTH
Culture: NO GROWTH
Special Requests: ADEQUATE

## 2024-05-28 NOTE — Progress Notes (Signed)
 SLP Cancellation Note  Patient Details Name: Diana Smith MRN: [id number] DOB: [birth year]   Cancelled treatment:       Reason Eval/Treat Not Completed: SLP screened, no needs identified, will sign off;Other (comment) (SLP spoke with Pt and spouse and Pt is back to her baseline in terms of cognitive linguistic skills (baseline dementia). Pt has the appropriate level of assistance post discharge from acute (awaiting SNF approval).)  Thank you,  Lamar Candy, CCC-SLP [phone]  Nadeem Romanoski 05/28/2024, 4:16 PM

## 2024-05-28 NOTE — TOC Progression Note (Signed)
 Transition of Care [facility]) - Progression Note    Patient Details  Name: Diana Smith MRN: [id number] Date of Birth: 13-Sep-1945  [facility] CM/SW Contact  [place] Saddie Kim, [place] Phone Number: 05/28/2024, 1:54 PM  Clinical Narrative:  PASRR still pending. CMA attempted to start auth but SNF will have to start. SNF notified. TOC will follow.                        Expected Discharge Plan and Services                                               Social Drivers of Health (SDOH) Interventions SDOH Screenings   Food Insecurity: No Food Insecurity (05/23/2024)  Housing: Low Risk  (05/23/2024)  Transportation Needs: No Transportation Needs (05/23/2024)  Utilities: Not At Risk (05/23/2024)  Social Connections: Socially Integrated (05/23/2024)  Tobacco Use: Low Risk  (05/23/2024)    Readmission Risk Interventions     No data to display

## 2024-05-28 NOTE — Plan of Care (Signed)
   Problem: Activity: Goal: Risk for activity intolerance will decrease Outcome: Progressing   Problem: Coping: Goal: Level of anxiety will decrease Outcome: Progressing

## 2024-05-28 NOTE — Progress Notes (Signed)
 PROGRESS NOTE  Diana Smith FMW:[id number] DOB: 08/30/1945 DOA: 05/23/2024 PCP: Kip Righter, MD  Brief History:  78 y.o. female with medical history significant for dementia, hypertension, hyperlipidemia stroke, NASH, depression, RBBB presented with altered mental status.  The patient was in the ED on 05/22/2024 because of falls and confusion.  The patient had an unwitnessed fall in the middle of the night 11/24-11/25.  Spouse had difficulty getting her up off the floor.  EMS was activated and the patient was brought to the emergency department.  Labs including CMP, CBC were unremarkable with a TSH 4.420.  Ammonia was 18.  UA was negative for pyuria.  CT of the brain was negative for acute findings.  CT cervical spine was negative for fracture or dislocation or subluxation.  The patient was discharged home in stable condition after being given Ativan  IV for agitation.. After pt returned home, husband gave patient's 2 more doses of Xanax   0.25 mg. On the morning of 05/23/2024, spouse had difficulty waking the patient up due to somnolence.  At baseline, the patient has cognitive impairment..  She is able to answer simple questions and speak fluently.  She is pleasantly confused.  She is able to ambulate without assistive devices.  She was able to clothe herself.  Patient has had decreased oral intake for the past 2 days.  Spouse states that the patient has not had any recent complaints.  Spouse states that the patient's cognitive impairment has been fairly stable for the last 6 months.  There is not been any new medications or changes in her medications.  No reports of fevers, chills, chest pain, shortness breath, cough, hemoptysis, vomiting, diarrhea, abdominal pain.  In the ED, the patient had low-grade temperature 100.1 F.  She was hemodynamically stable with oxygen saturation 95% room air.  WBC 14.6, hemoglobin 12.7, platelets 191.  Sodium 140, potassium 3.5, bicarbonate 23, serum  creatinine 0.82.  UA 21-50WBC She was started on IV fluids.   Assessment/Plan:  Acute metabolic encephalopathy -Ammonia 19 - B12--832 - Folic acid --10.2 - TSH 4.420 - 05/22/2024 CT brain negative for acute findings - 05/22/2024 CT cervical spine negative for acute fracture or subluxation - 05/24/24--remains confused and agitated - 05/25/24--more awake and alert, pleasantly confused - 05/26/24--back to baseline per spouse at bedside   UTI - UA 21-50 WBC - continue merrem  - follow urine culture--multiple organisms   Dehydration - Continue IV fluids>>saline lock   Mixed hyperlipidemia - Continue statin   Major neurocognitive disorder - Continue Namenda  - Continue Lexapro  - Holding Seroquel  temporarily   Essential hypertension - Previously took valsartan  - Monitor off BP meds as patient has had orthostatic hypotension   Abdominal pain -CT abd/pelvis--no hydronephrosis or perinephric stranding; Inflammatory changes in the perirectal fat with the rectum mild to moderately distended with stool, possible proctitis -11/28--abd pain improved, tolerating diet -4 BMs after bisacodyl  and miralax  - abdominal pain resolved                 Family Communication:   husband at bedside 12/1   Consultants:  none   Code Status:  DNR   DVT Prophylaxis:   [place] Lovenox      Procedures: As Listed in Progress Note Above   Antibiotics: merrem  11/27>>12/2       Subjective: Patient denies fevers, chills, headache, chest pain, dyspnea, nausea, vomiting, diarrhea, abdominal pain   Objective: Vitals:   05/27/24 0500 05/27/24 1942 05/28/24 0511  05/28/24 1358  BP: (!) 186/64 (!) 155/69 (!) (P) 145/64 (!) 149/77  Pulse: 64 69 (P) 60 83  Resp: 18 18 (P) 18 16  Temp: 98.6 F (37 C) 98 F (36.7 C) (P) 98.6 F (37 C) 99.2 F (37.3 C)  TempSrc:  Oral (P) Oral Oral  SpO2: 94% 98% (P) 96% 96%  Weight:      Height:        Intake/Output Summary (Last 24 hours) at 05/28/2024  1830 Last data filed at 05/28/2024 1744 Gross per 24 hour  Intake 600 ml  Output --  Net 600 ml   Weight change:  Exam:  General:  Pt is alert, follows commands appropriately, not in acute distress HEENT: No icterus, No thrush, No neck mass, [place]/AT Cardiovascular: RRR, S1/S2, no rubs, no gallops Respiratory: CTA bilaterally, no wheezing, no crackles, no rhonchi Abdomen: Soft/+BS, non tender, non distended, no guarding Extremities: No edema, No lymphangitis, No petechiae, No rashes, no synovitis   Data Reviewed: I have personally reviewed following labs and imaging studies Basic Metabolic Panel: Recent Labs  Lab 05/22/24 0946 05/23/24 1518 05/24/24 0426 05/25/24 0438  NA 143 140 142 141  K 4.2 3.5 3.3* 3.9  CL 109 107 110 110  CO2 21* 23 22 20*  GLUCOSE 101* 133* 119* 114*  BUN 12 16 15 13   CREATININE 0.93 0.82 0.68 0.65  CALCIUM  10.0 9.6 9.0 8.8*  MG  --  2.3  --  2.2   Liver Function Tests: Recent Labs  Lab 05/22/24 0946 05/23/24 1518  AST 26 20  ALT 14 11  ALKPHOS 116 109  BILITOT 0.7 1.0  PROT 7.3 7.2  ALBUMIN 4.5 4.2   Recent Labs  Lab 05/22/24 0946  LIPASE 23   Recent Labs  Lab 05/22/24 0946 05/23/24 1518  AMMONIA 18 19   Coagulation Profile: No results for input(s): INR, PROTIME in the last 168 hours. CBC: Recent Labs  Lab 05/22/24 0946 05/23/24 1518 05/24/24 0426 05/25/24 0438  WBC 10.4 14.6* 13.7* 12.8*  NEUTROABS 7.6 12.2*  --   --   HGB 13.5 12.7 11.1* 10.7*  HCT 39.2 37.8 33.3* 31.2*  MCV 95.8 98.7 98.2 96.9  PLT 255 191 177 184   Cardiac Enzymes: No results for input(s): CKTOTAL, CKMB, CKMBINDEX, TROPONINI in the last 168 hours. BNP: Invalid input(s): POCBNP CBG: No results for input(s): GLUCAP in the last 168 hours. HbA1C: No results for input(s): HGBA1C in the last 72 hours. Urine analysis:    Component Value Date/Time   COLORURINE YELLOW 05/24/2024 0504   APPEARANCEUR TURBID (A) 05/24/2024 0504    LABSPEC 1.029 05/24/2024 0504   PHURINE 5.0 05/24/2024 0504   GLUCOSEU NEGATIVE 05/24/2024 0504   HGBUR MODERATE (A) 05/24/2024 0504   BILIRUBINUR NEGATIVE 05/24/2024 0504   BILIRUBINUR small (A) 04/23/2024 1706   KETONESUR 5 (A) 05/24/2024 0504   PROTEINUR 100 (A) 05/24/2024 0504   UROBILINOGEN 0.2 04/23/2024 1706   NITRITE NEGATIVE 05/24/2024 0504   LEUKOCYTESUR MODERATE (A) 05/24/2024 0504   Sepsis Labs: @LABRCNTIP (procalcitonin:4,lacticidven:4) ) Recent Results (from the past 240 hours)  Resp panel by RT-PCR (RSV, Flu A&B, Covid) Anterior Nasal Swab     Status: None   Collection Time: 05/22/24  9:46 AM   Specimen: Anterior Nasal Swab  Result Value Ref Range Status   SARS Coronavirus 2 by RT PCR NEGATIVE NEGATIVE Final    Comment: (NOTE) SARS-CoV-2 target nucleic acids are NOT DETECTED.  The SARS-CoV-2 RNA is generally  detectable in upper respiratory specimens during the acute phase of infection. The lowest concentration of SARS-CoV-2 viral copies this assay can detect is 138 copies/mL. A negative result does not preclude SARS-Cov-2 infection and should not be used as the sole basis for treatment or other patient management decisions. A negative result may occur with  improper specimen collection/handling, submission of specimen other than nasopharyngeal swab, presence of viral mutation(s) within the areas targeted by this assay, and inadequate number of viral copies(<138 copies/mL). A negative result must be combined with clinical observations, patient history, and epidemiological information. The expected result is Negative.  Fact Sheet for Patients:  bloggercourse.com  Fact Sheet for Healthcare Providers:  seriousbroker.it  This test is no t yet approved or cleared by the [place]  FDA and  has been authorized for detection and/or diagnosis of SARS-CoV-2 by FDA under an Emergency Use Authorization (EUA). This EUA  will remain  in effect (meaning this test can be used) for the duration of the COVID-19 declaration under Section 564(b)(1) of the Act, 21 U.S.C.section 360bbb-3(b)(1), unless the authorization is terminated  or revoked sooner.       Influenza A by PCR NEGATIVE NEGATIVE Final   Influenza B by PCR NEGATIVE NEGATIVE Final    Comment: (NOTE) The Xpert Xpress SARS-CoV-2/FLU/RSV plus assay is intended as an aid in the diagnosis of influenza from Nasopharyngeal swab specimens and should not be used as a sole basis for treatment. Nasal washings and aspirates are unacceptable for Xpert Xpress SARS-CoV-2/FLU/RSV testing.  Fact Sheet for Patients: bloggercourse.com  Fact Sheet for Healthcare Providers: seriousbroker.it  This test is not yet approved or cleared by the [place]  FDA and has been authorized for detection and/or diagnosis of SARS-CoV-2 by FDA under an Emergency Use Authorization (EUA). This EUA will remain in effect (meaning this test can be used) for the duration of the COVID-19 declaration under Section 564(b)(1) of the Act, 21 U.S.C. section 360bbb-3(b)(1), unless the authorization is terminated or revoked.     Resp Syncytial Virus by PCR NEGATIVE NEGATIVE Final    Comment: (NOTE) Fact Sheet for Patients: bloggercourse.com  Fact Sheet for Healthcare Providers: seriousbroker.it  This test is not yet approved or cleared by the [place]  FDA and has been authorized for detection and/or diagnosis of SARS-CoV-2 by FDA under an Emergency Use Authorization (EUA). This EUA will remain in effect (meaning this test can be used) for the duration of the COVID-19 declaration under Section 564(b)(1) of the Act, 21 U.S.C. section 360bbb-3(b)(1), unless the authorization is terminated or revoked.  Performed at [facility], [address] [place], [place] [address]   Blood  culture (routine x 2)     Status: None   Collection Time: 05/23/24  5:44 PM   Specimen: BLOOD  Result Value Ref Range Status   Specimen Description BLOOD BLOOD RIGHT HAND  Final   Special Requests   Final    BOTTLES DRAWN AEROBIC ONLY Blood Culture results may not be optimal due to an inadequate volume of blood received in culture bottles   Culture   Final    NO GROWTH 5 DAYS Performed at [facility], [address], [place], [place] [address]    Report Status 05/28/2024 FINAL  Final  Blood culture (routine x 2)     Status: None   Collection Time: 05/23/24  5:51 PM   Specimen: BLOOD  Result Value Ref Range Status   Specimen Description BLOOD RIGHT ANTECUBITAL  Final   Special Requests  Final    BOTTLES DRAWN AEROBIC AND ANAEROBIC Blood Culture adequate volume   Culture   Final    NO GROWTH 5 DAYS Performed at [facility], [address], [place], [place] [address]    Report Status 05/28/2024 FINAL  Final  Urine Culture     Status: Abnormal   Collection Time: 05/24/24  5:04 AM   Specimen: Urine, Clean Catch  Result Value Ref Range Status   Specimen Description   Final    URINE, CLEAN CATCH Performed at [facility], [address], [place], [place] [address]    Special Requests   Final    NONE Performed at [facility], [address] [place], [place] [address]    Culture MULTIPLE SPECIES PRESENT, SUGGEST RECOLLECTION (A)  Final   Report Status 05/25/2024 FINAL  Final     Scheduled Meds:  aspirin  EC  81 mg Oral Daily   enoxaparin  (LOVENOX ) injection  40 mg Subcutaneous Q24H   escitalopram   10 mg Oral Daily   Gerhardt's butt cream   Topical BID   memantine   10 mg Oral QHS   polyethylene glycol  17 g Oral Daily   rosuvastatin   20 mg Oral Daily   Continuous Infusions:  meropenem  (MERREM ) IV Stopped (05/28/24 0832)    Procedures/Studies: CT ABDOMEN PELVIS W CONTRAST Result Date: 05/24/2024 CLINICAL DATA:  Recurrent UTI.  Abdominal pain.  Leukocytosis. EXAM: CT  ABDOMEN AND PELVIS WITH CONTRAST TECHNIQUE: Multidetector CT imaging of the abdomen and pelvis was performed using the standard protocol following bolus administration of intravenous contrast. RADIATION DOSE REDUCTION: This exam was performed according to the departmental dose-optimization program which includes automated exposure control, adjustment of the mA and/or kV according to patient size and/or use of iterative reconstruction technique. CONTRAST:  OMNIPAQUE  IOHEXOL  300 MG/ML  SOLN COMPARISON:  04/16/2022. FINDINGS: Study mildly degraded by patient motion. Lower chest: Mild subsegmental atelectasis.  No acute findings. Hepatobiliary: No focal liver abnormality is seen. No gallstones, gallbladder wall thickening, or biliary dilatation. Pancreas: Unremarkable. No pancreatic ductal dilatation or surrounding inflammatory changes. Spleen: Normal in size without focal abnormality. Adrenals/Urinary Tract: Normal adrenal glands. Kidneys normal in size, orientation and position with symmetric enhancement and excretion. Two left renal cysts, exophytic cyst from the upper pole, 2.3 cm, and larger cortical cyst from the lower pole, 4.7 cm, both stable. No renal stones. No hydronephrosis. Ureters are normal in course and in caliber. Bladder mild to moderately distended. No bladder wall thickening, mass or stone. Stomach/Bowel: Rectum mild to moderately distended with stool. There is perirectal fat stranding, haziness and fluid attenuation. No rectal wall thickening. Stomach is unremarkable. Small bowel and colon are normal in caliber. No wall thickening or inflammation. Vascular/Lymphatic: Aortic atherosclerosis. No enlarged abdominal or pelvic lymph nodes. Reproductive: Status post hysterectomy. No adnexal masses. Other: Trace amount of presacral pelvic free fluid. Musculoskeletal: No fracture or acute finding.  No bone lesion. IMPRESSION: 1. No CT evidence of pyelonephritis or cystitis. 2. Inflammatory changes in  the perirectal fat with the rectum mild to moderately distended with stool. Consider proctitis in the proper clinical setting. There is also a trace amount of presacral pelvic free fluid. 3. No other evidence of an acute abnormality within the abdomen or pelvis. Electronically Signed   By: Alm Parkins M.D.   On: 05/24/2024 13:44   DG Chest Port 1 View Result Date: 05/23/2024 CLINICAL DATA:  Concern for aspiration. EXAM: PORTABLE CHEST 1 VIEW COMPARISON:  Chest radiograph dated 05/22/2024.  FINDINGS: No focal consolidation, pleural effusion or pneumothorax. The cardiac silhouette. No acute osseous pathology. IMPRESSION: No active disease. Electronically Signed   By: Vanetta Chou M.D.   On: 05/23/2024 17:19   DG Hip Unilat W or Wo Pelvis 2-3 Views Right Result Date: 05/23/2024 EXAM: 2 OR MORE VIEW(S) XRAY OF THE RIGHT HIP 05/23/2024 02:18:00 PM COMPARISON: None available. CLINICAL HISTORY: Right lower extremity pain and dementia. FINDINGS: BONES AND JOINTS: The patient is rotated to the left on today's radiographs. No external rotation view is provided. We have adducted and mildly abducted views of the right hip. Presumably standard positioning was problematic due to compliance issues. No fracture or acute bony findings identified. Minimal spurring of the right femoral head. The hip joint is maintained. SOFT TISSUES: The soft tissues are unremarkable. IMPRESSION: 1. No acute bony findings in the right hip. 2. Minimal degenerative spurring of the right femoral head. Electronically signed by: Ryan Salvage MD 05/23/2024 03:46 PM EST RP Workstation: [id number]   CT Head Wo Contrast Result Date: 05/22/2024 EXAM: CT HEAD WITHOUT 05/22/2024 10:49:54 AM TECHNIQUE: CT of the head was performed without the administration of intravenous contrast. Automated exposure control, iterative reconstruction, and/or weight based adjustment of the mA/kV was utilized to reduce the radiation dose to as low as reasonably  achievable. COMPARISON: 03/10/2024 CLINICAL HISTORY: Head trauma, minor (Age >= 65y) FINDINGS: BRAIN AND VENTRICLES: No acute intracranial hemorrhage. No mass effect or midline shift. No extra-axial fluid collection. No evidence of acute infarct. No hydrocephalus. Patchy periventricular and subcortical white matter hypoattenuation, likely representing chronic small vessel ischemic change. Mild ventricular and sulcal prominence secondary to age-related volume loss. Calcified atherosclerotic plaque within the cavernous/supraclinoid internal carotid arteries. ORBITS: No acute abnormality. SINUSES AND MASTOIDS: No acute abnormality. SOFT TISSUES AND SKULL: No acute skull fracture. No acute soft tissue abnormality. IMPRESSION: 1. No acute intracranial abnormality related to the head trauma. 2. Patchy periventricular and subcortical white matter hypoattenuation, likely representing chronic small vessel ischemic change. 3. Mild ventricular and sulcal prominence secondary to age-related volume loss. 4. Calcified atherosclerotic plaque within the cavernous/supraclinoid internal carotid arteries. Electronically signed by: Evalene Coho MD 05/22/2024 11:03 AM EST RP Workstation: [id number]   CT Cervical Spine Wo Contrast Result Date: 05/22/2024 CLINICAL DATA:  Neck trauma. Increased confusion and altered mental status. Multiple falls. EXAM: CT CERVICAL SPINE WITHOUT CONTRAST TECHNIQUE: Multidetector CT imaging of the cervical spine was performed without intravenous contrast. Multiplanar CT image reconstructions were also generated. RADIATION DOSE REDUCTION: This exam was performed according to the departmental dose-optimization program which includes automated exposure control, adjustment of the mA and/or kV according to patient size and/or use of iterative reconstruction technique. COMPARISON:  None Available. FINDINGS: Technical note: Despite efforts by the technologist and patient, moderate to severe motion artifact  is present on today's exam and could not be eliminated. This reduces exam sensitivity and specificity. Alignment: Straightening without focal angulation or significant listhesis. Skull base and vertebrae: No displaced cervical spine fracture or traumatic subluxation identified. Subtle osseous injuries could be obscured by the motion artifact. Soft tissues and spinal canal: No prevertebral fluid or swelling. No visible canal hematoma. Disc levels: Multilevel spondylosis with disc space narrowing, uncinate spurring and facet hypertrophy. Details are partially obscured by motion artifact. Upper chest: Clear lung apices. Other: Asymmetric degenerative changes at the left TMJ. IMPRESSION: 1. Moderate to severe motion artifact despite efforts by the technologist and patient. 2. No displaced cervical spine fracture or traumatic subluxation identified. Subtle osseous injuries  could be obscured by the motion artifact. If the patient has persistent neck pain or neurological symptoms, follow-up imaging recommended. 3. Multilevel cervical spondylosis. Electronically Signed   By: Elsie Perone M.D.   On: 05/22/2024 10:56   DG Chest Portable 1 View Result Date: 05/22/2024 EXAM: 1 VIEW(S) XRAY OF THE CHEST 05/22/2024 09:54:00 AM COMPARISON: 03/15/2024 CLINICAL HISTORY: fall FINDINGS: LUNGS AND PLEURA: No focal pulmonary opacity. No pleural effusion. No pneumothorax. HEART AND MEDIASTINUM: Aortic atherosclerosis. No acute abnormality of the cardiac and mediastinal silhouettes. BONES AND SOFT TISSUES: No acute osseous abnormality. IMPRESSION: 1. No acute process. 2. Aortic atherosclerosis. Electronically signed by: Evalene Coho MD 05/22/2024 10:14 AM EST RP Workstation: [email or web address] Alm Schneider, DO  [facility]  If 7PM-7AM, please contact night-coverage [email or web address] Password [email or web address] 05/28/2024, 6:30 PM   LOS: 4 days

## 2024-05-28 NOTE — Plan of Care (Signed)

## 2024-05-28 NOTE — Progress Notes (Signed)
 Mobility Specialist Progress Note:    05/28/24 1535  Mobility  Activity Pivoted/transferred from bed to chair  Level of Assistance Moderate assist, patient does 50-74% (+2)  Assistive Device None  Distance Ambulated (ft) 3 ft  Range of Motion/Exercises Active;All extremities  Activity Response Tolerated well  Mobility Referral Yes  Mobility visit 1 Mobility  Mobility Specialist Start Time (ACUTE ONLY) 1535  Mobility Specialist Stop Time (ACUTE ONLY) 1555  Mobility Specialist Time Calculation (min) (ACUTE ONLY) 20 min   Pt received in bed, agreeable to mobility. Required ModA +2 to stand and transfer with no AD. Tolerated well, required verbal cues during transfer d/t dementia. RN in room, all needs met.  Catricia Scheerer Mobility Specialist Please contact via Special Educational Needs Teacher or  Rehab office at [phone]

## 2024-05-28 NOTE — Progress Notes (Signed)
 Mobility Specialist Progress Note:    05/28/24 1145  Mobility  Activity Pivoted/transferred from bed to chair  Level of Assistance Moderate assist, patient does 50-74% (+2)  Assistive Device None  Distance Ambulated (ft) 3 ft  Range of Motion/Exercises Active;All extremities  Activity Response Tolerated well  Mobility Referral Yes  Mobility visit 1 Mobility  Mobility Specialist Start Time (ACUTE ONLY) 1145  Mobility Specialist Stop Time (ACUTE ONLY) 1205  Mobility Specialist Time Calculation (min) (ACUTE ONLY) 20 min   Pt received in bed, RN and husband requesting pt sit up in chair. Required ModA+2 to stand and transfer with no AD. Tolerated well, required verbal cues during session d/t dementia. RN and husband in room, all needs met.  Giuseppe Duchemin Mobility Specialist Please contact via Special Educational Needs Teacher or  Rehab office at [phone]

## 2024-05-29 MED ORDER — MELATONIN 12 MG PO TABS: 5.0000 mg | ORAL_TABLET | Freq: Every day | ORAL | Status: DC

## 2024-05-29 MED ORDER — ORAL CARE MOUTH RINSE: 15.0000 mL | OROMUCOSAL | Status: DC | PRN

## 2024-05-29 MED ORDER — GERHARDT'S BUTT CREAM: 1.0000 | TOPICAL_CREAM | Freq: Two times a day (BID) | CUTANEOUS | Status: DC

## 2024-05-29 NOTE — TOC Transition Note (Signed)
 Transition of Care [facility]) - Discharge Note   Patient Details  Name: Diana Smith MRN: [id number] Date of Birth: 21-Nov-1945  [facility] CM/SW Contact:  Mcarthur Saddie Kim, LCSW Phone Number: 05/29/2024, 11:21 AM   Clinical Narrative: Pt d/c today to [facility]. Pt's husband and facility aware and agreeable. PASRR received. Authorization received. Pt will transfer with staff. D/C summary sent to SNF. RN given number to call report.       Final next level of care: Skilled Nursing Facility Barriers to Discharge: Barriers Resolved   Patient Goals and CMS Choice            Discharge Placement PASRR number recieved: 05/29/24            Patient chooses bed at: [facility] Patient to be transferred to facility by: staff Name of family member notified: husband Patient and family notified of of transfer: 05/29/24  Discharge Plan and Services Additional resources added to the After Visit Summary for                                       Social Drivers of Health (SDOH) Interventions SDOH Screenings   Food Insecurity: No Food Insecurity (05/23/2024)  Housing: Low Risk  (05/23/2024)  Transportation Needs: No Transportation Needs (05/23/2024)  Utilities: Not At Risk (05/23/2024)  Social Connections: Socially Integrated (05/23/2024)  Tobacco Use: Low Risk  (05/23/2024)     Readmission Risk Interventions     No data to display

## 2024-05-29 NOTE — Discharge Summary (Signed)
 Physician Discharge Summary   Patient: Diana Smith MRN: [id number] DOB: 1946-06-04  Admit date:     05/23/2024  Discharge date: 05/29/24  Discharge Physician: Alm Alando Colleran   PCP: Kip Righter, MD   Recommendations at discharge:   Please follow up with primary care provider within 1-2 weeks  Please repeat BMP and CBC in one week    Hospital Course: 78 y.o. female with medical history significant for dementia, hypertension, hyperlipidemia stroke, NASH, depression, RBBB presented with altered mental status.  The patient was in the ED on 05/22/2024 because of falls and confusion.  The patient had an unwitnessed fall in the middle of the night 11/24-11/25.  Spouse had difficulty getting her up off the floor.  EMS was activated and the patient was brought to the emergency department.  Labs including CMP, CBC were unremarkable with a TSH 4.420.  Ammonia was 18.  UA was negative for pyuria.  CT of the brain was negative for acute findings.  CT cervical spine was negative for fracture or dislocation or subluxation.  The patient was discharged home in stable condition after being given Ativan  IV for agitation.. After pt returned home, husband gave patient's 2 more doses of Xanax   0.25 mg. On the morning of 05/23/2024, spouse had difficulty waking the patient up due to somnolence.  At baseline, the patient has cognitive impairment..  She is able to answer simple questions and speak fluently.  She is pleasantly confused.  She is able to ambulate without assistive devices.  She was able to clothe herself.  Patient has had decreased oral intake for the past 2 days.  Spouse states that the patient has not had any recent complaints.  Spouse states that the patient's cognitive impairment has been fairly stable for the last 6 months.  There is not been any new medications or changes in her medications.  No reports of fevers, chills, chest pain, shortness breath, cough, hemoptysis, vomiting, diarrhea, abdominal  pain.  In the ED, the patient had low-grade temperature 100.1 F.  She was hemodynamically stable with oxygen saturation 95% room air.  WBC 14.6, hemoglobin 12.7, platelets 191.  Sodium 140, potassium 3.5, bicarbonate 23, serum creatinine 0.82.  UA 21-50WBC She was started on IV fluids.  Assessment and Plan: Acute metabolic encephalopathy -Ammonia 19 - B12--832 - Folic acid --10.2 - TSH 4.420 - 05/22/2024 CT brain negative for acute findings - 05/22/2024 CT cervical spine negative for acute fracture or subluxation - 05/24/24--remains confused and agitated - 05/25/24--more awake and alert, pleasantly confused - 05/26/24--back to baseline per spouse at bedside   UTI - UA 21-50 WBC - continue merrem >>finished 5 days - follow urine culture--multiple organisms   Dehydration - Continue IV fluids>>saline lock   Mixed hyperlipidemia - Continue statin   Major neurocognitive disorder - Continue Namenda  - Continue Lexapro  - Holding Seroquel  temporarily   Essential hypertension - Previously took valsartan  - Monitor off BP meds as patient has had orthostatic hypotension   Abdominal pain -CT abd/pelvis--no hydronephrosis or perinephric stranding; Inflammatory changes in the perirectal fat with the rectum mild to moderately distended with stool, possible proctitis -11/28--abd pain improved, tolerating diet -4 BMs after bisacodyl  and miralax  - abdominal pain resolved          Consultants: none Procedures performed: none  Disposition: Skilled nursing facility Diet recommendation:  regular DISCHARGE MEDICATION: Allergies as of 05/29/2024       Reactions   Ativan  [lorazepam ] Other (See Comments)   Made her out of it,  made her memory worse for an extended time period after taking   Gatifloxacin Other (See Comments)   Unknown    Hydrocodone Other (See Comments)   Unknown    Keflex  [cephalexin ] Other (See Comments)   Unknown    Lorabid [loracarbef] Other (See Comments)    Unknown    Minocycline Other (See Comments)   Unknown         Medication List     STOP taking these medications    ALPRAZolam  0.25 MG tablet Commonly known as: XANAX    midodrine  10 MG tablet Commonly known as: PROAMATINE    QUEtiapine  25 MG tablet Commonly known as: SEROQUEL        TAKE these medications    aspirin  EC 81 MG tablet Take 1 tablet (81 mg total) by mouth daily. Swallow whole.   calcium  carbonate 1500 (600 Ca) MG Tabs tablet Commonly known as: OSCAL Take 600 mg of elemental calcium  by mouth in the morning and at bedtime.   escitalopram  10 MG tablet Commonly known as: LEXAPRO  Take 10 mg by mouth daily.   estradiol 0.1 MG/GM vaginal cream Commonly known as: ESTRACE Place 1 Applicatorful vaginally daily. Every day for 14 days, then twice a week.   Gerhardt's butt cream Crea Apply 1 Application topically 2 (two) times daily.   Melatonin 12 MG Tabs Take 5 mg by mouth at bedtime. What changed: how much to take   memantine  10 MG tablet Commonly known as: NAMENDA  Take 10 mg by mouth at bedtime.   nystatin  cream Commonly known as: MYCOSTATIN  Apply 1 Application topically 2 (two) times daily as needed (yeast infection). Apply to stomach and private parts   rosuvastatin  20 MG tablet Commonly known as: Crestor  Take 1 tablet (20 mg total) by mouth daily.        Contact information for after-discharge care     Destination     [facility] .   Service: Skilled Nursing Contact information: [address] [place]  [address] [phone]                    Discharge Exam: Fredricka Weights   05/23/24 1230 05/23/24 1834  Weight: 77.1 kg 73.9 kg   HEENT:  [place]/AT, No thrush, no icterus CV:  RRR, no rub, no S3, no S4 Lung:  CTA, no wheeze, no rhonchi Abd:  soft/+BS, NT Ext:  No edema, no lymphangitis, no synovitis, no rash   Condition at discharge: stable  The results of significant diagnostics from this  hospitalization (including imaging, microbiology, ancillary and laboratory) are listed below for reference.   Imaging Studies: CT ABDOMEN PELVIS W CONTRAST Result Date: 05/24/2024 CLINICAL DATA:  Recurrent UTI.  Abdominal pain.  Leukocytosis. EXAM: CT ABDOMEN AND PELVIS WITH CONTRAST TECHNIQUE: Multidetector CT imaging of the abdomen and pelvis was performed using the standard protocol following bolus administration of intravenous contrast. RADIATION DOSE REDUCTION: This exam was performed according to the departmental dose-optimization program which includes automated exposure control, adjustment of the mA and/or kV according to patient size and/or use of iterative reconstruction technique. CONTRAST:  OMNIPAQUE  IOHEXOL  300 MG/ML  SOLN COMPARISON:  04/16/2022. FINDINGS: Study mildly degraded by patient motion. Lower chest: Mild subsegmental atelectasis.  No acute findings. Hepatobiliary: No focal liver abnormality is seen. No gallstones, gallbladder wall thickening, or biliary dilatation. Pancreas: Unremarkable. No pancreatic ductal dilatation or surrounding inflammatory changes. Spleen: Normal in size without focal abnormality. Adrenals/Urinary Tract: Normal adrenal glands. Kidneys normal in size, orientation  and position with symmetric enhancement and excretion. Two left renal cysts, exophytic cyst from the upper pole, 2.3 cm, and larger cortical cyst from the lower pole, 4.7 cm, both stable. No renal stones. No hydronephrosis. Ureters are normal in course and in caliber. Bladder mild to moderately distended. No bladder wall thickening, mass or stone. Stomach/Bowel: Rectum mild to moderately distended with stool. There is perirectal fat stranding, haziness and fluid attenuation. No rectal wall thickening. Stomach is unremarkable. Small bowel and colon are normal in caliber. No wall thickening or inflammation. Vascular/Lymphatic: Aortic atherosclerosis. No enlarged abdominal or pelvic lymph nodes.  Reproductive: Status post hysterectomy. No adnexal masses. Other: Trace amount of presacral pelvic free fluid. Musculoskeletal: No fracture or acute finding.  No bone lesion. IMPRESSION: 1. No CT evidence of pyelonephritis or cystitis. 2. Inflammatory changes in the perirectal fat with the rectum mild to moderately distended with stool. Consider proctitis in the proper clinical setting. There is also a trace amount of presacral pelvic free fluid. 3. No other evidence of an acute abnormality within the abdomen or pelvis. Electronically Signed   By: Alm Parkins M.D.   On: 05/24/2024 13:44   DG Chest Port 1 View Result Date: 05/23/2024 CLINICAL DATA:  Concern for aspiration. EXAM: PORTABLE CHEST 1 VIEW COMPARISON:  Chest radiograph dated 05/22/2024. FINDINGS: No focal consolidation, pleural effusion or pneumothorax. The cardiac silhouette. No acute osseous pathology. IMPRESSION: No active disease. Electronically Signed   By: Vanetta Chou M.D.   On: 05/23/2024 17:19   DG Hip Unilat W or Wo Pelvis 2-3 Views Right Result Date: 05/23/2024 EXAM: 2 OR MORE VIEW(S) XRAY OF THE RIGHT HIP 05/23/2024 02:18:00 PM COMPARISON: None available. CLINICAL HISTORY: Right lower extremity pain and dementia. FINDINGS: BONES AND JOINTS: The patient is rotated to the left on today's radiographs. No external rotation view is provided. We have adducted and mildly abducted views of the right hip. Presumably standard positioning was problematic due to compliance issues. No fracture or acute bony findings identified. Minimal spurring of the right femoral head. The hip joint is maintained. SOFT TISSUES: The soft tissues are unremarkable. IMPRESSION: 1. No acute bony findings in the right hip. 2. Minimal degenerative spurring of the right femoral head. Electronically signed by: Ryan Salvage MD 05/23/2024 03:46 PM EST RP Workstation: [id number]   CT Head Wo Contrast Result Date: 05/22/2024 EXAM: CT HEAD WITHOUT 05/22/2024  10:49:54 AM TECHNIQUE: CT of the head was performed without the administration of intravenous contrast. Automated exposure control, iterative reconstruction, and/or weight based adjustment of the mA/kV was utilized to reduce the radiation dose to as low as reasonably achievable. COMPARISON: 03/10/2024 CLINICAL HISTORY: Head trauma, minor (Age >= 65y) FINDINGS: BRAIN AND VENTRICLES: No acute intracranial hemorrhage. No mass effect or midline shift. No extra-axial fluid collection. No evidence of acute infarct. No hydrocephalus. Patchy periventricular and subcortical white matter hypoattenuation, likely representing chronic small vessel ischemic change. Mild ventricular and sulcal prominence secondary to age-related volume loss. Calcified atherosclerotic plaque within the cavernous/supraclinoid internal carotid arteries. ORBITS: No acute abnormality. SINUSES AND MASTOIDS: No acute abnormality. SOFT TISSUES AND SKULL: No acute skull fracture. No acute soft tissue abnormality. IMPRESSION: 1. No acute intracranial abnormality related to the head trauma. 2. Patchy periventricular and subcortical white matter hypoattenuation, likely representing chronic small vessel ischemic change. 3. Mild ventricular and sulcal prominence secondary to age-related volume loss. 4. Calcified atherosclerotic plaque within the cavernous/supraclinoid internal carotid arteries. Electronically signed by: Evalene Coho MD 05/22/2024 11:03 AM EST RP  Workstation: [id number]   CT Cervical Spine Wo Contrast Result Date: 05/22/2024 CLINICAL DATA:  Neck trauma. Increased confusion and altered mental status. Multiple falls. EXAM: CT CERVICAL SPINE WITHOUT CONTRAST TECHNIQUE: Multidetector CT imaging of the cervical spine was performed without intravenous contrast. Multiplanar CT image reconstructions were also generated. RADIATION DOSE REDUCTION: This exam was performed according to the departmental dose-optimization program which includes  automated exposure control, adjustment of the mA and/or kV according to patient size and/or use of iterative reconstruction technique. COMPARISON:  None Available. FINDINGS: Technical note: Despite efforts by the technologist and patient, moderate to severe motion artifact is present on today's exam and could not be eliminated. This reduces exam sensitivity and specificity. Alignment: Straightening without focal angulation or significant listhesis. Skull base and vertebrae: No displaced cervical spine fracture or traumatic subluxation identified. Subtle osseous injuries could be obscured by the motion artifact. Soft tissues and spinal canal: No prevertebral fluid or swelling. No visible canal hematoma. Disc levels: Multilevel spondylosis with disc space narrowing, uncinate spurring and facet hypertrophy. Details are partially obscured by motion artifact. Upper chest: Clear lung apices. Other: Asymmetric degenerative changes at the left TMJ. IMPRESSION: 1. Moderate to severe motion artifact despite efforts by the technologist and patient. 2. No displaced cervical spine fracture or traumatic subluxation identified. Subtle osseous injuries could be obscured by the motion artifact. If the patient has persistent neck pain or neurological symptoms, follow-up imaging recommended. 3. Multilevel cervical spondylosis. Electronically Signed   By: Elsie Perone M.D.   On: 05/22/2024 10:56   DG Chest Portable 1 View Result Date: 05/22/2024 EXAM: 1 VIEW(S) XRAY OF THE CHEST 05/22/2024 09:54:00 AM COMPARISON: 03/15/2024 CLINICAL HISTORY: fall FINDINGS: LUNGS AND PLEURA: No focal pulmonary opacity. No pleural effusion. No pneumothorax. HEART AND MEDIASTINUM: Aortic atherosclerosis. No acute abnormality of the cardiac and mediastinal silhouettes. BONES AND SOFT TISSUES: No acute osseous abnormality. IMPRESSION: 1. No acute process. 2. Aortic atherosclerosis. Electronically signed by: Evalene Coho MD 05/22/2024 10:14 AM EST  RP Workstation: [id number]    Microbiology: Results for orders placed or performed during the hospital encounter of 05/23/24  Blood culture (routine x 2)     Status: None   Collection Time: 05/23/24  5:44 PM   Specimen: BLOOD  Result Value Ref Range Status   Specimen Description BLOOD BLOOD RIGHT HAND  Final   Special Requests   Final    BOTTLES DRAWN AEROBIC ONLY Blood Culture results may not be optimal due to an inadequate volume of blood received in culture bottles   Culture   Final    NO GROWTH 5 DAYS Performed at [facility], [address] [place], [place] [address]    Report Status 05/28/2024 FINAL  Final  Blood culture (routine x 2)     Status: None   Collection Time: 05/23/24  5:51 PM   Specimen: BLOOD  Result Value Ref Range Status   Specimen Description BLOOD RIGHT ANTECUBITAL  Final   Special Requests   Final    BOTTLES DRAWN AEROBIC AND ANAEROBIC Blood Culture adequate volume   Culture   Final    NO GROWTH 5 DAYS Performed at [facility], [address] [place], [place] [address]    Report Status 05/28/2024 FINAL  Final  Urine Culture     Status: Abnormal   Collection Time: 05/24/24  5:04 AM   Specimen: Urine, Clean Catch  Result Value Ref Range Status   Specimen Description   Final    URINE, CLEAN CATCH  Performed at [facility], [address] [place], [place] [address]    Special Requests   Final    NONE Performed at [facility], [address], [place], [place] [address]    Culture MULTIPLE SPECIES PRESENT, SUGGEST RECOLLECTION (A)  Final   Report Status 05/25/2024 FINAL  Final    Labs: CBC: Recent Labs  Lab 05/23/24 1518 05/24/24 0426 05/25/24 0438  WBC 14.6* 13.7* 12.8*  NEUTROABS 12.2*  --   --   HGB 12.7 11.1* 10.7*  HCT 37.8 33.3* 31.2*  MCV 98.7 98.2 96.9  PLT 191 177 184   Basic Metabolic Panel: Recent Labs  Lab 05/23/24 1518 05/24/24 0426 05/25/24 0438  NA 140 142 141  K 3.5 3.3* 3.9  CL 107 110 110  CO2 23 22 20*   GLUCOSE 133* 119* 114*  BUN 16 15 13   CREATININE 0.82 0.68 0.65  CALCIUM  9.6 9.0 8.8*  MG 2.3  --  2.2   Liver Function Tests: Recent Labs  Lab 05/23/24 1518  AST 20  ALT 11  ALKPHOS 109  BILITOT 1.0  PROT 7.2  ALBUMIN 4.2   CBG: No results for input(s): GLUCAP in the last 168 hours.  Discharge time spent: greater than 30 minutes.  Signed: Alm Schneider, MD [facility] 05/29/2024

## 2024-05-29 NOTE — Progress Notes (Signed)
 Report called to [facility] at [facility]. IV removed. Patient to be transported via bed to [facility]. Husband at bedside and made aware.

## 2024-05-29 NOTE — Progress Notes (Signed)
 Unsuccessful attempt x2 to call report to [facility].

## 2024-05-29 NOTE — Progress Notes (Signed)
 Mobility Specialist Progress Note:    05/29/24 1230  Mobility  Activity Pivoted/transferred from chair to bed  Level of Assistance Moderate assist, patient does 50-74% (+2)  Assistive Device None  Distance Ambulated (ft) 3 ft  Range of Motion/Exercises Active;All extremities  Activity Response Tolerated well  Mobility Referral Yes  Mobility visit 1 Mobility  Mobility Specialist Start Time (ACUTE ONLY) 1230  Mobility Specialist Stop Time (ACUTE ONLY) 1245  Mobility Specialist Time Calculation (min) (ACUTE ONLY) 15 min   Pt received in chair, RN requesting assistance transferring to bed. Required ModA +2 to stand and transfer with no AD. Tolerated well, required verbal cues d/t dementia. RN in room, all needs met.  Phineas Mcenroe Mobility Specialist Please contact via Special Educational Needs Teacher or  Rehab office at [phone]

## 2024-05-29 NOTE — Progress Notes (Signed)
 Mobility Specialist Progress Note:    05/29/24 0920  Mobility  Activity Ambulated with assistance;Pivoted/transferred from bed to chair  Level of Assistance Maximum assist, patient does 25-49%  Assistive Device None  Distance Ambulated (ft) 15 ft  Range of Motion/Exercises Active;All extremities  Activity Response Tolerated well  Mobility Referral Yes  Mobility visit 1 Mobility  Mobility Specialist Start Time (ACUTE ONLY) 0920  Mobility Specialist Stop Time (ACUTE ONLY) [id number]  Mobility Specialist Time Calculation (min) (ACUTE ONLY) 22 min   Pt received in bed, agreeable to mobility. Joint session with physical therapy, required MaxA for supine to sit and ModA for sit to stand/ambulate with no AD. Tolerated well, required verbal cues d/t dementia. Left in chair, husband in room. All needs met.  Daevion Navarette Mobility Specialist Please contact via Special Educational Needs Teacher or  Rehab office at [phone]

## 2024-05-29 NOTE — Progress Notes (Signed)
 Physical Therapy Treatment Patient Details Name: Diana Smith MRN: [id number] DOB: May 05, 1946 Today's Date: 05/29/2024   History of Present Illness 78 y.o. female with medical history significant for dementia, hypertension, hyperlipidemia stroke, NASH, depression, RBBB presented with altered mental status.  The patient was in the ED on 05/22/2024 because of falls and confusion.  The patient had an unwitnessed fall in the middle of the night 11/24-11/25.  Spouse had difficulty getting her up off the floor.  EMS was activated and the patient was brought to the emergency department.  Labs including CMP, CBC were unremarkable with a TSH 4.420.  Ammonia was 18.  UA was negative for pyuria.  CT of the brain was negative for acute findings.  CT cervical spine was negative for fracture or dislocation or subluxation.  The patient was discharged home in stable condition after being given Ativan  IV for agitation..  After pt returned home, husband gave patient's 2 more doses of Xanax   0.25 mg. On the morning of 05/23/2024, spouse had difficulty waking the patient up due to somnolence.  At baseline, the patient has cognitive impairment..  She is able to answer simple questions and speak fluently.  She is pleasantly confused.  She is able to ambulate without assistive devices.  She was able to clothe herself.  Patient has had decreased oral intake for the past 2 days.    PT Comments  Patient agreeable to PT treatment session. Patient's husband present during session. Mobility specialist present and assists with treatment session as well. Patient was received supine in bed, required mod/max to get EOB. Initially demonstrated poor seated balance via posterior lean but improves with verbal and tactile cueing. One STS from bed with max assist, along with moderate assist during bed to chair transfer. Pt completed 3 additional STS from chair for LE strengthening, improves to moderate assist. Moderate assist during  forward/backward steps in room without RW. Pt required consistent step by step cueing during session. Pt with appropriate fatigue at end of session. Pt tolerated sitting in chair at end of session, call button in reach, and husband present. Patient will benefit from continued skilled physical therapy acutely and in recommended venue in order to address current deficits and improve overall function.     If plan is discharge home, recommend the following: A lot of help with bathing/dressing/bathroom;A lot of help with walking and/or transfers;Help with stairs or ramp for entrance;Assist for transportation;Assistance with cooking/housework   Can travel by private vehicle     No  Equipment Recommendations  None recommended by PT    Recommendations for Other Services       Precautions / Restrictions Precautions Precautions: Fall Recall of Precautions/Restrictions: Impaired Restrictions Weight Bearing Restrictions Per Provider Order: No     Mobility  Bed Mobility Overal bed mobility: Needs Assistance Bed Mobility: Supine to Sit     Supine to sit: Mod assist, Max assist     General bed mobility comments: pt demo slow labored movement to get bedside, required repeated verbal/tactile step by step cueing throughout transfer, assisted at LE and trunk via pull to sit    Transfers Overall transfer level: Needs assistance Equipment used: Rolling walker (2 wheels), 1 person hand held assist, 2 person hand held assist Transfers: Sit to/from Stand, Bed to chair/wheelchair/BSC Sit to Stand: Mod assist, +2 safety/equipment   Step pivot transfers: Mod assist       General transfer comment: Cont difficulty with RW use, pt becomes distracted, STS from bed and  recliner during session, initially max assist progressing to moderate with inc alertness, 4 STS total    Ambulation/Gait Ambulation/Gait assistance: Mod assist Gait Distance (Feet): 10 Feet Assistive device: 1 person hand held  assist Gait Pattern/deviations: Decreased step length - left, Decreased stance time - right, Decreased stride length Gait velocity: slow     General Gait Details: Ambulated in room with 1 person hand held assist as RW is distracting, required consistent cueing for steps due to AMS, repeatedly cueing for foot clearance and inc step length   Stairs             Wheelchair Mobility     Tilt Bed    Modified Rankin (Stroke Patients Only)       Balance Overall balance assessment: Needs assistance Sitting-balance support: Feet supported, No upper extremity supported Sitting balance-Leahy Scale: Fair Sitting balance - Comments: fair/poor seated at EOB, pt demo heavy posterior lean initially that improves with duration of task Postural control: Posterior lean Standing balance support: During functional activity, Bilateral upper extremity supported Standing balance-Leahy Scale: Poor Standing balance comment: using RW, or hand held assist       Communication Communication Communication: Impaired Factors Affecting Communication: Difficulty expressing self  Cognition Arousal: Alert Behavior During Therapy: WFL for tasks assessed/performed   PT - Cognitive impairments: History of cognitive impairments       Following commands: Impaired Following commands impaired: Follows one step commands inconsistently    Cueing Cueing Techniques: Verbal cues, Gestural cues, Tactile cues, Visual cues  Exercises      General Comments        Pertinent Vitals/Pain Pain Assessment Pain Assessment: No/denies pain    Home Living                          Prior Function            PT Goals (current goals can now be found in the care plan section) Acute Rehab PT Goals Patient Stated Goal: return home with family to assist PT Goal Formulation: With patient/family Time For Goal Achievement: 06/08/24 Potential to Achieve Goals: Good Progress towards PT goals: Progressing  toward goals    Frequency    Min 3X/week      PT Plan      Co-evaluation              AM-PAC PT 6 Clicks Mobility   Outcome Measure  Help needed turning from your back to your side while in a flat bed without using bedrails?: A Lot Help needed moving from lying on your back to sitting on the side of a flat bed without using bedrails?: A Lot Help needed moving to and from a bed to a chair (including a wheelchair)?: A Lot Help needed standing up from a chair using your arms (e.g., wheelchair or bedside chair)?: A Lot Help needed to walk in hospital room?: A Lot Help needed climbing 3-5 steps with a railing? : A Lot 6 Click Score: 12    End of Session Equipment Utilized During Treatment: Gait belt Activity Tolerance: Patient tolerated treatment well;Patient limited by fatigue Patient left: in chair;with call bell/phone within reach;with family/visitor present Nurse Communication: Mobility status PT Visit Diagnosis: Unsteadiness on feet (R26.81);Other abnormalities of gait and mobility (R26.89);Muscle weakness (generalized) (M62.81)     Time: 9069-9053 PT Time Calculation (min) (ACUTE ONLY): 16 min  Charges:    $Therapeutic Activity: 8-22 mins PT General Charges $$ ACUTE PT  VISIT: 1 Visit                     12:19 PM, 05/29/24 Rosaria Settler, PT, DPT [place] [facility]

## 2024-05-29 NOTE — Care Management Important Message (Signed)
 Important Message  Patient Details  Name: Diana Smith MRN: [id number] Date of Birth: 1945/11/04   Important Message Given:  Yes - Medicare IM     Ekaterini Capitano L Hovanes Hymas 05/29/2024, 11:45 AM

## 2024-05-30 ENCOUNTER — Encounter: Payer: Self-pay | Admitting: Adult Health

## 2024-05-30 ENCOUNTER — Non-Acute Institutional Stay (SKILLED_NURSING_FACILITY): Payer: Self-pay | Admitting: Adult Health

## 2024-05-30 DIAGNOSIS — Z8673 Personal history of transient ischemic attack (TIA), and cerebral infarction without residual deficits: Secondary | ICD-10-CM | POA: Diagnosis not present

## 2024-05-30 DIAGNOSIS — N3281 Overactive bladder: Secondary | ICD-10-CM

## 2024-05-30 DIAGNOSIS — G9341 Metabolic encephalopathy: Secondary | ICD-10-CM

## 2024-05-30 DIAGNOSIS — F0390 Unspecified dementia without behavioral disturbance: Secondary | ICD-10-CM

## 2024-05-30 DIAGNOSIS — E785 Hyperlipidemia, unspecified: Secondary | ICD-10-CM | POA: Diagnosis not present

## 2024-05-30 DIAGNOSIS — F411 Generalized anxiety disorder: Secondary | ICD-10-CM | POA: Diagnosis not present

## 2024-05-30 DIAGNOSIS — F0280 Dementia in other diseases classified elsewhere without behavioral disturbance: Secondary | ICD-10-CM

## 2024-05-30 NOTE — Progress Notes (Signed)
 Location:  [facility] Room Number: [id number] Place of Service:  SNF (31)   CODE STATUS: dnr   Allergies  Allergen Reactions   Ativan  [Lorazepam ] Other (See Comments)    Made her out of it, made her memory worse for an extended time period after taking   Gatifloxacin Other (See Comments)    Unknown    Hydrocodone Other (See Comments)    Unknown    Keflex  [Cephalexin ] Other (See Comments)    Unknown    Lorabid [Loracarbef] Other (See Comments)    Unknown    Minocycline Other (See Comments)    Unknown     Chief Complaint  Patient presents with   Hospitalization Follow-up    HPI:  She is a 78 year old woman who has been hospitalized from 05-23-24 through 05-29-24. Her medical history includes: dementia; hypertension; hyperlipidemia; cva; nash; depression. She presented to the ED with altered mental status. She had been in the ED previously with falls and confusion. Her work up at that time was negative for acute processes and was sent home after receivin IV ativan  for agitation. After returning home her husband gave her 2 more doses of xanax  0.25 mg. On 05-23-24 her husband had difficulty waking her up. She does have cognitive impairment; she is able to answer simple questions. She normally ambulates without assistive devices.  Acute metabolic encephalopathy: blood work normal; ct of head without acute findings. She has returned back to her baseline.  She was treated for presumed UTI with merrem  for 5 days.  She is here for short term rehab with her goal to return back home. She will continue to be followed for her chronic illnesses including: History of ischemic stroke: Generalized anxiety disorder:  Dementia due to alzheimer's disease/major neurocognitive disorder        Past Medical History:  Diagnosis Date   Abdominal pain 04/24/2022   Actinic keratosis    Allergic rhinitis due to pollen    Cerebrovascular disease    Dementia due to Alzheimer's disease  09/10/2022   Essential hypertension 04/24/2022   Frequent urinary tract infections    Gastro-esophageal reflux disease without esophagitis    Generalized anxiety disorder    Heartburn    Hematochezia    History of COVID-19    Hyperglycemia    Hyperlipidemia    Hypoalbuminemia due to protein-calorie malnutrition 04/24/2022   Insomnia 04/24/2022   Major depressive disorder    Nonalcoholic steatohepatitis (NASH)    Osteoarthritis of left knee 03/21/2022   Osteoarthritis of right knee 01/25/2021   Overactive bladder    Personal history of colonic polyps    Prediabetes    Pulmonary nodule 04/24/2022   Pure hypercholesterolemia    Right bundle branch block    Rosacea    Sciatica, right side    Slow transit constipation    Starvation ketoacidosis 04/24/2022   UTI (urinary tract infection) 04/24/2022   Vitamin B12 deficiency (non anemic)    Vitamin D  deficiency     Past Surgical History:  Procedure Laterality Date   HYSTEROTOMY     MASS EXCISION  10/08/2011   Procedure: MINOR EXCISION OF MASS;  Surgeon: Lamar LULLA Leonor Mickey., MD;  Location: [facility];  Service: Orthopedics;  Laterality: Left;  excisional biopsy dorsum left long finger MCP joint    Social History   Socioeconomic History   Marital status: Married    Spouse name: Not on file   Number of children: Not on  file   Years of education: 12   Highest education level: High school graduate  Occupational History   Occupation: Retired    Comment: Environmental consultant  Tobacco Use   Smoking status: Never   Smokeless tobacco: Never  Vaping Use   Vaping status: Never Used  Substance and Sexual Activity   Alcohol use: Never   Drug use: Never   Sexual activity: Not Currently  Other Topics Concern   Not on file  Social History Narrative   Left handed    Lives with husband    Retired   Caffeine prn   No falls   Social Drivers of Corporate Investment Banker Strain: Not on file  Food Insecurity:  No Food Insecurity (05/23/2024)   Hunger Vital Sign    Worried About Running Out of Food in the Last Year: Never true    Ran Out of Food in the Last Year: Never true  Transportation Needs: No Transportation Needs (05/23/2024)   PRAPARE - Administrator, Civil Service (Medical): No    Lack of Transportation (Non-Medical): No  Physical Activity: Not on file  Stress: Not on file  Social Connections: Socially Integrated (05/23/2024)   Social Connection and Isolation Panel    Frequency of Communication with Friends and Family: More than three times a week    Frequency of Social Gatherings with Friends and Family: More than three times a week    Attends Religious Services: More than 4 times per year    Active Member of [employer] or Organizations: Yes    Attends Banker Meetings: Never    Marital Status: Married  Catering Manager Violence: Not At Risk (05/23/2024)   Humiliation, Afraid, Rape, and Kick questionnaire    Fear of Current or Ex-Partner: No    Emotionally Abused: No    Physically Abused: No    Sexually Abused: No   Family History  Problem Relation Age of Onset   Dementia Father    Memory loss Father    Breast cancer Neg Hx       VITAL SIGNS BP (!) 156/86   Pulse 70   Temp 97.6 F (36.4 C)   Resp 20   Ht 5' 7 (1.702 m)   Wt 164 lb 3.2 oz (74.5 kg)   SpO2 97%   BMI 25.72 kg/m   Outpatient Encounter Medications as of 05/30/2024  Medication Sig Note   aspirin  EC 81 MG tablet Take 1 tablet (81 mg total) by mouth daily. Swallow whole.    calcium  carbonate (OSCAL) 1500 (600 Ca) MG TABS tablet Take 600 mg of elemental calcium  by mouth in the morning and at bedtime.    escitalopram  (LEXAPRO ) 10 MG tablet Take 10 mg by mouth daily.    estradiol (ESTRACE) 0.01 % CREA vaginal cream Place 1 Applicatorful vaginally 2 (two) times a week.    Melatonin 10 MG TABS Take 5 mg by mouth at bedtime.    memantine  (NAMENDA ) 10 MG tablet Take 10 mg by mouth at  bedtime. 05/23/2024: Pt's spouse is unsure about this medication, was last filled in Sep 2025 for 90DS   nystatin  cream (MYCOSTATIN ) Apply 1 Application topically 2 (two) times daily as needed (yeast infection). Apply to stomach and private parts    rosuvastatin  (CRESTOR ) 20 MG tablet Take 1 tablet (20 mg total) by mouth daily.    [DISCONTINUED] estradiol (ESTRACE) 0.1 MG/GM vaginal cream Place 1 Applicatorful vaginally daily. Every day for 14 days, then  twice a week.    [DISCONTINUED] Melatonin 12 MG TABS Take 5 mg by mouth at bedtime.    [DISCONTINUED] Nystatin  (GERHARDT'S BUTT CREAM) CREA Apply 1 Application topically 2 (two) times daily.    No facility-administered encounter medications on file as of 05/30/2024.     SIGNIFICANT DIAGNOSTIC EXAMS  LABS  05-22-24: tsh 4.420 05-23-24: wbc 14.6; hgb 12.7; hct 37.8; mcv 98.7 plt 191; glucose 133; bun 16; creat 0.82; k +3.5; na++ 140; ca 9.6 gfr >60; protein 7.2 albumin 4.6  05-24-24: urine culture: multiple species.  05-25-24: wbc 12.8; hgb 10.7; hct 31.2; mcv 96.9 plt 184; glucose 114; bun 13; creat 0.65; k+ 3.9; na++ 141; ca 8.8 gfr >60; mag 2.2; vitamin B12: 832; folate 10.2   Review of Systems  Unable to perform ROS: Dementia    Physical Exam Constitutional:      General: She is not in acute distress.    Appearance: She is well-developed. She is not diaphoretic.  Neck:     Thyroid : No thyromegaly.  Cardiovascular:     Rate and Rhythm: Normal rate and regular rhythm.     Pulses: Normal pulses.     Heart sounds: Murmur heard.  Pulmonary:     Effort: Pulmonary effort is normal. No respiratory distress.     Breath sounds: Normal breath sounds.  Abdominal:     General: Bowel sounds are normal. There is no distension.     Palpations: Abdomen is soft.     Tenderness: There is no abdominal tenderness.  Musculoskeletal:        General: Normal range of motion.     Cervical back: Neck supple.     Right lower leg: No edema.     Left  lower leg: No edema.  Lymphadenopathy:     Cervical: No cervical adenopathy.  Skin:    General: Skin is warm and dry.  Neurological:     Mental Status: She is alert. Mental status is at baseline.  Psychiatric:        Mood and Affect: Mood normal.       ASSESSMENT/ PLAN:  TODAY  Acute metabolic encephalopathy: her cognition has returned back to baseline. She was treated for presumed UTI.   2. History of ischemic stroke: is on asa 81 mg daily   3. Generalized anxiety disorder: will continue lexaprol 10 mg daily  4. Dementia due to alzheimer's disease/major neurocognitive disorder: will continue namend 10 mg nightly; weight is 164 pounds ,  5. Overactive bladder: will continue estradiol 2 times weekly   6. Hyperlipidemia unspecified hyperlipidemia type: will continue crestor  10 mg nightly    Barnie Seip NP [facility]   call [phone]

## 2024-05-31 ENCOUNTER — Non-Acute Institutional Stay (SKILLED_NURSING_FACILITY): Payer: Self-pay | Admitting: Internal Medicine

## 2024-05-31 ENCOUNTER — Encounter: Payer: Self-pay | Admitting: Internal Medicine

## 2024-05-31 DIAGNOSIS — E8809 Other disorders of plasma-protein metabolism, not elsewhere classified: Secondary | ICD-10-CM

## 2024-05-31 DIAGNOSIS — G9341 Metabolic encephalopathy: Secondary | ICD-10-CM | POA: Diagnosis not present

## 2024-05-31 DIAGNOSIS — F0280 Dementia in other diseases classified elsewhere without behavioral disturbance: Secondary | ICD-10-CM

## 2024-05-31 DIAGNOSIS — G309 Alzheimer's disease, unspecified: Secondary | ICD-10-CM

## 2024-05-31 DIAGNOSIS — R531 Weakness: Secondary | ICD-10-CM | POA: Diagnosis not present

## 2024-05-31 DIAGNOSIS — I10 Essential (primary) hypertension: Secondary | ICD-10-CM | POA: Diagnosis not present

## 2024-05-31 DIAGNOSIS — E46 Unspecified protein-calorie malnutrition: Secondary | ICD-10-CM

## 2024-05-31 NOTE — Assessment & Plan Note (Signed)
 Here at SNF blood pressures have been elevated but seem to be dropping.  This is in the context of intermittent agitation in the context of vascular dementia.  She apparently was previously on an ARB. A vasodilating calcium  channel blocker will be considered if verified blood pressure average is elevated.

## 2024-05-31 NOTE — Patient Instructions (Signed)
 See assessment and plan under each diagnosis in the problem list and acutely for this visit

## 2024-05-31 NOTE — Assessment & Plan Note (Signed)
 Today she confabulates nonsensically and has difficulty following commands.  I discussed the CNS imaging results and they are likely implication with her husband.  He stated that the neurologist told him that she had Alzheimer's.  She appears to have vascular dementia.

## 2024-05-31 NOTE — Progress Notes (Unsigned)
 NURSING HOME LOCATION:  [facility] ROOM NUMBER:  [id number]  CODE STATUS:  DNR  PCP:  [employer] MD  This is a comprehensive admission note to this SNFperformed on this date less than 30 days from date of admission. Included are preadmission medical/surgical history; reconciled medication list; family history; social history and comprehensive review of systems.  Corrections and additions to the records were documented. Comprehensive physical exam was also performed. Additionally a clinical summary was entered for each active diagnosis pertinent to this admission in the Problem List to enhance continuity of care.  HPI: She was hospitalized 11/26 - 05/29/2024 presenting with altered mental status associated with a fall the night prior to admission.  Her spouse had difficulty getting her up from the floor.  In the ED TSH was therapeutic at 4.42; ammonia 18; UA negative for pyuria.  CT of the head revealed patchy periventricular and subcortical white matter hypoattenuation, likely representing chronic small vessel ischemic change.  There is mild ventricular and sulcal prominence secondary to age-related volume loss.  Calcified atherosclerotic plaque was noted within the cavernous/supraclinoid internal carotid arteries.  She was given IV Ativan  for agitation; she was felt stable enough to return home.  Apparently after returning home her husband gave her 2 additional doses of Xanax  0.25 mg.  On the morning of 11/26 he had difficulty awaking his wife due to excessive somnolence. Her husband took her back to the ED where low-grade temperature of 100.1 was documented.  White blood count was 14,600.  Initial H/H was 12.7/37.8.  UA revealed 21-50 WBCs.  IV fluids & antibiotics were initiated . She received a 5 day course of antibiotics.Urine culture had revealed multiple organisms. While hospitalized the patient did complain of abdominal pain.  CT of the abdomen/pelvis revealed no  hydronephrosis or perinephric stranding.  Inflammatory changes were noted in the perirectal fat with mild-moderate rectal distention suggesting possible proctitis.  Abdominal pain did improve and she was tolerating advancement of diet.  She had 4 BMs after administration of MiraLAX  with complete resolution of abdominal symptoms. Normochromic, normocytic anemia was present with nadir H/H of 10.7/31.2.  Folate and B12 levels were normal.  Mild hypocalcemia was present with a value of 8.8.  Creatinine was 0.65; GFR was > 60.  Total protein was normal at 7.2 and albumin at 4.2. Glucoses while hospitalized ranged from 101 up to 133. PT/OT consulted and recommended SNF placement for rehab.  Past medical and surgical history:includes dyslipidemia; allergic rhinitis; essential hypertension; GERD; generalized anxiety disorder; protein/caloric malnutrition; history of major depressive disorder; NASH; OAB; vitamin D  deficiency; vitamin D  deficiency; history of pulmonary nodule; and history of colon polyps. Surgeries and procedures include hysterectomy.  Family history: reviewed, non contributory due to advanced age.  Social history: Nondrinker; non-smoker.   Review of systems could not be completed due to severe dementia.  She also had difficulty following simple commands.  She can provide no meaningful history and simply voiced nonsensical phrases such as is that someone who is there?  I just do not know.  Home to get down to there.  Physical exam:  Pertinent or positive findings: She has somewhat of a puzzled countenance.  Eyebrows are essentially absent.  Hair is very fine. White roots are visible over the crown.  Dentition is immaculate.  Abdomen slightly protuberant.  Pedal pulses are good.  She has trace edema at the sock line.  Strength to opposition is symmetric but appears stronger in lower extremities than  in the upper extremities.  Again, she had difficulty following requests to oppose my hands  to test strength.  General appearance: Adequately nourished; no acute distress, increased work of breathing is present.   Lymphatic: No lymphadenopathy about the head, neck, axilla. Eyes: No conjunctival inflammation or lid edema is present. There is no scleral icterus. Ears:  External ear exam shows no significant lesions or deformities.   Nose:  External nasal examination shows no deformity or inflammation. Nasal mucosa are pink and moist without lesions, exudates Neck:  No thyromegaly, masses, tenderness noted.    Heart:  Normal rate and regular rhythm. S1 and S2 normal without gallop, murmur, click, rub.  Lungs: Chest clear to auscultation without wheezes, rhonchi, rales, rubs. Abdomen: Bowel sounds are normal.  Abdomen is soft and nontender with no organomegaly, hernias, masses. GU: Deferred  Extremities:  No cyanosis, clubbing. Neurologic exam: Balance, Rhomberg, finger to nose testing could not be completed due to clinical state Skin: Warm & dry w/o tenting. No significant lesions or rash.  See clinical summary under each active problem in the Problem List with associated updated therapeutic plan :  Acute metabolic encephalopathy Today she confabulates nonsensically and has difficulty following commands.  I discussed the CNS imaging results and they are likely implication with her husband.  He stated that the neurologist told him that she had Alzheimer's.  She appears to have vascular dementia.  Dementia due to Alzheimer's disease There is a history of intolerance to Aricept .  The CNS imaging shows patchy periventricular and subcortical white matter hypoattenuation, likely representing chronic small vessel ischemic change.  Mild ventricular and sulcal prominence secondary to age-related volume loss present as well.  Calcified atherosclerotic plaque noted within the cavernous/supraclinoid internal carotid arteries.  These findings suggest she has vascular dementia, not Alzheimer's.  This  was discussed with her husband.  Essential hypertension Here at SNF blood pressures have been elevated but seem to be dropping.  This is in the context of intermittent agitation in the context of vascular dementia.  She apparently was previously on an ARB. A vasodilating calcium  channel blocker will be considered if verified blood pressure average is elevated.  Hypoalbuminemia due to protein-calorie malnutrition Current albumin and total protein are normal with values of 7.2 and 4.2.  Nutritionist will assess here at the SNF.  Weakness No CK on record; she is on a hydrophilic statin.  On exam she exhibits adequate strength to opposition.  Current TSH is therapeutic at 4.42.  PT/OT at SNF as tolerated.

## 2024-05-31 NOTE — Assessment & Plan Note (Signed)
 There is a history of intolerance to Aricept .  The CNS imaging shows patchy periventricular and subcortical white matter hypoattenuation, likely representing chronic small vessel ischemic change.  Mild ventricular and sulcal prominence secondary to age-related volume loss present as well.  Calcified atherosclerotic plaque noted within the cavernous/supraclinoid internal carotid arteries.  These findings suggest she has vascular dementia, not Alzheimer's.  This was discussed with her husband.

## 2024-05-31 NOTE — Assessment & Plan Note (Signed)
 Current albumin and total protein are normal with values of 7.2 and 4.2.  Nutritionist will assess here at the SNF.

## 2024-05-31 NOTE — Assessment & Plan Note (Addendum)
 No CK on record; she is on a hydrophilic statin.  On exam she exhibits adequate strength to opposition.  Current TSH is therapeutic at 4.42.  PT/OT at SNF as tolerated.

## 2024-06-06 ENCOUNTER — Other Ambulatory Visit: Payer: Self-pay | Admitting: Adult Health

## 2024-06-06 MED ORDER — ALPRAZOLAM 0.25 MG PO TABS: 0.2500 mg | ORAL_TABLET | Freq: Three times a day (TID) | ORAL | 0 refills | Status: DC | PRN

## 2024-06-11 ENCOUNTER — Non-Acute Institutional Stay (SKILLED_NURSING_FACILITY): Payer: Self-pay | Admitting: Adult Health

## 2024-06-11 ENCOUNTER — Other Ambulatory Visit: Payer: Self-pay | Admitting: Adult Health

## 2024-06-11 ENCOUNTER — Encounter: Payer: Self-pay | Admitting: Adult Health

## 2024-06-11 DIAGNOSIS — Z8673 Personal history of transient ischemic attack (TIA), and cerebral infarction without residual deficits: Secondary | ICD-10-CM

## 2024-06-11 DIAGNOSIS — F0390 Unspecified dementia without behavioral disturbance: Secondary | ICD-10-CM | POA: Diagnosis not present

## 2024-06-11 DIAGNOSIS — G9341 Metabolic encephalopathy: Secondary | ICD-10-CM | POA: Diagnosis not present

## 2024-06-11 MED ORDER — ESCITALOPRAM OXALATE 10 MG PO TABS: 10.0000 mg | ORAL_TABLET | Freq: Every day | ORAL | 0 refills | Status: AC

## 2024-06-11 MED ORDER — ESTRADIOL 0.01 % VA CREA: 1.0000 | TOPICAL_CREAM | VAGINAL | 0 refills | Status: AC

## 2024-06-11 MED ORDER — ROSUVASTATIN CALCIUM 20 MG PO TABS: 20.0000 mg | ORAL_TABLET | Freq: Every day | ORAL | 0 refills | Status: AC

## 2024-06-11 MED ORDER — MEMANTINE HCL 10 MG PO TABS: 10.0000 mg | ORAL_TABLET | Freq: Every day | ORAL | 0 refills | Status: AC

## 2024-06-11 MED ORDER — ALPRAZOLAM 0.25 MG PO TABS: 0.2500 mg | ORAL_TABLET | Freq: Three times a day (TID) | ORAL | 0 refills | Status: DC | PRN

## 2024-06-11 MED ORDER — ALPRAZOLAM 0.5 MG PO TABS: 0.5000 mg | ORAL_TABLET | Freq: Three times a day (TID) | ORAL | 0 refills | Status: AC | PRN

## 2024-06-11 NOTE — Progress Notes (Unsigned)
 Location:  [facility] Room Number: 134 Place of Service:  SNF (31)   CODE STATUS: dnr  Allergies[1]  Chief Complaint  Patient presents with   Discharge Note    HPI:  She is being discharged to home health for pt/ot. There is no need for dme. She will need her prescriptions written and will need to follow up with her pcp. She had been hospitalized for acute metabolic encephalopathy and presumed UTI. She was admitted to this facility for short term rehab: ambulate 100 feet with rolling wlaker and min assist. Upper body min assist lower body mod assist. Steps are min assist with bilateral rails. She has had great emotional difficulty since her admission to this facility requiring an increase in her xanax .   Past Medical History:  Diagnosis Date   Abdominal pain 04/24/2022   Actinic keratosis    Allergic rhinitis due to pollen    Cerebrovascular disease    Dementia due to Alzheimer's disease 09/10/2022   Essential hypertension 04/24/2022   Frequent urinary tract infections    Gastro-esophageal reflux disease without esophagitis    Generalized anxiety disorder    Heartburn    Hematochezia    History of COVID-19    Hyperglycemia    Hyperlipidemia    Hypoalbuminemia due to protein-calorie malnutrition 04/24/2022   Insomnia 04/24/2022   Major depressive disorder    Nonalcoholic steatohepatitis (NASH)    Osteoarthritis of left knee 03/21/2022   Osteoarthritis of right knee 01/25/2021   Overactive bladder    Personal history of colonic polyps    Prediabetes    Pulmonary nodule 04/24/2022   Pure hypercholesterolemia    Right bundle branch block    Rosacea    Sciatica, right side    Slow transit constipation    Starvation ketoacidosis 04/24/2022   UTI (urinary tract infection) 04/24/2022   Vitamin B12 deficiency (non anemic)    Vitamin D  deficiency     Past Surgical History:  Procedure Laterality Date   HYSTEROTOMY     MASS EXCISION  10/08/2011    Procedure: MINOR EXCISION OF MASS;  Surgeon: Lamar LULLA Leonor Mickey., MD;  Location:  S[facility];  Service: Orthopedics;  Laterality: Left;  excisional biopsy dorsum left long finger MCP joint    Social History   Socioeconomic History   Marital status: Married    Spouse name: Not on file   Number of children: Not on file   Years of education: 12   Highest education level: High school graduate  Occupational History   Occupation: Retired    Comment: Environmental consultant  Tobacco Use   Smoking status: Never   Smokeless tobacco: Never  Vaping Use   Vaping status: Never Used  Substance and Sexual Activity   Alcohol use: Never   Drug use: Never   Sexual activity: Not Currently  Other Topics Concern   Not on file  Social History Narrative   Left handed    Lives with husband    Retired   Caffeine prn   No falls   Social Drivers of Health   Tobacco Use: Low Risk (06/11/2024)   Patient History    Smoking Tobacco Use: Never    Smokeless Tobacco Use: Never    Passive Exposure: Not on file  Financial Resource Strain: Not on file  Food Insecurity: No Food Insecurity (05/23/2024)   Epic    Worried About Radiation Protection Practitioner of Food in the Last Year: Never true    Ran  Out of Food in the Last Year: Never true  Transportation Needs: No Transportation Needs (05/23/2024)   Epic    Lack of Transportation (Medical): No    Lack of Transportation (Non-Medical): No  Physical Activity: Not on file  Stress: Not on file  Social Connections: Socially Integrated (05/23/2024)   Social Connection and Isolation Panel    Frequency of Communication with Friends and Family: More than three times a week    Frequency of Social Gatherings with Friends and Family: More than three times a week    Attends Religious Services: More than 4 times per year    Active Member of [employer] or Organizations: Yes    Attends Banker Meetings: Never    Marital Status: Married  Catering Manager  Violence: Not At Risk (05/23/2024)   Epic    Fear of Current or Ex-Partner: No    Emotionally Abused: No    Physically Abused: No    Sexually Abused: No  Depression (PHQ2-9): Not on file  Alcohol Screen: Not on file  Housing: Low Risk (05/23/2024)   Epic    Unable to Pay for Housing in the Last Year: No    Number of Times Moved in the Last Year: 0    Homeless in the Last Year: No  Utilities: Not At Risk (05/23/2024)   Epic    Threatened with loss of utilities: No  Health Literacy: Not on file   Family History  Problem Relation Age of Onset   Dementia Father    Memory loss Father    Breast cancer Neg Hx       VITAL SIGNS BP 124/72   Pulse 90   Temp (!) 97.5 F (36.4 C)   Resp 20   Ht 5' 7 (1.702 m)   Wt 163 lb 3.2 oz (74 kg)   SpO2 97%   BMI 25.56 kg/m   Outpatient Encounter Medications as of 06/11/2024  Medication Sig   ALPRAZolam  (XANAX ) 0.5 MG tablet Take 1 tablet (0.5 mg total) by mouth 3 (three) times daily as needed for anxiety (also give 0.5 mg nightly).   aspirin  EC 81 MG tablet Take 1 tablet (81 mg total) by mouth daily. Swallow whole.   calcium  carbonate (OSCAL) 1500 (600 Ca) MG TABS tablet Take 600 mg of elemental calcium  by mouth in the morning and at bedtime.   escitalopram  (LEXAPRO ) 10 MG tablet Take 1 tablet (10 mg total) by mouth daily.   estradiol  (ESTRACE ) 0.01 % CREA vaginal cream Place 1 Applicatorful vaginally 2 (two) times a week.   Melatonin 10 MG TABS Take 5 mg by mouth at bedtime.   memantine  (NAMENDA ) 10 MG tablet Take 1 tablet (10 mg total) by mouth at bedtime.   nystatin  cream (MYCOSTATIN ) Apply 1 Application topically 2 (two) times daily as needed (yeast infection). Apply to stomach and private parts   rosuvastatin  (CRESTOR ) 20 MG tablet Take 1 tablet (20 mg total) by mouth daily.   No facility-administered encounter medications on file as of 06/11/2024.     SIGNIFICANT DIAGNOSTIC EXAMS  LABS  05-22-24: tsh 4.420 05-23-24: wbc  14.6; hgb 12.7; hct 37.8; mcv 98.7 plt 191; glucose 133; bun 16; creat 0.82; k +3.5; na++ 140; ca 9.6 gfr >60; protein 7.2 albumin 4.6  05-24-24: urine culture: multiple species.  05-25-24: wbc 12.8; hgb 10.7; hct 31.2; mcv 96.9 plt 184; glucose 114; bun 13; creat 0.65; k+ 3.9; na++ 141; ca 8.8 gfr >60; mag 2.2; vitamin B12: 832;  folate 10.2   Review of Systems  Unable to perform ROS: Dementia    Physical Exam Constitutional:      General: She is not in acute distress.    Appearance: She is well-developed. She is not diaphoretic.  Neck:     Thyroid : No thyromegaly.  Cardiovascular:     Rate and Rhythm: Normal rate and regular rhythm.     Pulses: Normal pulses.     Heart sounds: Murmur heard.  Pulmonary:     Effort: Pulmonary effort is normal. No respiratory distress.     Breath sounds: Normal breath sounds.  Abdominal:     General: Bowel sounds are normal. There is no distension.     Palpations: Abdomen is soft.     Tenderness: There is no abdominal tenderness.  Musculoskeletal:        General: Normal range of motion.     Cervical back: Neck supple.     Right lower leg: No edema.     Left lower leg: No edema.  Lymphadenopathy:     Cervical: No cervical adenopathy.  Skin:    General: Skin is warm and dry.  Neurological:     Mental Status: She is alert. Mental status is at baseline.  Psychiatric:        Mood and Affect: Mood normal.      ASSESSMENT/ PLAN:  Patient is being discharged with the following home health services: pt/ot to evaluate and treat as indicated for gait balance strength adl training.     Patient is being discharged with the following durable medical equipment:  none needed   Patient has been advised to f/u with their PCP in 1-2 weeks to for a transitions of care visit.  Social services at their facility was responsible for arranging this appointment.  Pt was provided with adequate prescriptions of noncontrolled medications to reach the scheduled  appointment .  For controlled substances, a limited supply was provided as appropriate for the individual patient.  If the pt normally receives these medications from a pain clinic or has a contract with another physician, these medications should be received from that clinic or physician only).    Medications have been sent to [facility]    Barnie Seip NP [facility]   call [phone]      [1]  Allergies Allergen Reactions   Ativan  [Lorazepam ] Other (See Comments)    Made her out of it, made her memory worse for an extended time period after taking   Gatifloxacin Other (See Comments)    Unknown    Hydrocodone Other (See Comments)    Unknown    Keflex  [Cephalexin ] Other (See Comments)    Unknown    Lorabid [Loracarbef] Other (See Comments)    Unknown    Minocycline Other (See Comments)    Unknown

## 2024-06-18 DIAGNOSIS — G9341 Metabolic encephalopathy: Secondary | ICD-10-CM | POA: Diagnosis not present

## 2024-06-18 DIAGNOSIS — Z09 Encounter for follow-up examination after completed treatment for conditions other than malignant neoplasm: Secondary | ICD-10-CM | POA: Diagnosis not present

## 2024-06-27 ENCOUNTER — Emergency Department (HOSPITAL_COMMUNITY)

## 2024-06-27 ENCOUNTER — Emergency Department (HOSPITAL_COMMUNITY)
Admission: EM | Admit: 2024-06-27 | Discharge: 2024-06-27 | Disposition: A | Discharge status: Home / Self Care | Attending: Emergency Medicine | Admitting: Emergency Medicine

## 2024-06-27 ENCOUNTER — Encounter (HOSPITAL_COMMUNITY): Payer: Self-pay

## 2024-06-27 DIAGNOSIS — F0390 Unspecified dementia without behavioral disturbance: Secondary | ICD-10-CM | POA: Diagnosis not present

## 2024-06-27 DIAGNOSIS — K625 Hemorrhage of anus and rectum: Secondary | ICD-10-CM | POA: Diagnosis present

## 2024-06-27 DIAGNOSIS — Z7982 Long term (current) use of aspirin: Secondary | ICD-10-CM | POA: Diagnosis not present

## 2024-06-27 LAB — COMPREHENSIVE METABOLIC PANEL WITH GFR
ALT: 14 U/L (ref 0–44)
AST: 24 U/L (ref 15–41)
Albumin: 4.2 g/dL (ref 3.5–5.0)
Alkaline Phosphatase: 147 U/L — ABNORMAL HIGH (ref 38–126)
Anion gap: 14 (ref 5–15)
BUN: 18 mg/dL (ref 8–23)
CO2: 22 mmol/L (ref 22–32)
Calcium: 10 mg/dL (ref 8.9–10.3)
Chloride: 104 mmol/L (ref 98–111)
Creatinine, Ser: 0.84 mg/dL (ref 0.44–1.00)
GFR, Estimated: >60 mL/min
Glucose, Bld: 125 mg/dL — ABNORMAL HIGH (ref 70–99)
Potassium: 4.7 mmol/L (ref 3.5–5.1)
Sodium: 140 mmol/L (ref 135–145)
Total Bilirubin: 0.5 mg/dL (ref 0.0–1.2)
Total Protein: 7.4 g/dL (ref 6.5–8.1)

## 2024-06-27 LAB — CBC
HCT: 39.6 % (ref 36.0–46.0)
Hemoglobin: 12.9 g/dL (ref 12.0–15.0)
MCH: 32.5 pg (ref 26.0–34.0)
MCHC: 32.6 g/dL (ref 30.0–36.0)
MCV: 99.7 fL (ref 80.0–100.0)
Platelets: 251 K/uL (ref 150–400)
RBC: 3.97 MIL/uL (ref 3.87–5.11)
RDW: 13 % (ref 11.5–15.5)
WBC: 8.1 K/uL (ref 4.0–10.5)
nRBC: 0 % (ref 0.0–0.2)

## 2024-06-27 LAB — TYPE AND SCREEN
ABO/RH(D): A POS
Antibody Screen: NEGATIVE

## 2024-06-27 LAB — PROTIME-INR
INR: 1 (ref 0.8–1.2)
Prothrombin Time: 13.7 s (ref 11.4–15.2)

## 2024-06-27 MED ORDER — LORAZEPAM 2 MG/ML IJ SOLN
1.0000 mg | Freq: Once | INTRAMUSCULAR | Status: DC
  Filled 2024-06-27: qty 1

## 2024-06-27 MED ORDER — ALPRAZOLAM 0.5 MG PO TABS
0.5000 mg | ORAL_TABLET | Freq: Once | ORAL | Status: AC
  Administered 2024-06-27: 0.5 mg via ORAL
  Filled 2024-06-27: qty 1

## 2024-06-27 MED ORDER — HYDROXYZINE HCL 50 MG/ML IM SOLN
50.0000 mg | Freq: Once | INTRAMUSCULAR | Status: AC
  Administered 2024-06-27: 50 mg via INTRAMUSCULAR
  Filled 2024-06-27: qty 1

## 2024-06-27 NOTE — ED Provider Notes (Signed)
 " [place] EMERGENCY DEPARTMENT AT [facility] Provider Note   CSN: [id number] Arrival date & time: 06/27/24  1059     Patient presents with: Hematochezia   Diana Smith is a 78 y.o. female.  {Add pertinent medical, surgical, social history, OB history to YEP:67052} Patient has a history of dementia.  She is only oriented to person normally.  According to her son the patient had some rectal bleeding today that was bright red.  She had 1 episode   Rectal Bleeding      Prior to Admission medications  Medication Sig Start Date End Date Taking? Authorizing Provider  ALPRAZolam  (XANAX ) 0.5 MG tablet Take 1 tablet (0.5 mg total) by mouth 3 (three) times daily as needed for anxiety (also give 0.5 mg nightly). 06/11/24   Landy Barnie RAMAN, NP  aspirin  EC 81 MG tablet Take 1 tablet (81 mg total) by mouth daily. Swallow whole. 12/28/22   Pearlean Manus, MD  calcium  carbonate (OSCAL) 1500 (600 Ca) MG TABS tablet Take 600 mg of elemental calcium  by mouth in the morning and at bedtime.    [provider]  escitalopram  (LEXAPRO ) 10 MG tablet Take 1 tablet (10 mg total) by mouth daily. 06/11/24   Landy Barnie RAMAN, NP  estradiol  (ESTRACE ) 0.01 % CREA vaginal cream Place 1 Applicatorful vaginally 2 (two) times a week. 06/11/24   Landy Barnie RAMAN, NP  Melatonin 10 MG TABS Take 5 mg by mouth at bedtime.    [provider]  memantine  (NAMENDA ) 10 MG tablet Take 1 tablet (10 mg total) by mouth at bedtime. 06/11/24   Landy Barnie RAMAN, NP  nystatin  cream (MYCOSTATIN ) Apply 1 Application topically 2 (two) times daily as needed (yeast infection). Apply to stomach and private parts 11/20/23   [provider]  rosuvastatin  (CRESTOR ) 20 MG tablet Take 1 tablet (20 mg total) by mouth daily. 06/11/24 06/11/25  Landy Barnie RAMAN, NP    Allergies: Ativan  [lorazepam ], Gatifloxacin, Hydrocodone, Keflex  [cephalexin ], Lorabid [loracarbef], and Minocycline    Review of Systems   Gastrointestinal:  Positive for hematochezia.    Updated Vital Signs BP (!) 142/62   Pulse 93   Temp 98.1 F (36.7 C) (Oral)   Resp 12   SpO2 94%   Physical Exam  (all labs ordered are listed, but only abnormal results are displayed) Labs Reviewed  COMPREHENSIVE METABOLIC PANEL WITH GFR - Abnormal; Notable for the following components:      Result Value   Glucose, Bld 125 (*)    Alkaline Phosphatase 147 (*)    All other components within normal limits  CBC  PROTIME-INR  POC OCCULT BLOOD, ED  TYPE AND SCREEN    EKG: None  Radiology: CT ABDOMEN PELVIS WO CONTRAST Result Date: 06/27/2024 EXAM: CT ABDOMEN AND PELVIS WITHOUT CONTRAST 06/27/2024 04:49:45 PM TECHNIQUE: CT of the abdomen and pelvis was performed without the administration of intravenous contrast. Multiplanar reformatted images are provided for review. Automated exposure control, iterative reconstruction, and/or weight-based adjustment of the mA/kV was utilized to reduce the radiation dose to as low as reasonably achievable. COMPARISON: CT abdomen and pelvis 05/24/2024. CLINICAL HISTORY: Abdominal pain, acute, nonlocalized. FINDINGS: LOWER CHEST: There is a 2 mm nodule in the left lower lobe which was not definitely seen on prior. LIVER: The liver is unremarkable. GALLBLADDER AND BILE DUCTS: Gallbladder is unremarkable. No biliary ductal dilatation. SPLEEN: No acute abnormality. PANCREAS: No acute abnormality. ADRENAL GLANDS: No acute abnormality. KIDNEYS, URETERS AND BLADDER: There  is a 2.6 cm cyst in the superior pole of the left kidney and a 4.7 cm cyst in the inferior pole of the left kidney. Per consensus, no follow-up is needed for simple Bosniak type 1 and 2 renal cysts, unless the patient has a malignancy history or risk factors. No stones in the kidneys or ureters. No hydronephrosis. No perinephric or periureteral stranding. Urinary bladder is unremarkable. GI AND BOWEL: Stomach demonstrates no acute abnormality.  The appendix appears within normal limits. There is no bowel obstruction. PERITONEUM AND RETROPERITONEUM: No ascites. No free air. VASCULATURE: Peri atherosclerotic calcifications of the aorta and iliac arteries. LYMPH NODES: No lymphadenopathy. REPRODUCTIVE ORGANS: Uterus is surgically absent. BONES AND SOFT TISSUES: No acute osseous abnormality. No focal soft tissue abnormality. IMPRESSION: 1. No acute findings in the abdomen or pelvis. 2. 2 mm left lower lobe pulmonary nodule, not definitely seen on prior; no routine follow-up imaging is recommended per Fleischner Society Guidelines. 3. Left renal cysts measuring up to 4.7 cm, compatible with benign simple cysts, with no follow-up imaging recommended. 4. Aortoiliac atherosclerotic calcifications. Electronically signed by: Greig Pique MD 06/27/2024 05:51 PM EST RP Workstation: [id number]    {Document cardiac monitor, telemetry assessment procedure when appropriate:32947} Procedures   Medications Ordered in the ED  ALPRAZolam  (XANAX ) tablet 0.5 mg (has no administration in time range)  hydrOXYzine (VISTARIL) injection 50 mg (50 mg Intramuscular Given 06/27/24 1556)      {Click here for ABCD2, HEART and other calculators REFRESH Note before signing:1}                              Medical Decision Making Amount and/or Complexity of Data Reviewed Labs: ordered. Radiology: ordered.  Risk Prescription drug management.   Patient with rectal bleeding x 1.  Normal hemoglobin and normal CT scan of the abdomen.  No external hemorrhoids seen.  Patient will follow-up with her family doctor and is referred to GI  {Document critical care time when appropriate  Document review of labs and clinical decision tools ie CHADS2VASC2, etc  Document your independent review of radiology images and any outside records  Document your discussion with family members, caretakers and with consultants  Document social determinants of health affecting pt's care   Document your decision making why or why not admission, treatments were needed:32947:::1}   Final diagnoses:  Rectal bleeding    ED Discharge Orders     None        "

## 2024-06-27 NOTE — ED Triage Notes (Signed)
 Pt presents with son and husband who report that pt had BM earlier today with bright red blood in it. They report pt has hx of dementia. No reported blood thinners per family.

## 2024-06-27 NOTE — Discharge Instructions (Signed)
 Follow-up with your family doctor next week and also follow-up with gastroenterology in a week return if problems

## 2024-06-27 NOTE — ED Provider Triage Note (Signed)
 Emergency Medicine Provider Triage Evaluation Note  Diana Smith , a 78 y.o. female  was evaluated in triage.  Pt complains of improvement after reporting found with primary blood along with basic morning.  Significant insulin, provide history: History of pulmonary bedside cardiovascular history.  Reportedly had some level of constipation likely attributed to poor oral intake. BM every 2-3 days. She is on aspirin  but no blood thinners.  Review of Systems  Positive: As above Negative: As above  Physical Exam  BP 99/71 (BP Location: Left Arm)   Pulse 60   Temp 98.3 F (36.8 C) (Oral)   Resp 12   SpO2 100%  Gen:   Awake, no distress   Resp:  Normal effort MSK:   Moves extremities without difficulty  Other:  Cannot appreciate any signs of discomfort with abdominal palpation. No guarding seen.  Medical Decision Making  Medically screening exam initiated at 1:25 PM.  Appropriate orders placed.  Diana Smith was informed that the remainder of the evaluation will be completed by another provider, this initial triage assessment does not replace that evaluation, and the importance of remaining in the ED until their evaluation is complete.     Gaylord Seydel A, PA-C 06/27/24 1327

## 2024-06-27 NOTE — ED Notes (Signed)
 Pt is demented at baseline. Family was requesting meds for confusion.

## 2024-06-29 ENCOUNTER — Telehealth: Payer: Self-pay | Admitting: Physician Assistant

## 2024-06-29 NOTE — Telephone Encounter (Signed)
 Pt's husband Norleen  called this afternoon and stated that Pt is talking, but it is not  making sense. John wants to see if something can be prescribe to pt to calm her down. Pt is not walking either. Please call. Thanks

## 2024-06-29 NOTE — Telephone Encounter (Signed)
 Na now at 3:54pm

## 2024-06-29 NOTE — Telephone Encounter (Signed)
 I left message on machine, patient needs to be seen if medical treatment needed now to contact the PCP or ED.

## 2024-06-29 NOTE — Telephone Encounter (Signed)
 Diana Smith has left message as well Needs to be seen for an appt, or ER visit for acute change.

## 2024-07-11 ENCOUNTER — Other Ambulatory Visit: Payer: Self-pay | Admitting: Adult Health

## 2024-07-11 NOTE — Telephone Encounter (Signed)
 Patient is requesting a refill of the following medications: Requested Prescriptions   Pending Prescriptions Disp Refills   ALPRAZolam  (XANAX ) 0.5 MG tablet [Pharmacy Med Name: ALPRAZolam  0.5 MG Oral Tablet] 90 tablet 0    Sig: TAKE 1 TABLET BY MOUTH THREE TIMES DAILY AS NEEDED FOR ANXIETY    Date of last refill: 06/11/2024  Refill amount: 90 tabs 0 refills  Treatment agreement date: no agreement on file to report.

## 2024-07-12 ENCOUNTER — Other Ambulatory Visit: Payer: Self-pay | Admitting: Adult Health

## 2024-07-29 DEATH — deceased
